# Patient Record
Sex: Female | Born: 1950 | Race: White | Hispanic: No | Smoking: Never smoker
Health system: Southern US, Community
[De-identification: ages and names within clinical notes are randomized; demographics above are authoritative.]

## PROBLEM LIST (undated history)

## (undated) DIAGNOSIS — Z9889 Other specified postprocedural states: Secondary | ICD-10-CM

## (undated) DIAGNOSIS — F0390 Unspecified dementia without behavioral disturbance: Secondary | ICD-10-CM

## (undated) DIAGNOSIS — R112 Nausea with vomiting, unspecified: Secondary | ICD-10-CM

## (undated) DIAGNOSIS — I10 Essential (primary) hypertension: Secondary | ICD-10-CM

## (undated) DIAGNOSIS — E785 Hyperlipidemia, unspecified: Secondary | ICD-10-CM

---

## 2020-03-23 ENCOUNTER — Other Ambulatory Visit: Payer: Self-pay

## 2020-03-23 ENCOUNTER — Encounter (HOSPITAL_COMMUNITY): Payer: Self-pay | Admitting: Emergency Medicine

## 2020-03-23 ENCOUNTER — Emergency Department (HOSPITAL_COMMUNITY)
Admission: EM | Admit: 2020-03-23 | Discharge: 2020-03-29 | Disposition: A | Discharge status: Other Acute Inpatient Hospital | Payer: Medicare HMO | Attending: Emergency Medicine | Admitting: Emergency Medicine

## 2020-03-23 DIAGNOSIS — F69 Unspecified disorder of adult personality and behavior: Secondary | ICD-10-CM | POA: Diagnosis not present

## 2020-03-23 DIAGNOSIS — F329 Major depressive disorder, single episode, unspecified: Secondary | ICD-10-CM | POA: Insufficient documentation

## 2020-03-23 DIAGNOSIS — Z20822 Contact with and (suspected) exposure to covid-19: Secondary | ICD-10-CM | POA: Insufficient documentation

## 2020-03-23 DIAGNOSIS — R110 Nausea: Secondary | ICD-10-CM | POA: Insufficient documentation

## 2020-03-23 DIAGNOSIS — R451 Restlessness and agitation: Secondary | ICD-10-CM | POA: Insufficient documentation

## 2020-03-23 DIAGNOSIS — R413 Other amnesia: Secondary | ICD-10-CM | POA: Diagnosis not present

## 2020-03-23 LAB — COMPREHENSIVE METABOLIC PANEL
ALT: 18 U/L (ref 0–44)
AST: 25 U/L (ref 15–41)
Albumin: 4.2 g/dL (ref 3.5–5.0)
Alkaline Phosphatase: 91 U/L (ref 38–126)
Anion gap: 7 (ref 5–15)
BUN: 14 mg/dL (ref 8–23)
CO2: 24 mmol/L (ref 22–32)
Calcium: 9 mg/dL (ref 8.9–10.3)
Chloride: 108 mmol/L (ref 98–111)
Creatinine, Ser: 0.88 mg/dL (ref 0.44–1.00)
GFR calc Af Amer: >60 mL/min (ref >60)
GFR calc non Af Amer: >60 mL/min (ref >60)
Glucose, Bld: 173 mg/dL — ABNORMAL HIGH (ref 70–99)
Potassium: 3.5 mmol/L (ref 3.5–5.1)
Sodium: 139 mmol/L (ref 135–145)
Total Bilirubin: 0.9 mg/dL (ref 0.3–1.2)
Total Protein: 7.6 g/dL (ref 6.5–8.1)

## 2020-03-23 LAB — CBC WITH DIFFERENTIAL/PLATELET
Abs Immature Granulocytes: 0.02 10*3/uL (ref 0.00–0.07)
Basophils Absolute: 0.1 10*3/uL (ref 0.0–0.1)
Basophils Relative: 1 %
Eosinophils Absolute: 0.1 10*3/uL (ref 0.0–0.5)
Eosinophils Relative: 2 %
HCT: 40.8 % (ref 36.0–46.0)
Hemoglobin: 12.7 g/dL (ref 12.0–15.0)
Immature Granulocytes: 0 %
Lymphocytes Relative: 14 %
Lymphs Abs: 1.2 10*3/uL (ref 0.7–4.0)
MCH: 28.4 pg (ref 26.0–34.0)
MCHC: 31.1 g/dL (ref 30.0–36.0)
MCV: 91.3 fL (ref 80.0–100.0)
Monocytes Absolute: 0.3 10*3/uL (ref 0.1–1.0)
Monocytes Relative: 4 %
Neutro Abs: 6.7 10*3/uL (ref 1.7–7.7)
Neutrophils Relative %: 79 %
Platelets: 323 10*3/uL (ref 150–400)
RBC: 4.47 MIL/uL (ref 3.87–5.11)
RDW: 14 % (ref 11.5–15.5)
WBC: 8.5 10*3/uL (ref 4.0–10.5)
nRBC: 0 % (ref 0.0–0.2)

## 2020-03-23 LAB — URINALYSIS, ROUTINE W REFLEX MICROSCOPIC
Bacteria, UA: NONE SEEN
Bilirubin Urine: NEGATIVE
Glucose, UA: NEGATIVE mg/dL
Ketones, ur: NEGATIVE mg/dL
Nitrite: NEGATIVE
Protein, ur: NEGATIVE mg/dL
Specific Gravity, Urine: 1.008 (ref 1.005–1.030)
pH: 6 (ref 5.0–8.0)

## 2020-03-23 LAB — RAPID URINE DRUG SCREEN, HOSP PERFORMED
Amphetamines: NOT DETECTED
Barbiturates: NOT DETECTED
Benzodiazepines: NOT DETECTED
Cocaine: NOT DETECTED
Opiates: NOT DETECTED
Tetrahydrocannabinol: NOT DETECTED

## 2020-03-23 LAB — ETHANOL: Alcohol, Ethyl (B): <10 mg/dL (ref <10)

## 2020-03-23 MED ORDER — LORAZEPAM 1 MG PO TABS
1.0000 mg | ORAL_TABLET | Freq: Once | ORAL | Status: AC
  Administered 2020-03-23: 1 mg via ORAL
  Filled 2020-03-23: qty 1

## 2020-03-23 MED ORDER — BACITRACIN ZINC 500 UNIT/GM EX OINT
TOPICAL_OINTMENT | Freq: Two times a day (BID) | CUTANEOUS | Status: DC
  Administered 2020-03-25 – 2020-03-29 (×6): 1 via TOPICAL
  Filled 2020-03-23 (×7): qty 0.9

## 2020-03-23 NOTE — ED Notes (Signed)
Pt ambulatory from triage 

## 2020-03-23 NOTE — ED Provider Notes (Signed)
[facility] DEPT Provider Note   CSN: [id number] Arrival date & time: 03/23/20  1122     History Chief Complaint  Patient presents with  . Memory Loss    Nancy [name] is a 69 y.o. female.  HPI       69 year old female, with PMH of depression, presents due to behavioral problems.  Per son at bedside patient has had behavioral problems for the last 9 months.  She has been seen and evaluated for this multiple times.  There is concern for dementia.  Patient was seen by neurology and told that her symptoms were secondary to depression.  Per son patient was on multiple medications including citalopram, trazodone, Zyprexa.  All of these medications have been stopped due to patient "throwing them out."  Son notes that they were having difficulty eating care to the patient so she was placed in assisted living.  However she has left the facility twice in the last 24 hours stating that she wants to see her son.  Patient denies any SI, HI.  Patient denies any medical complaints.  Concern for patient safety by son secondary to patient continuing to leave facility.   History reviewed. No pertinent past medical history.  There are no problems to display for this patient.   History reviewed. No pertinent surgical history.   OB History   No obstetric history on file.     No family history on file.  Social History   Tobacco Use  . Smoking status: Not on file  Substance Use Topics  . Alcohol use: Never  . Drug use: Never    Home Medications Prior to Admission medications   Not on File    Allergies    Patient has no allergy information on record.  Review of Systems   Review of Systems  Constitutional: Negative for chills and fever.  Respiratory: Negative for shortness of breath.   Cardiovascular: Negative for chest pain.  Gastrointestinal: Negative for abdominal pain, nausea and vomiting.  All other systems reviewed and are  negative.   Physical Exam Updated Vital Signs BP (!) 141/73   Pulse 91   Temp 97.9 F (36.6 C) (Oral)   Resp 16   Ht 5' 7.5" (1.715 m)   Wt 54.4 kg   SpO2 99%   BMI 18.52 kg/m   Physical Exam Vitals and nursing note reviewed.  Constitutional:      Appearance: She is well-developed.  HENT:     Head: Normocephalic and atraumatic.  Eyes:     Conjunctiva/sclera: Conjunctivae normal.  Cardiovascular:     Rate and Rhythm: Normal rate and regular rhythm.     Heart sounds: Normal heart sounds. No murmur.  Pulmonary:     Effort: Pulmonary effort is normal. No respiratory distress.     Breath sounds: Normal breath sounds. No wheezing or rales.  Abdominal:     General: Bowel sounds are normal. There is no distension.     Palpations: Abdomen is soft.     Tenderness: There is no abdominal tenderness.  Musculoskeletal:        General: No tenderness or deformity. Normal range of motion.     Cervical back: Neck supple.  Skin:    General: Skin is warm and dry.     Findings: No erythema or rash.       Neurological:     Mental Status: She is alert.     Comments: Oriented to person and place  Psychiatric:  Attention and Perception: Attention normal.        Mood and Affect: Mood normal.        Speech: Speech normal.        Behavior: Behavior is agitated. Behavior is cooperative.     ED Results / Procedures / Treatments   Labs (all labs ordered are listed, but only abnormal results are displayed) Labs Reviewed  RESPIRATORY PANEL BY RT PCR (FLU A&B, COVID)  RAPID URINE DRUG SCREEN, HOSP PERFORMED  COMPREHENSIVE METABOLIC PANEL  ETHANOL  CBC WITH DIFFERENTIAL/PLATELET    EKG None  Radiology No results found.  Procedures Procedures (including critical care time)  Medications Ordered in ED Medications  bacitracin ointment (has no administration in time range)    ED Course  I have reviewed the triage vital signs and the nursing notes.  Pertinent labs &  imaging results that were available during my care of the patient were reviewed by me and considered in my medical decision making (see chart for details).    MDM Rules/Calculators/A&P                       Presents with son for behavior problems.  Son states patient has been walking out into the street leaving nursing facility.  Patient denies any complaints.  Blood work unremarkable.  UDS negative.  UA with leukocyte esterase and hemoglobin present.  Patient denies any urinary symptoms however will send for culture.  TTS consult placed after blood work is resulted.  Multiple attempts made to get a hold of TTS.  After 6 hours they are evaluating patient.  She is medically stable.  Patient placed in psych hold.   The patient has been placed in psychiatric observation due to the need to provide a safe environment for the patient while obtaining psychiatric consultation and evaluation, as well as ongoing medical and medication management to treat the patient's condition.  The patient has not been placed under full IVC at this time.    Final Clinical Impression(s) / ED Diagnoses Final diagnoses:  None    Rx / DC Orders ED Discharge Orders    None       Nancy [name] 03/23/20 2353    Nancy Munch, MD 03/27/20 0040

## 2020-03-23 NOTE — ED Notes (Signed)
Family requesting to speak to PA. PA made aware

## 2020-03-23 NOTE — ED Triage Notes (Signed)
Per pt, states she is "out of sorts"-forgetful and walking out of house with no reason-states her son told her to come to ED and be "checked out"

## 2020-03-23 NOTE — ED Notes (Signed)
Attempted to call TTS for update- no answer.

## 2020-03-23 NOTE — ED Notes (Signed)
I have called 4 different numbers for TTS without answer. Pt is constantly getting up and wandering. She is directable.

## 2020-03-23 NOTE — ED Notes (Signed)
Family at bedside. 

## 2020-03-24 LAB — RESPIRATORY PANEL BY RT PCR (FLU A&B, COVID)
Influenza A by PCR: NEGATIVE
Influenza B by PCR: NEGATIVE
SARS Coronavirus 2 by RT PCR: NEGATIVE

## 2020-03-24 MED ORDER — ZIPRASIDONE MESYLATE 20 MG IM SOLR
20.0000 mg | INTRAMUSCULAR | Status: AC | PRN
  Administered 2020-03-25: 20 mg via INTRAMUSCULAR
  Filled 2020-03-24: qty 20

## 2020-03-24 MED ORDER — TRAZODONE HCL 50 MG PO TABS
50.0000 mg | ORAL_TABLET | Freq: Every day | ORAL | Status: DC
  Administered 2020-03-24 – 2020-03-28 (×5): 50 mg via ORAL
  Filled 2020-03-24 (×5): qty 1

## 2020-03-24 MED ORDER — ACETAMINOPHEN 325 MG PO TABS: 650.0000 mg | ORAL_TABLET | ORAL | Status: DC | PRN

## 2020-03-24 MED ORDER — IBUPROFEN 200 MG PO TABS: 400.0000 mg | ORAL_TABLET | Freq: Four times a day (QID) | ORAL | Status: DC | PRN

## 2020-03-24 MED ORDER — ZOLPIDEM TARTRATE 5 MG PO TABS: 2.5000 mg | ORAL_TABLET | Freq: Every evening | ORAL | Status: DC | PRN

## 2020-03-24 MED ORDER — ONDANSETRON HCL 4 MG PO TABS
4.0000 mg | ORAL_TABLET | Freq: Three times a day (TID) | ORAL | Status: DC | PRN
  Administered 2020-03-24 – 2020-03-26 (×2): 4 mg via ORAL
  Filled 2020-03-24 (×2): qty 1

## 2020-03-24 MED ORDER — RISPERIDONE 1 MG PO TBDP
2.0000 mg | ORAL_TABLET | Freq: Three times a day (TID) | ORAL | Status: DC | PRN
  Administered 2020-03-24 – 2020-03-29 (×4): 2 mg via ORAL
  Filled 2020-03-24 (×4): qty 2

## 2020-03-24 MED ORDER — DONEPEZIL HCL 5 MG PO TABS
10.0000 mg | ORAL_TABLET | Freq: Every day | ORAL | Status: DC
  Administered 2020-03-24 – 2020-03-28 (×5): 10 mg via ORAL
  Filled 2020-03-24 (×6): qty 2

## 2020-03-24 MED ORDER — LORAZEPAM 1 MG PO TABS
1.0000 mg | ORAL_TABLET | ORAL | Status: AC | PRN
  Administered 2020-03-26: 1 mg via ORAL
  Filled 2020-03-24: qty 1

## 2020-03-24 NOTE — ED Notes (Signed)
Pt is very confused and restless.  She keeps trying to walk in hallway and also tries to leave.

## 2020-03-24 NOTE — ED Notes (Signed)
Patient wanded and security placed belongings in patient locker.

## 2020-03-24 NOTE — BH Assessment (Signed)
[facility] Assessment Progress Note  Per Berneice Heinrich, FNP, this pt does not require psychiatric hospitalization at this time.  Pt is psychiatrically cleared.  Behavioral health referrals are not indicated for this pt at this time.  A social work consult has been ordered to address pt's psychosocial needs.  Pt's nurse, Addison Naegeli, has been notified.  Doylene Canning, MA Triage Specialist [phone]

## 2020-03-24 NOTE — NC FL2 (Signed)
  [place] MEDICAID FL2 LEVEL OF CARE SCREENING TOOL     IDENTIFICATION  Patient Name: Nancy [name] Birthdate: February 28, 1951 Sex: female Admission Date (Current Location): 03/23/2020  [facility] and [place] Number:  Producer, television/film/video and Address:  [facility],  [address] [place] [address]      Provider Number: [id number]  Attending Physician Name and Address:  Default, Provider, MD  Relative Name and Phone Number:  NEKESHIA LENHARDT [facility]) [phone]    Current Level of Care: Hospital Recommended Level of Care: Assisted Living Facility, Memory Care Prior Approval Number:    Date Approved/Denied:   PASRR Number:    Discharge Plan: Other (Comment)(ALF/memory care)    Current Diagnoses: There are no problems to display for this patient.   Orientation RESPIRATION BLADDER Height & Weight     Self(flucuates due to dementia)  Normal Continent Weight: 120 lb (54.4 kg) Height:  5' 7.5" (171.5 cm)  BEHAVIORAL SYMPTOMS/MOOD NEUROLOGICAL BOWEL NUTRITION STATUS      Continent Diet(regular)  AMBULATORY STATUS COMMUNICATION OF NEEDS Skin   Supervision Verbally Normal                       Personal Care Assistance Level of Assistance  Bathing, Feeding, Dressing Bathing Assistance: Independent Feeding assistance: Independent Dressing Assistance: Independent     Functional Limitations Info  Sight, Hearing, Speech Sight Info: Adequate Hearing Info: Adequate Speech Info: Adequate    SPECIAL CARE FACTORS FREQUENCY                       Contractures Contractures Info: Not present    Additional Factors Info  Code Status, Allergies Code Status Info: Full Allergies Info: Cefdinir           Current Medications (03/24/2020):  This is the current hospital active medication list Current Facility-Administered Medications  Medication Dose Route Frequency Provider Last Rate Last Admin  . bacitracin ointment   Topical BID Clayborne Artist, [place]    Given at 03/23/20 2231   Current Outpatient Medications  Medication Sig Dispense Refill  . donepezil (ARICEPT) 10 MG tablet Take 10 mg by mouth at bedtime.    Marland Kitchen ibuprofen (ADVIL) 400 MG tablet Take 400 mg by mouth every 6 (six) hours as needed for fever, headache, mild pain, moderate pain or cramping.     . traZODone (DESYREL) 50 MG tablet Take 50 mg by mouth at bedtime.       Discharge Medications: Please see discharge summary for a list of discharge medications.  Relevant Imaging Results:  Relevant Lab Results:   Additional Information SS# [id number]  Montine Circle, LCSW

## 2020-03-24 NOTE — Consult Note (Signed)
[facility] Psych ED Progress Note  03/24/2020 2:32 PM Nancy [name]  MRN:  [id number] Subjective:   Patient seen by nurse practitioner along with Dr Dwyane Dee. Patient alert, oriented to self only at this time. Patient pleasant and cooperative, attempts to participate in evaluation as current level of cognition allows.  Patient states "I would like to go wherever my son Catalina Antigua wants me to be." Patient suggest this writer contact her son to discuss disposition.  Social work spoke with patient's son who endorses difficulty caring for patient as she wanders from his home.  Patient recently placed in independent living exhibiting wandering behaviors from this facility as well.  Principal Problem: <principal problem not specified> Diagnosis:  Active Problems:   * No active hospital problems. *  Total Time spent with patient: 30 minutes  Past Psychiatric History: Depression  Past Medical History: History reviewed. No pertinent past medical history. History reviewed. No pertinent surgical history. Family History: No family history on file. Family Psychiatric  History: Unknown Social History:  Social History   Substance and Sexual Activity  Alcohol Use Never     Social History   Substance and Sexual Activity  Drug Use Never    Social History   Socioeconomic History  . Marital status: Single    Spouse name: Not on file  . Number of children: Not on file  . Years of education: Not on file  . Highest education level: Not on file  Occupational History  . Not on file  Tobacco Use  . Smoking status: Not on file  Substance and Sexual Activity  . Alcohol use: Never  . Drug use: Never  . Sexual activity: Not on file  Other Topics Concern  . Not on file  Social History Narrative  . Not on file   Social Determinants of Health   Financial Resource Strain:   . Difficulty of Paying Living Expenses:   Food Insecurity:   . Worried About Charity fundraiser in the Last Year:   . Arboriculturist  in the Last Year:   Transportation Needs:   . Film/video editor (Medical):   Marland Kitchen Lack of Transportation (Non-Medical):   Physical Activity:   . Days of Exercise per Week:   . Minutes of Exercise per Session:   Stress:   . Feeling of Stress :   Social Connections:   . Frequency of Communication with Friends and Family:   . Frequency of Social Gatherings with Friends and Family:   . Attends Religious Services:   . Active Member of Clubs or Organizations:   . Attends Archivist Meetings:   Marland Kitchen Marital Status:     Sleep: Fair  Appetite:  Fair  Current Medications: Current Facility-Administered Medications  Medication Dose Route Frequency Provider Last Rate Last Admin  . bacitracin ointment   Topical BID Etter Sjogren, [place]   Given at 03/24/20 1100   Current Outpatient Medications  Medication Sig Dispense Refill  . donepezil (ARICEPT) 10 MG tablet Take 10 mg by mouth at bedtime.    Marland Kitchen ibuprofen (ADVIL) 400 MG tablet Take 400 mg by mouth every 6 (six) hours as needed for fever, headache, mild pain, moderate pain or cramping.     . traZODone (DESYREL) 50 MG tablet Take 50 mg by mouth at bedtime.      Lab Results:  Results for orders placed or performed during the hospital encounter of 03/23/20 (from the past 48 hour(s))  Comprehensive metabolic panel  Status: Abnormal   Collection Time: 03/23/20  4:30 PM  Result Value Ref Range   Sodium 139 135 - 145 mmol/L   Potassium 3.5 3.5 - 5.1 mmol/L   Chloride 108 98 - 111 mmol/L   CO2 24 22 - 32 mmol/L   Glucose, Bld 173 (H) 70 - 99 mg/dL    Comment: Glucose reference range applies only to samples taken after fasting for at least 8 hours.   BUN 14 8 - 23 mg/dL   Creatinine, Ser 7.56 0.44 - 1.00 mg/dL   Calcium 9.0 8.9 - 43.3 mg/dL   Total Protein 7.6 6.5 - 8.1 g/dL   Albumin 4.2 3.5 - 5.0 g/dL   AST 25 15 - 41 U/L   ALT 18 0 - 44 U/L   Alkaline Phosphatase 91 38 - 126 U/L   Total Bilirubin 0.9 0.3 - 1.2 mg/dL    GFR calc non Af Amer >60 >60 mL/min   GFR calc Af Amer >60 >60 mL/min   Anion gap 7 5 - 15    Comment: Performed at [facility], [address] [place], [place] [address]  Ethanol     Status: None   Collection Time: 03/23/20  4:30 PM  Result Value Ref Range   Alcohol, Ethyl (B) <10 <10 mg/dL    Comment: (NOTE) Lowest detectable limit for serum alcohol is 10 mg/dL. For medical purposes only. Performed at [facility], [address], [place], [place] [address]   CBC with Diff     Status: None   Collection Time: 03/23/20  4:30 PM  Result Value Ref Range   WBC 8.5 4.0 - 10.5 K/uL   RBC 4.47 3.87 - 5.11 MIL/uL   Hemoglobin 12.7 12.0 - 15.0 g/dL   HCT 06.3 01.6 - 01.0 %   MCV 91.3 80.0 - 100.0 fL   MCH 28.4 26.0 - 34.0 pg   MCHC 31.1 30.0 - 36.0 g/dL   RDW 93.2 35.5 - 73.2 %   Platelets 323 150 - 400 K/uL   nRBC 0.0 0.0 - 0.2 %   Neutrophils Relative % 79 %   Neutro Abs 6.7 1.7 - 7.7 K/uL   Lymphocytes Relative 14 %   Lymphs Abs 1.2 0.7 - 4.0 K/uL   Monocytes Relative 4 %   Monocytes Absolute 0.3 0.1 - 1.0 K/uL   Eosinophils Relative 2 %   Eosinophils Absolute 0.1 0.0 - 0.5 K/uL   Basophils Relative 1 %   Basophils Absolute 0.1 0.0 - 0.1 K/uL   Immature Granulocytes 0 %   Abs Immature Granulocytes 0.02 0.00 - 0.07 K/uL    Comment: Performed at [facility], [address] [place], [place] [address]  Urinalysis, Routine w reflex microscopic     Status: Abnormal   Collection Time: 03/23/20  4:48 PM  Result Value Ref Range   Color, Urine YELLOW YELLOW   APPearance CLEAR CLEAR   Specific Gravity, Urine 1.008 1.005 - 1.030   pH 6.0 5.0 - 8.0   Glucose, UA NEGATIVE NEGATIVE mg/dL   Hgb urine dipstick MODERATE (A) NEGATIVE   Bilirubin Urine NEGATIVE NEGATIVE   Ketones, ur NEGATIVE NEGATIVE mg/dL   Protein, ur NEGATIVE NEGATIVE mg/dL   Nitrite NEGATIVE NEGATIVE   Leukocytes,Ua SMALL (A) NEGATIVE   RBC / HPF 0-5 0  - 5 RBC/hpf   WBC, UA 11-20 0 - 5 WBC/hpf   Bacteria, UA NONE SEEN NONE SEEN   Squamous Epithelial /  LPF 0-5 0 - 5    Comment: Performed at [facility], [address], [place], [place] [address]  Urine rapid drug screen (hosp performed)     Status: None   Collection Time: 03/23/20  4:56 PM  Result Value Ref Range   Opiates NONE DETECTED NONE DETECTED   Cocaine NONE DETECTED NONE DETECTED   Benzodiazepines NONE DETECTED NONE DETECTED   Amphetamines NONE DETECTED NONE DETECTED   Tetrahydrocannabinol NONE DETECTED NONE DETECTED   Barbiturates NONE DETECTED NONE DETECTED    Comment: (NOTE) DRUG SCREEN FOR MEDICAL PURPOSES ONLY.  IF CONFIRMATION IS NEEDED FOR ANY PURPOSE, NOTIFY LAB WITHIN 5 DAYS. LOWEST DETECTABLE LIMITS FOR URINE DRUG SCREEN Drug Class                     Cutoff (ng/mL) Amphetamine and metabolites    1000 Barbiturate and metabolites    200 Benzodiazepine                 200 Tricyclics and metabolites     300 Opiates and metabolites        300 Cocaine and metabolites        300 THC                            50 Performed at [facility], [address] [place], [place] [address]   Respiratory Panel by RT PCR (Flu A&B, Covid) - Nasopharyngeal Swab     Status: None   Collection Time: 03/24/20  5:07 AM   Specimen: Nasopharyngeal Swab  Result Value Ref Range   SARS Coronavirus 2 by RT PCR NEGATIVE NEGATIVE    Comment: (NOTE) SARS-CoV-2 target nucleic acids are NOT DETECTED. The SARS-CoV-2 RNA is generally detectable in upper respiratoy specimens during the acute phase of infection. The lowest concentration of SARS-CoV-2 viral copies this assay can detect is 131 copies/mL. A negative result does not preclude SARS-Cov-2 infection and should not be used as the sole basis for treatment or other patient management decisions. A negative result may occur with  improper specimen collection/handling, submission of specimen  other than nasopharyngeal swab, presence of viral mutation(s) within the areas targeted by this assay, and inadequate number of viral copies (<131 copies/mL). A negative result must be combined with clinical observations, patient history, and epidemiological information. The expected result is Negative. Fact Sheet for Patients:  https://www.moore.com/ Fact Sheet for Healthcare Providers:  https://www.young.biz/ This test is not yet ap proved or cleared by the [place] FDA and  has been authorized for detection and/or diagnosis of SARS-CoV-2 by FDA under an Emergency Use Authorization (EUA). This EUA will remain  in effect (meaning this test can be used) for the duration of the COVID-19 declaration under Section 564(b)(1) of the Act, 21 U.S.C. section 360bbb-3(b)(1), unless the authorization is terminated or revoked sooner.    Influenza A by PCR NEGATIVE NEGATIVE   Influenza B by PCR NEGATIVE NEGATIVE    Comment: (NOTE) The Xpert Xpress SARS-CoV-2/FLU/RSV assay is intended as an aid in  the diagnosis of influenza from Nasopharyngeal swab specimens and  should not be used as a sole basis for treatment. Nasal washings and  aspirates are unacceptable for Xpert Xpress SARS-CoV-2/FLU/RSV  testing. Fact Sheet for Patients: https://www.moore.com/ Fact Sheet for Healthcare Providers: https://www.young.biz/ This test is not yet approved or cleared by the [place] FDA and  has been authorized for detection  and/or diagnosis of SARS-CoV-2 by  FDA under an Emergency Use Authorization (EUA). This EUA will remain  in effect (meaning this test can be used) for the duration of the  Covid-19 declaration under Section 564(b)(1) of the Act, 21  U.S.C. section 360bbb-3(b)(1), unless the authorization is  terminated or revoked. Performed at [facility], [address] [place], [place] [address]      Blood Alcohol level:  Lab Results  Component Value Date   ETH <10 03/23/2020    Physical Findings: AIMS:  , ,  ,  ,    CIWA:    COWS:     Musculoskeletal: Strength & Muscle Tone: within normal limits Gait & Station: unble to assess Patient leans: N/A  Psychiatric Specialty Exam: Physical Exam Vitals and nursing note reviewed.  Constitutional:      Appearance: She is well-developed.  HENT:     Head: Normocephalic.  Cardiovascular:     Rate and Rhythm: Normal rate.  Pulmonary:     Effort: Pulmonary effort is normal.  Neurological:     Mental Status: She is alert. She is disoriented.  Psychiatric:        Mood and Affect: Mood normal.        Behavior: Behavior normal.        Thought Content: Thought content normal.     Review of Systems  Constitutional: Negative.   HENT: Negative.   Eyes: Negative.   Respiratory: Negative.   Cardiovascular: Negative.   Gastrointestinal: Negative.   Genitourinary: Negative.   Musculoskeletal: Negative.   Skin: Negative.   Neurological: Negative.     Blood pressure 119/63, pulse 81, temperature 97.9 F (36.6 C), temperature source Oral, resp. rate 16, height 5' 7.5" (1.715 m), weight 54.4 kg, SpO2 98 %.Body mass index is 18.52 kg/m.  General Appearance: Casual and Fairly Groomed  Eye Contact:  Fair  Speech:  Clear and Coherent and Normal Rate  Volume:  Normal  Mood:  Euthymic  Affect:  Congruent  Thought Process:  Goal Directed and Descriptions of Associations: Intact  Orientation:  Other:  self only  Thought Content:  Logical  Suicidal Thoughts:  No  Homicidal Thoughts:  No  Memory:  Immediate;   Poor Recent;   Poor Remote;   Poor  Judgement:  Impaired  Insight:  Lacking  Psychomotor Activity:  Normal  Concentration:  Concentration: Fair and Attention Span: Fair  Recall:  Poor  Fund of Knowledge:  Fair  Language:  Good  Akathisia:  No  Handed:  Right  AIMS (if indicated):     Assets:  Communication  Skills Desire for Improvement Financial Resources/Insurance Housing Intimacy Leisure Time Physical Health Resilience Social Support  ADL's:  Intact  Cognition:  Impaired,  Moderate  Sleep:         Treatment Plan Summary: Plan to clear by psychiatry. Social work to attempt to secure appropriate placement.   Patrcia Dolly, FNP 03/24/2020, 2:32 PM

## 2020-03-24 NOTE — TOC Initial Note (Signed)
Transition of Care [facility]) - Initial/Assessment Note    Patient Details  Name: Nancy [name] MRN: [id number] Date of Birth: [birth year]  Transition of Care [facility]) CM/SW Contact:    Montine Circle, LCSW Phone Number: 03/24/2020, 1:20 PM  Clinical Narrative:                 [facility] consult received. Per TTS patient has been psych cleared. CSW spoke with patient's son Susy Frizzle, who reports he is hoping to get his mother in to an ALF. He reports he has already been in contact with [facility] and reports they need an FL2 and other clinical information. Susy Frizzle also reports he would like to get his mother in a safe place as she has had several episodes of wandering within the past week. Matt reports he was living at an independent living apartment, but wandered off and she was [place] police [place] the [place] on two [place]. Due to safety concerns she is not able to return there. He also reports since living with him patient has also tried to wander as well.   CSW made contact with [facility] and will send over referral information.    Expected Discharge Plan: Assisted Living Barriers to Discharge: No Barriers Identified   Patient Goals and CMS Choice Patient states their goals for this hospitalization and ongoing recovery are:: Per son "to have my mom somewhere safe."      Expected Discharge Plan and Services Expected Discharge Plan: Assisted Living In-house Referral: Clinical Social Work     Living arrangements for the past 2 months: Independent Living Facility                                      Prior Living Arrangements/Services Living arrangements for the past 2 months: Independent Living Facility Lives with:: Self Patient language and need for interpreter reviewed:: Yes Do you feel safe going back to the place where you live?: Yes      Need for Family Participation in Patient Care: Yes (Comment) Care giver support system in place?: Yes (comment)   Criminal  Activity/Legal Involvement Pertinent to Current Situation/Hospitalization: No - Comment as needed  Activities of Daily Living   ADL Screening (condition at time of admission) Patient's cognitive ability adequate to safely complete daily activities?: No Patient able to express need for assistance with ADLs?: No Independently performs ADLs?: Yes (appropriate for developmental age)  Permission Sought/Granted Permission sought to share information with : Facility Medical sales representative    Share Information with NAME: Ayde Record  Permission granted to share info w AGENCY: [employer]  Permission granted to share info w Relationship: son  Permission granted to share info w Contact Information: [phone]  Emotional Assessment Appearance:: Appears stated age Attitude/Demeanor/Rapport: Unable to Assess Affect (typically observed): Unable to Assess Orientation: : Fluctuating Orientation (Suspected and/or reported Sundowners) Alcohol / Substance Use: Not Applicable Psych Involvement: No (comment)  Admission diagnosis:  AMS There are no problems to display for this patient.  PCP:  System, Pcp Not In Pharmacy:   [id number] - [address] [place] [address] Phone: [phone] Fax: [phone]     Social Determinants of Health (SDOH) Interventions    Readmission Risk Interventions No flowsheet data found.

## 2020-03-24 NOTE — ED Notes (Signed)
Pt began throwing up, Olivia NT Sitter, was in the room and helping with pt. RN notified.

## 2020-03-24 NOTE — ED Notes (Signed)
Patient items:  One watch, silver in color. One stackable ring, silver-colored and yellow.  One silver-colored ring.

## 2020-03-24 NOTE — ED Notes (Signed)
Pt clothes placed in patient belongings bag behind nurses station. Bag contains pt jacket, shirt, pants, shoes, and specimen bag with patients jewelry in it. Pt belonging bag placed behind nurses station.

## 2020-03-24 NOTE — BH Assessment (Signed)
Tele Assessment Note   Patient Name: Nancy [name] MRN: [id number] Referring Physician: Nonda Lou, PA Location of Patient: [facility] Location of Provider: [place] Department  Nancy [name] is an 69 y.o. female.  -Clinician reviewed note from [place], [place].  Pt is a 69 year old female, with PMH of depression, presents due to behavioral problems.  Per son at bedside patient has had behavioral problems for the last 9 months.  She has been seen and evaluated for this multiple times.  There is concern for dementia.  Patient was seen by neurology and told that her symptoms were secondary to depression.  Per son patient was on multiple medications including citalopram, trazodone, Zyprexa.  All of these medications have been stopped due to patient "throwing them out."  Son notes that they were having difficulty taking care of the patient so she was placed in assisted living.  However she has left the facility twice in the last 24 hours stating that she wants to see her son.  Patient denies any SI, HI.  Patient denies any medical complaints.  Concern for patient safety by son secondary to patient continuing to leave facility.    Clinician talked briefly with patient.  She was asked if she knew where she was, she didn't.  She thought that she was at a home in [place].  Patient was asked how she got to [facility] and she responded that "I walked here."  Pt is not oriented to time, place or situation.  Patient denies SI, HI or A/V hallucinations.  She has fair eye contact.  She does not appear to be responding to internal stimuli.  Patient thought process is not coherent.  Patient does is not able to make rational choices regarding her safety and well-being at this time.  Pt reportedly has been eloping from the facility she was placed in.  Clinician did get verbal permission from mother to contact her son Rodman Key.  Clinician did attempt to contact him but his voicemail was full.    -Based  on patient safety, Talbot Grumbling, NP recommended geropsych placement.  TTS to seek placement.    Diagnosis: Dementia  Past Medical History: History reviewed. No pertinent past medical history.  History reviewed. No pertinent surgical history.  Family History: No family history on file.  Social History:  reports that she does not drink alcohol or use drugs. No history on file for tobacco.  Additional Social History:  Alcohol / Drug Use Pain Medications: See PTA medication list Prescriptions: See PTA medication list Over the Counter: See PTA medication list History of alcohol / drug use?: No history of alcohol / drug abuse  CIWA: CIWA-Ar BP: (!) 141/73 Pulse Rate: 91 COWS:    Allergies:  Allergies  Allergen Reactions  . Cefdinir Diarrhea    Patient states this medication makes her feel weird as well as having diarrhea    Home Medications: (Not in a hospital admission)   OB/GYN Status:  No LMP recorded.  General Assessment Data Location of Assessment: [facility] TTS Assessment: In system Is this a Tele or Face-to-Face Assessment?: Tele Assessment Is this an Initial Assessment or a Re-assessment for this encounter?: Initial Assessment Patient Accompanied by:: N/A Language Other than English: No Living Arrangements: In Assisted Living/Nursing Home (Comment: Name of Nursing Home What gender do you identify as?: Female Marital status: Other (comment)(Unknown.) Pregnancy Status: No Living Arrangements: Other (Comment)(Pt is in an assisted living facility.) Can pt return to current living arrangement?: Yes(Unknown) Admission Status: Voluntary  Is patient capable of signing voluntary admission?: No Referral Source: Self/Family/Friend Insurance type: [employer]     Crisis Care Plan Living Arrangements: Other (Comment)(Pt is in an assisted living facility.) Name of Psychiatrist: No Name of Therapist: None  Education Status Is patient currently in school?: No Is the patient  employed, unemployed or receiving disability?: Unemployed(Retired)  Risk to self with the past 6 months Suicidal Ideation: No Has patient been a risk to self within the past 6 months prior to admission? : No Suicidal Intent: No Has patient had any suicidal intent within the past 6 months prior to admission? : No Is patient at risk for suicide?: No Suicidal Plan?: No Has patient had any suicidal plan within the past 6 months prior to admission? : No Access to Means: No What has been your use of drugs/alcohol within the last 12 months?: None Previous Attempts/Gestures: (Unknown) How many times?: (Unknown) Other Self Harm Risks: Dementia Triggers for Past Attempts: None known Intentional Self Injurious Behavior: None Family Suicide History: Unknown Recent stressful life event(s): Turmoil (Comment)(Pt moved into a facility recently.) Persecutory voices/beliefs?: No Depression: Yes Depression Symptoms: Despondent Substance abuse history and/or treatment for substance abuse?: No Suicide prevention information given to non-admitted patients: Not applicable  Risk to Others within the past 6 months Homicidal Ideation: No Does patient have any lifetime risk of violence toward others beyond the six months prior to admission? : No Thoughts of Harm to Others: No Current Homicidal Intent: No Current Homicidal Plan: No Access to Homicidal Means: No Identified Victim: No one History of harm to others?: No Assessment of Violence: None Noted Violent Behavior Description: Unknown Does patient have access to weapons?: No Criminal Charges Pending?: No Does patient have a court date: No Is patient on probation?: No  Psychosis Hallucinations: None noted Delusions: None noted  Mental Status Report Appearance/Hygiene: Unremarkable Eye Contact: Fair Motor Activity: Freedom of movement Speech: Incoherent Level of Consciousness: Alert Mood: Anxious Affect: Anxious Anxiety Level:  Minimal Thought Processes: Irrelevant, Flight of Ideas Judgement: Impaired Orientation: Not oriented Obsessive Compulsive Thoughts/Behaviors: None  Cognitive Functioning Concentration: Poor Memory: Recent Impaired, Remote Impaired Is patient IDD: No Insight: Poor Impulse Control: Poor Appetite: (UTA) Have you had any weight changes? : (UTA) Sleep: Unable to Assess Total Hours of Sleep: (Unknown) Vegetative Symptoms: None  ADLScreening [facility] Assessment Services) Patient's cognitive ability adequate to safely complete daily activities?: No Patient able to express need for assistance with ADLs?: No Independently performs ADLs?: Yes (appropriate for developmental age)  Prior Inpatient Therapy Prior Inpatient Therapy: (Unknown)  Prior Outpatient Therapy Prior Outpatient Therapy: (Unknown)  ADL Screening (condition at time of admission) Patient's cognitive ability adequate to safely complete daily activities?: No Patient able to express need for assistance with ADLs?: No Independently performs ADLs?: Yes (appropriate for developmental age)             Merchant navy officer (For Healthcare) Does Patient Have a Medical Advance Directive?: (Unknown.)          Disposition:  Disposition Initial Assessment Completed for this Encounter: Yes Patient referred to: Other (Comment)(TTS to seek placement.)  This service was provided via telemedicine using a 2-way, interactive audio and video technology.  Names of all persons participating in this telemedicine service and their role in this encounter. Name: Aiana Nordquist Role: patient  Name: Beatriz Stallion, M.S. LCAS QP Role: clinician  Name:  Role:   Name:  Role:     Alexandria Lodge 03/24/2020 12:43 AM

## 2020-03-24 NOTE — BH Assessment (Signed)
[facility] Assessment Progress Note   Clinician called pt's son, Molli Hazard.  Unable to leave a message due to voicemail being full.  Informed nurse [place] that pt had been seen and attempt made to contact family.

## 2020-03-25 LAB — URINE CULTURE: Culture: NO GROWTH

## 2020-03-25 MED ORDER — FAMOTIDINE 20 MG PO TABS
20.0000 mg | ORAL_TABLET | Freq: Two times a day (BID) | ORAL | Status: DC
  Administered 2020-03-25 – 2020-03-29 (×6): 20 mg via ORAL
  Filled 2020-03-25 (×6): qty 1

## 2020-03-25 NOTE — Progress Notes (Signed)
03/25/2020  1736  Per patient's son please give Trazodone with food. Pt has been know to vomit if Trazodone is given on a empty stomach.

## 2020-03-25 NOTE — ED Notes (Signed)
Patient constantly walking out of her room demending to leave and unable to direct.  Refused to take PO med.  Geodon IM given. Patient resting comfortably, sitter in sight.

## 2020-03-25 NOTE — ED Notes (Signed)
Patient is up and asking acid reflux med.

## 2020-03-26 MED ORDER — STERILE WATER FOR INJECTION IJ SOLN
INTRAMUSCULAR | Status: AC
  Administered 2020-03-26: 1.2 mL
  Filled 2020-03-26: qty 10

## 2020-03-26 MED ORDER — ZIPRASIDONE MESYLATE 20 MG IM SOLR
10.0000 mg | Freq: Once | INTRAMUSCULAR | Status: AC
  Administered 2020-03-26: 10 mg via INTRAMUSCULAR
  Filled 2020-03-26: qty 20

## 2020-03-26 NOTE — Progress Notes (Signed)
03/26/2020  1500  Patient is agitated. Throwing lotion bottles at staff, cursing at staff, trying to leave room.

## 2020-03-26 NOTE — Progress Notes (Signed)
CSW continues to seeking ALF placement for patient. This CSW sent patient's clinical information to [facility] on 03/24/2020. CSW called to follow up on receipt of information. Information was received, but the Administrator does not work on the weekend. CSW will follow up on Monday 4/26.  Geralyn Corwin, LCSW Transitions of Care Department [facility] ED [phone]

## 2020-03-26 NOTE — Progress Notes (Signed)
03/26/2020  1804  Notified MD that patient has had three episodes of vomiting. 1st episode after eating breakfast, 2nd after seeing her lunch, and 3rd after eating a few bites of food. Waiting for response/order.

## 2020-03-26 NOTE — ED Provider Notes (Signed)
  Physical Exam  BP (!) 125/54 (BP Location: Left Arm)   Pulse 80   Temp 98 F (36.7 C) (Oral)   Resp 20   Ht 5' 7.5" (1.715 m)   Wt 54.4 kg   SpO2 98%   BMI 18.52 kg/m   Physical Exam  ED Course/Procedures     Procedures  MDM  Patient is become more agitated.  Will not take orals.  Will give Geodon.       Benjiman Core, MD 03/26/20 1440

## 2020-03-27 LAB — SARS CORONAVIRUS 2 (TAT 6-24 HRS): SARS Coronavirus 2: NEGATIVE

## 2020-03-27 MED ORDER — TUBERCULIN PPD 5 UNIT/0.1ML ID SOLN
5.0000 [IU] | INTRADERMAL | Status: AC
  Administered 2020-03-27: 5 [IU] via INTRADERMAL
  Filled 2020-03-27: qty 0.1

## 2020-03-27 MED ORDER — ONDANSETRON 4 MG PO TBDP
4.0000 mg | ORAL_TABLET | Freq: Three times a day (TID) | ORAL | Status: DC | PRN
  Administered 2020-03-27 – 2020-03-29 (×4): 4 mg via ORAL
  Filled 2020-03-27 (×4): qty 1

## 2020-03-27 NOTE — ED Provider Notes (Signed)
Patient has been psychiatrically cleared and still awaiting placement.  When I assessed the patient, nursing reports that she has been having some dry heaving with nausea for the last few days.  When I assessed the patient, I offered labs to look for dehydration or electrolyte imbalance, nausea medicine, and EKG.  Patient refused the labs and nausea medicine but did want EKG.  EKG did not show prolonged QTC.  Patient did not let me listen to her heart or lungs or assess her physically.  Externally, she was resting with her vomit bag in her hands.  She did not want me to assess her.  Otherwise patient is still awaiting placement.     Aravind Chrismer, Canary Brim, MD 03/27/20 [phone]

## 2020-03-27 NOTE — ED Notes (Signed)
Patient given Zofran, currently resting in bed quietly.

## 2020-03-27 NOTE — Progress Notes (Addendum)
1:20p  CSW spoke with Britta Mccreedy, Production designer, theatre/television/film at [facility] about patient. Britta Mccreedy reports they will complete an assessment with patient via Facetime today at 3p. CSW to facilitate this.   9:16a CSW contacted [facility] for an update on patient's referral. CSW left a message with the administrator and is currently waiting on a call back.   Geralyn Corwin, LCSW Transitions of Care Department [facility] ED [phone]

## 2020-03-27 NOTE — ED Notes (Signed)
Patient witnessed dry heaving this morning. Reports that she has been nauseous for 3 days. MD notified. Patient refused labs and nausea medication but did let writer get an EKG. [place] notified.

## 2020-03-27 NOTE — Progress Notes (Signed)
Assessment with [facility] completed. Patient can be accepted there Wednesday morning 03/29/2020. Patient will need a TB test and COVID test prior to discharge. CSW to make EDP and RN aware of needed orders.   CSW contacted patient's son and let him know that patient has been accepted to [facility]. Patient's son reports he plans on coming up here to visit patient today to talk with her about the transition. Patient's son to follow up with [facility] to sign needed paperwork.   Geralyn Corwin, LCSW Transitions of Care Department [facility] ED [phone]

## 2020-03-27 NOTE — Progress Notes (Signed)
Received Nancy [name] asleep in her bed at the change of shift, she continued to sleep throughout the evening. She was compliant with her VS check and medications. The sitter remains at the bedside throughout the night.

## 2020-03-28 NOTE — Progress Notes (Signed)
CSW confirmed with EDP a diagnosis of dementia could not be done from the ED as this is an outpatient process, usually from a neurologist, and that the FL-2 auto-populates from the chart that [facility] has access too.  CSW will continue to follow for D/C needs.  Dorothe Pea. Quanita Barona  MSW, LCSW, LCAS, CSI Transitions of Care Clinical Social Worker Care Coordination Department Ph: [phone]

## 2020-03-28 NOTE — ED Notes (Signed)
Patient reports being nauseous.

## 2020-03-28 NOTE — Progress Notes (Addendum)
CSW called pt's son who was the [facility] at the time of the CSW's call.  CSW spoke to pt's son who then put Nancy [name] the [facility] on the phone who states that the 1st shift CSW was told by admissions that in order for the pt to be accepted to [facility] ALF the pt would have to have a diagnosis of Dementia on the FL-2 and that this would the primary diagnosis.  The notes do not reflect that the 1st shift ED CSW was told this and the 2nd shift ED CSW told Nancy [name] the admin at the [facility] that.  Further, 2nd shift ED CSW stated that the FL-2 auto-populates from the chart and is merely a reflection of the chart and as such a diagnosis is not something that the CSW (1st or 2nd shift) could "add on to" the pt's chart.  CSW further stated that the pt's chart shows no current diagnosis of dementia and the notes from the chart only indicate a "concern for dementia" from the pt's son, per the note from the ED PA.  CSW stated to pt's son/[facility]F Admin he would confirm that with the pt's EDP.  CSW will continue to follow for D/C needs.  Nancy [name]  MSW, LCSW, LCAS, CSI Transitions of Care Clinical Social Worker Care Coordination Department Ph: [phone]

## 2020-03-28 NOTE — Progress Notes (Addendum)
2nd shift ED CSW received a handoff from the 1st shift [facility] ED CSW.   Pt's son is to have completed the paperwork at the [facility] ALF (see CSW note from 4/26) in order for the pt to be accepted at the [facility] ALF on the morning of Wed 03/29/20.  CSW at the request of the pt's son asked the EDP to review notes from [place] to look for a diagnosis there in their records.  EDP looked and saw a mention of dementia as a listed, "problem:, but not a diagnosis.  Further, there was no mention of a diagnosis of dementia in an office visit to a Neurologist but only a sentence reading that, "dementia cannot be ruled out".  CSW will continue to follow for D/C needs.  Dorothe Pea. Graylon Amory  MSW, LCSW, LCAS, CSI Transitions of Care Clinical Social Worker Care Coordination Department Ph: [phone]

## 2020-03-28 NOTE — ED Provider Notes (Signed)
Emergency Medicine Observation Re-evaluation Note  Nancy [name] is a 69 y.o. female, seen on rounds today.  Pt initially presented to the ED for complaints of Memory Loss Currently, the patient is to be placed today.  Physical Exam  BP (!) 145/66 (BP Location: Left Arm)   Pulse 91   Temp 97.9 F (36.6 C) (Oral)   Resp 17   Ht 1.715 m (5' 7.5")   Wt 54.4 kg   SpO2 96%   BMI 18.52 kg/m  Physical Exam  ED Course / MDM  EKG:EKG Interpretation  Date/Time:  Monday March 27 2020 09:50:29 EDT Ventricular Rate:  83 PR Interval:  156 QRS Duration: 84 QT Interval:  374 QTC Calculation: 439 R Axis:   75 Text Interpretation: Normal sinus rhythm Normal ECG No prior ECG for comparison. NO STEMI Confirmed by Theda Belfast ([id number]) on 03/27/2020 11:04:16 AM    I have reviewed the labs performed to date as well as medications administered while in observation.  Recent changes in the last 24 hours include none. Plan  Current plan is for placement. Patient is not under full IVC at this time.   Lorre Nick, MD 03/28/20 1023

## 2020-03-28 NOTE — ED Notes (Signed)
Spoke with Christiane Ha CSW regarding plan for pt. He has been made aware no update has been added to chart since yesterday. Christiane Ha will follow up and let CN know POC.

## 2020-03-28 NOTE — ED Notes (Signed)
Patient pleasant at this time, sitting in chair by bed

## 2020-03-28 NOTE — Progress Notes (Signed)
CSW updated pt's son who stated he can get the pt's needed FL-2 (with dementia diagnosis) from pt's PCP.  CSW will continue to follow for D/C needs.  Dorothe Pea. Ivianna Notch  MSW, LCSW, LCAS, CSI Transitions of Care Clinical Social Worker Care Coordination Department Ph: [phone]

## 2020-03-29 NOTE — Progress Notes (Signed)
03/29/2020  1448  Read TB skin test: Neg result 71mm

## 2020-03-29 NOTE — ED Provider Notes (Addendum)
Emergency Medicine Observation Re-evaluation Note  Nancy [name] is a 69 y.o. female, seen on rounds today.  Pt initially presented to the ED for complaints of Memory Loss Currently, the patient is awaiting placement. No complaints from the nursing staff.  Physical Exam  BP 140/66 (BP Location: Right Arm)   Pulse 75   Temp 98 F (36.7 C) (Oral)   Resp 17   Ht 5' 7.5" (1.715 m)   Wt 54.4 kg   SpO2 95%   BMI 18.52 kg/m  Physical Exam Vitals and nursing note reviewed.  HENT:     Head: Atraumatic.  Cardiovascular:     Rate and Rhythm: Normal rate.  Pulmonary:     Effort: Pulmonary effort is normal.  Skin:    Capillary Refill: Capillary refill takes less than 2 seconds.  Neurological:     Mental Status: Mental status is at baseline.     ED Course / MDM  EKG:EKG Interpretation  Date/Time:  Monday March 27 2020 09:50:29 EDT Ventricular Rate:  83 PR Interval:  156 QRS Duration: 84 QT Interval:  374 QTC Calculation: 439 R Axis:   75 Text Interpretation: Normal sinus rhythm Normal ECG No prior ECG for comparison. NO STEMI Confirmed by Theda Belfast ([id number]) on 03/27/2020 11:04:16 AM    I have reviewed the labs performed to date as well as medications administered while in observation.  Recent changes in the last 24 hours include : None. Plan  Current plan is for continue with placement. Patient is under full IVC at this time.   Derwood Kaplan, MD 03/29/20 0736   3:17 PM Patient's evaluation has been completed.  TTS recommends discharge to facility.   Derwood Kaplan, MD 03/29/20 1517

## 2020-03-29 NOTE — Progress Notes (Addendum)
2:19p CSW spoke with patient's son who reports the documentation for patient has been completed and faxed to [facility]. Patient's son on his way to pick up patient.   CSW spoke with Britta Mccreedy at [facility] who confirmed that she did receive the FL2 from the PCP and patient can be admitted. CSW faxed over COVID results and TB reading to [facility]. EDP and RN aware of patient's discharge to [facility].  10:30a CSW spoke with patient's son who reports patient's PCP will complete the documentation for the dementia dx. He reports this should be done by noon. Matt reports he will come pick patient up.   9:06a CSW spoke with patient's son, Susy Frizzle, regarding getting documentation of patient's dementia dx from her PCP office. Matt reports he contacted the PCP this morning and is awaiting a response from them. CSW will continue to follow up.  Geralyn Corwin, LCSW Transitions of Care Department [facility] ED [phone]

## 2020-08-30 ENCOUNTER — Emergency Department (HOSPITAL_COMMUNITY): Payer: Medicare HMO

## 2020-08-30 ENCOUNTER — Other Ambulatory Visit: Payer: Self-pay

## 2020-08-30 ENCOUNTER — Emergency Department (HOSPITAL_COMMUNITY)
Admission: EM | Admit: 2020-08-30 | Discharge: 2020-08-30 | Disposition: A | Discharge status: Skilled Nursing Facility | Payer: Medicare HMO | Attending: Emergency Medicine | Admitting: Emergency Medicine

## 2020-08-30 DIAGNOSIS — S42291A Other displaced fracture of upper end of right humerus, initial encounter for closed fracture: Secondary | ICD-10-CM | POA: Diagnosis not present

## 2020-08-30 DIAGNOSIS — S199XXA Unspecified injury of neck, initial encounter: Secondary | ICD-10-CM | POA: Diagnosis not present

## 2020-08-30 DIAGNOSIS — R05 Cough: Secondary | ICD-10-CM | POA: Diagnosis not present

## 2020-08-30 DIAGNOSIS — W1839XA Other fall on same level, initial encounter: Secondary | ICD-10-CM | POA: Diagnosis not present

## 2020-08-30 DIAGNOSIS — S62101A Fracture of unspecified carpal bone, right wrist, initial encounter for closed fracture: Secondary | ICD-10-CM

## 2020-08-30 DIAGNOSIS — S4991XA Unspecified injury of right shoulder and upper arm, initial encounter: Secondary | ICD-10-CM | POA: Diagnosis present

## 2020-08-30 DIAGNOSIS — S42294A Other nondisplaced fracture of upper end of right humerus, initial encounter for closed fracture: Secondary | ICD-10-CM

## 2020-08-30 DIAGNOSIS — S0990XA Unspecified injury of head, initial encounter: Secondary | ICD-10-CM | POA: Diagnosis not present

## 2020-08-30 LAB — CBC WITH DIFFERENTIAL/PLATELET
Abs Immature Granulocytes: 0.06 10*3/uL (ref 0.00–0.07)
Basophils Absolute: 0 10*3/uL (ref 0.0–0.1)
Basophils Relative: 0 %
Eosinophils Absolute: 0.1 10*3/uL (ref 0.0–0.5)
Eosinophils Relative: 0 %
HCT: 34.3 % — ABNORMAL LOW (ref 36.0–46.0)
Hemoglobin: 11.1 g/dL — ABNORMAL LOW (ref 12.0–15.0)
Immature Granulocytes: 0 %
Lymphocytes Relative: 8 %
Lymphs Abs: 1.1 10*3/uL (ref 0.7–4.0)
MCH: 28.8 pg (ref 26.0–34.0)
MCHC: 32.4 g/dL (ref 30.0–36.0)
MCV: 88.9 fL (ref 80.0–100.0)
Monocytes Absolute: 0.6 10*3/uL (ref 0.1–1.0)
Monocytes Relative: 4 %
Neutro Abs: 12.4 10*3/uL — ABNORMAL HIGH (ref 1.7–7.7)
Neutrophils Relative %: 88 %
Platelets: 254 10*3/uL (ref 150–400)
RBC: 3.86 MIL/uL — ABNORMAL LOW (ref 3.87–5.11)
RDW: 13.6 % (ref 11.5–15.5)
WBC: 14.3 10*3/uL — ABNORMAL HIGH (ref 4.0–10.5)
nRBC: 0 % (ref 0.0–0.2)

## 2020-08-30 LAB — BASIC METABOLIC PANEL
Anion gap: 7 (ref 5–15)
BUN: 14 mg/dL (ref 8–23)
CO2: 21 mmol/L — ABNORMAL LOW (ref 22–32)
Calcium: 7.9 mg/dL — ABNORMAL LOW (ref 8.9–10.3)
Chloride: 111 mmol/L (ref 98–111)
Creatinine, Ser: 0.78 mg/dL (ref 0.44–1.00)
GFR calc Af Amer: >60 mL/min (ref >60)
GFR calc non Af Amer: >60 mL/min (ref >60)
Glucose, Bld: 116 mg/dL — ABNORMAL HIGH (ref 70–99)
Potassium: 3.4 mmol/L — ABNORMAL LOW (ref 3.5–5.1)
Sodium: 139 mmol/L (ref 135–145)

## 2020-08-30 MED ORDER — FENTANYL CITRATE (PF) 100 MCG/2ML IJ SOLN
50.0000 ug | Freq: Once | INTRAMUSCULAR | Status: AC
  Administered 2020-08-30: 50 ug via INTRAVENOUS
  Filled 2020-08-30: qty 2

## 2020-08-30 NOTE — Discharge Instructions (Addendum)
follow up with orthopaedics about humerus fracture/wrist fracture. Please keep in sling, and do not get the splint wet.  Dont bear any weight with right arm.  Recommend Tylenol Motrin for pain.  Suspect nonoperative management.  Follow-up with orthopedics in a week and call them for appointment.

## 2020-08-30 NOTE — ED Triage Notes (Signed)
Pt. Arrived by Ems A&Ox1 (baseline) Mechanical fall  No LOC/ No blood thinners. Pt. was hypotensive. Ems put in a 20G Lhand, 1 liter of fluid. Complaining of right shoulder pain and diarrhea .

## 2020-08-30 NOTE — ED Notes (Signed)
[facility] called for transport.  

## 2020-08-30 NOTE — ED Provider Notes (Signed)
[facility]-EMERGENCY DEPT Provider Note   CSN: [id number] Arrival date & time: 08/30/20  0175     History Chief Complaint  Patient presents with  . Fall    Nancy [name] is a 69 y.o. female.  The history is provided by the patient.  Fall This is a new problem. The problem occurs rarely. The problem has been resolved. Pertinent negatives include no chest pain, no abdominal pain, no headaches and no shortness of breath. Associated symptoms comments: Right shoulder, wrist. Nothing aggravates the symptoms. Nothing relieves the symptoms. She has tried nothing for the symptoms.       No past medical history on file.  There are no problems to display for this patient.   No past surgical history on file.   OB History   No obstetric history on file.     No family history on file.  Social History   Tobacco Use  . Smoking status: Not on file  Substance Use Topics  . Alcohol use: Never  . Drug use: Never    Home Medications Prior to Admission medications   Medication Sig Start Date End Date Taking? Authorizing Provider  citalopram (CELEXA) 20 MG tablet Take 20 mg by mouth daily. 07/20/20  Yes [provider]  ibuprofen (ADVIL) 400 MG tablet Take 400 mg by mouth every 6 (six) hours as needed for fever, headache, mild pain, moderate pain or cramping.  02/03/20  Yes [provider]  OLANZapine (ZYPREXA) 10 MG tablet Take 10 mg by mouth at bedtime. 08/17/20  Yes [provider]  PRESCRIPTION MEDICATION Take 1 each by mouth in the morning, at noon, and at bedtime. Mighty Shakes   Yes [provider]    Allergies    Cefdinir  Review of Systems   Review of Systems  Constitutional: Negative for chills and fever.  HENT: Negative for ear pain and sore throat.   Eyes: Negative for pain and visual disturbance.  Respiratory: Negative for cough and shortness of breath.   Cardiovascular: Negative for chest pain and  palpitations.  Gastrointestinal: Negative for abdominal pain and vomiting.  Genitourinary: Negative for dysuria and hematuria.  Musculoskeletal: Positive for arthralgias. Negative for back pain.  Skin: Negative for color change and rash.  Neurological: Negative for seizures, syncope and headaches.  All other systems reviewed and are negative.   Physical Exam Updated Vital Signs  ED Triage Vitals [08/30/20 0700]  Enc Vitals Group     BP 116/68     Pulse Rate 93     Resp 18     Temp 97.9 F (36.6 C)     Temp Source Oral     SpO2 98 %     Weight      Height      Head Circumference      Peak Flow      Pain Score      Pain Loc      Pain Edu?      Excl. in GC?     Physical Exam Vitals and nursing note reviewed.  Constitutional:      General: She is not in acute distress.    Appearance: She is well-developed. She is not ill-appearing.  HENT:     Head: Normocephalic and atraumatic.     Nose: Nose normal.     Mouth/Throat:     Mouth: Mucous membranes are moist.  Eyes:     Extraocular Movements: Extraocular movements intact.     Conjunctiva/sclera:  Conjunctivae normal.     Pupils: Pupils are equal, round, and reactive to light.  Cardiovascular:     Rate and Rhythm: Normal rate and regular rhythm.     Pulses: Normal pulses.     Heart sounds: Normal heart sounds. No murmur heard.   Pulmonary:     Effort: Pulmonary effort is normal. No respiratory distress.     Breath sounds: Normal breath sounds.  Abdominal:     Palpations: Abdomen is soft.     Tenderness: There is no abdominal tenderness.  Musculoskeletal:        General: Tenderness (TTP right shoulder, right wrist) present.     Cervical back: Normal range of motion and neck supple. No tenderness.     Comments: Pain with ROM of right shoulder   Skin:    General: Skin is warm and dry.     Capillary Refill: Capillary refill takes less than 2 seconds.  Neurological:     General: No focal deficit present.     Mental  Status: She is alert.     Cranial Nerves: No cranial nerve deficit.     Sensory: No sensory deficit.     Motor: No weakness.  Psychiatric:        Mood and Affect: Mood normal.     ED Results / Procedures / Treatments   Labs (all labs ordered are listed, but only abnormal results are displayed) Labs Reviewed  CBC WITH DIFFERENTIAL/PLATELET - Abnormal; Notable for the following components:      Result Value   WBC 14.3 (*)    RBC 3.86 (*)    Hemoglobin 11.1 (*)    HCT 34.3 (*)    Neutro Abs 12.4 (*)    All other components within normal limits  BASIC METABOLIC PANEL - Abnormal; Notable for the following components:   Potassium 3.4 (*)    CO2 21 (*)    Glucose, Bld 116 (*)    Calcium 7.9 (*)    All other components within normal limits    EKG None  Radiology DG Chest 2 View  Result Date: 08/30/2020 CLINICAL DATA:  Cough RIGHT shoulder and wrist pain post fall EXAM: CHEST - 2 VIEW COMPARISON:  None FINDINGS: Image rotated to the LEFT. Accounting for this cardiomediastinal contours and hilar structures are normal. No consolidation or sign of effusion. Osteopenia. Impacted fracture of the RIGHT humeral neck better seen on dedicated humeral and shoulder radiographs. IMPRESSION: Impacted fracture of the RIGHT humeral neck with comminution better seen on dedicated radiographs. No acute cardiopulmonary disease. Electronically Signed   By: Donzetta KohutGeoffrey  Wile M.D.   On: 08/30/2020 08:28   DG Pelvis 1-2 Views  Result Date: 08/30/2020 CLINICAL DATA:  RIGHT shoulder and RIGHT wrist pain post fall EXAM: PELVIS - 1-2 VIEW COMPARISON:  None FINDINGS: There is no evidence of pelvic fracture or diastasis. No pelvic bone lesions are seen. IMPRESSION: Negative. Electronically Signed   By: Donzetta KohutGeoffrey  Wile M.D.   On: 08/30/2020 08:33   DG Shoulder Right  Result Date: 08/30/2020 CLINICAL DATA:  Right shoulder pain after fall EXAM: RIGHT SHOULDER - 2+ VIEW; RIGHT HUMERUS - 2+ VIEW COMPARISON:  None.  FINDINGS: Comminuted fracture of the proximal right humerus with mildly impacted and anteriorly displaced component through the surgical neck. Nondisplaced component involves the greater tuberosity. Alignment of the glenohumeral joint appears maintained without dislocation. AC joint intact. Limited view of the elbow joint reveals no gross deformity. There is soft tissue swelling at the fracture  site. IMPRESSION: Comminuted mildly displaced fracture of the proximal right humerus involving the surgical neck and greater tuberosity. Electronically Signed   By: Duanne Guess D.O.   On: 08/30/2020 08:25   DG Wrist Complete Right  Result Date: 08/30/2020 CLINICAL DATA:  RIGHT shoulder and RIGHT wrist pain post fall EXAM: RIGHT WRIST - COMPLETE 3+ VIEW COMPARISON:  None. FINDINGS: Osteopenia. Impacted dorsally angulated and comminuted fracture of the distal radius extending into the radioulnar joint and radiocarpal articulation. Mild dorsal tilt of the radial articular surface. No additional fracture. IMPRESSION: 1. Impacted, comminuted and dorsally angulated fracture of the distal radius extending into the radioulnar joint and radiocarpal articulation. 2. Osteopenia. Electronically Signed   By: Donzetta Kohut M.D.   On: 08/30/2020 08:26   CT Head Wo Contrast  Result Date: 08/30/2020 CLINICAL DATA:  Head trauma, minor. Neck trauma. Additional history provided: Fall, pain to head and neck. EXAM: CT HEAD WITHOUT CONTRAST CT CERVICAL SPINE WITHOUT CONTRAST TECHNIQUE: Multidetector CT imaging of the head and cervical spine was performed following the standard protocol without intravenous contrast. Multiplanar CT image reconstructions of the cervical spine were also generated. COMPARISON:  No pertinent prior exams are available for comparison. FINDINGS: CT HEAD FINDINGS Brain: Mild generalized cerebral atrophy. Mild ill-defined hypoattenuation within the cerebral white matter is nonspecific, but consistent with  chronic small vessel ischemic disease. There is no acute intracranial hemorrhage. No demarcated cortical infarct. No extra-axial fluid collection. No evidence of intracranial mass. No midline shift. Vascular: No hyperdense vessel. Skull: Normal. Negative for fracture or focal lesion. Sinuses/Orbits: Visualized orbits show no acute finding. Mild ethmoid and maxillary sinus mucosal thickening. No significant mastoid effusion. CT CERVICAL SPINE FINDINGS Alignment: Straightening of the expected cervical lordosis. Trace C2-C3 grade 1 retrolisthesis. Trace C4-C5 grade 1 anterolisthesis. Trace C5-C6 grade 1 retrolisthesis. Skull base and vertebrae: The basion-dental and atlanto-dental intervals are maintained.No evidence of acute fracture to the cervical spine. Soft tissues and spinal canal: No prevertebral fluid or swelling. No visible canal hematoma. Disc levels: Cervical spondylosis with multilevel disc space narrowing, posterior disc osteophytes, uncovertebral and facet hypertrophy. Disc space narrowing is moderate/advanced at C5-C6 and C6-C7. Posterior disc osteophytes at C5-C6 and C6-C7 contribute to at least moderate bony spinal canal stenosis. Multilevel bony neural foraminal narrowing. Upper chest: No consolidation within the imaged lung apices. No visible pneumothorax. IMPRESSION: CT head: 1. No evidence of acute intracranial abnormality. 2. Mild generalized cerebral atrophy and chronic small vessel ischemic disease. 3. Mild ethmoid and maxillary sinus mucosal thickening. CT cervical spine: 1. No evidence of acute fracture to the cervical spine. 2. Trace multilevel spondylolisthesis as detailed. 3. Cervical spondylosis as described and greatest at C5-C6 and C6-C7. Electronically Signed   By: Jackey Loge DO   On: 08/30/2020 08:04   CT Cervical Spine Wo Contrast  Result Date: 08/30/2020 CLINICAL DATA:  Head trauma, minor. Neck trauma. Additional history provided: Fall, pain to head and neck. EXAM: CT HEAD  WITHOUT CONTRAST CT CERVICAL SPINE WITHOUT CONTRAST TECHNIQUE: Multidetector CT imaging of the head and cervical spine was performed following the standard protocol without intravenous contrast. Multiplanar CT image reconstructions of the cervical spine were also generated. COMPARISON:  No pertinent prior exams are available for comparison. FINDINGS: CT HEAD FINDINGS Brain: Mild generalized cerebral atrophy. Mild ill-defined hypoattenuation within the cerebral white matter is nonspecific, but consistent with chronic small vessel ischemic disease. There is no acute intracranial hemorrhage. No demarcated cortical infarct. No extra-axial fluid collection. No evidence of  intracranial mass. No midline shift. Vascular: No hyperdense vessel. Skull: Normal. Negative for fracture or focal lesion. Sinuses/Orbits: Visualized orbits show no acute finding. Mild ethmoid and maxillary sinus mucosal thickening. No significant mastoid effusion. CT CERVICAL SPINE FINDINGS Alignment: Straightening of the expected cervical lordosis. Trace C2-C3 grade 1 retrolisthesis. Trace C4-C5 grade 1 anterolisthesis. Trace C5-C6 grade 1 retrolisthesis. Skull base and vertebrae: The basion-dental and atlanto-dental intervals are maintained.No evidence of acute fracture to the cervical spine. Soft tissues and spinal canal: No prevertebral fluid or swelling. No visible canal hematoma. Disc levels: Cervical spondylosis with multilevel disc space narrowing, posterior disc osteophytes, uncovertebral and facet hypertrophy. Disc space narrowing is moderate/advanced at C5-C6 and C6-C7. Posterior disc osteophytes at C5-C6 and C6-C7 contribute to at least moderate bony spinal canal stenosis. Multilevel bony neural foraminal narrowing. Upper chest: No consolidation within the imaged lung apices. No visible pneumothorax. IMPRESSION: CT head: 1. No evidence of acute intracranial abnormality. 2. Mild generalized cerebral atrophy and chronic small vessel ischemic  disease. 3. Mild ethmoid and maxillary sinus mucosal thickening. CT cervical spine: 1. No evidence of acute fracture to the cervical spine. 2. Trace multilevel spondylolisthesis as detailed. 3. Cervical spondylosis as described and greatest at C5-C6 and C6-C7. Electronically Signed   By: Jackey Loge DO   On: 08/30/2020 08:04   DG Humerus Right  Result Date: 08/30/2020 CLINICAL DATA:  Right shoulder pain after fall EXAM: RIGHT SHOULDER - 2+ VIEW; RIGHT HUMERUS - 2+ VIEW COMPARISON:  None. FINDINGS: Comminuted fracture of the proximal right humerus with mildly impacted and anteriorly displaced component through the surgical neck. Nondisplaced component involves the greater tuberosity. Alignment of the glenohumeral joint appears maintained without dislocation. AC joint intact. Limited view of the elbow joint reveals no gross deformity. There is soft tissue swelling at the fracture site. IMPRESSION: Comminuted mildly displaced fracture of the proximal right humerus involving the surgical neck and greater tuberosity. Electronically Signed   By: Duanne Guess D.O.   On: 08/30/2020 08:25    Procedures Procedures (including critical care time)  Medications Ordered in ED Medications  fentaNYL (SUBLIMAZE) injection 50 mcg (has no administration in time range)    ED Course  I have reviewed the triage vital signs and the nursing notes.  Pertinent labs & imaging results that were available during my care of the patient were reviewed by me and considered in my medical decision making (see chart for details).    MDM Rules/Calculators/A&P                          Nancy [name] is a 69 year old female with history of dementia who presents to the ED after mechanical fall.  Patient with unremarkable vitals.  No fever.  Blood pressure was in the 90s with EMS so they gave her a liter of fluid.  She has had some diarrhea.  Complaining mostly of right shoulder and right wrist pain.  Does not know if she lost  consciousness.  She is not on blood thinners.  She appears neurologically intact.  No obvious deformity to the right shoulder but she is tender at the proximal humerus and right wrist.  We will get images of the shoulder, wrist, head, neck, chest, pelvis.  Will check basic labs given recent diarrhea to make sure no electrolyte abnormalities or AKI.  Patient already got fluid bolus.  Patient with comminuted right wrist fracture, fracture of proximal humerus.  Talked with Dr. Ave Filter with orthopedics  will place patient in a sling with a sugar tong splint for the rest.  Will follow up in 1 week.  Suspect nonoperative treatment.  Lab work overall unremarkable.  Discharge back to facility.  This chart was dictated using voice recognition software.  Despite best efforts to proofread,  errors can occur which can change the documentation meaning.    Final Clinical Impression(s) / ED Diagnoses Final diagnoses:  Closed fracture of right wrist, initial encounter  Other closed nondisplaced fracture of proximal end of right humerus, initial encounter    Rx / DC Orders ED Discharge Orders    None       Virgina Norfolk, DO 08/30/20 4098

## 2021-05-26 ENCOUNTER — Emergency Department (HOSPITAL_BASED_OUTPATIENT_CLINIC_OR_DEPARTMENT_OTHER): Payer: Medicare HMO

## 2021-05-26 ENCOUNTER — Emergency Department (HOSPITAL_BASED_OUTPATIENT_CLINIC_OR_DEPARTMENT_OTHER)
Admission: EM | Admit: 2021-05-26 | Discharge: 2021-05-26 | Disposition: A | Discharge status: Home / Self Care | Payer: Medicare HMO | Attending: Emergency Medicine | Admitting: Emergency Medicine

## 2021-05-26 ENCOUNTER — Encounter (HOSPITAL_BASED_OUTPATIENT_CLINIC_OR_DEPARTMENT_OTHER): Payer: Self-pay

## 2021-05-26 DIAGNOSIS — Z23 Encounter for immunization: Secondary | ICD-10-CM | POA: Insufficient documentation

## 2021-05-26 DIAGNOSIS — S01111A Laceration without foreign body of right eyelid and periocular area, initial encounter: Secondary | ICD-10-CM | POA: Insufficient documentation

## 2021-05-26 DIAGNOSIS — F0390 Unspecified dementia without behavioral disturbance: Secondary | ICD-10-CM | POA: Diagnosis not present

## 2021-05-26 DIAGNOSIS — S0990XA Unspecified injury of head, initial encounter: Secondary | ICD-10-CM

## 2021-05-26 DIAGNOSIS — W19XXXA Unspecified fall, initial encounter: Secondary | ICD-10-CM | POA: Diagnosis not present

## 2021-05-26 DIAGNOSIS — S0181XA Laceration without foreign body of other part of head, initial encounter: Secondary | ICD-10-CM

## 2021-05-26 MED ORDER — LIDOCAINE HCL (PF) 1 % IJ SOLN
5.0000 mL | Freq: Once | INTRAMUSCULAR | Status: AC
  Administered 2021-05-26: 5 mL
  Filled 2021-05-26: qty 5

## 2021-05-26 MED ORDER — TETANUS-DIPHTH-ACELL PERTUSSIS 5-2.5-18.5 LF-MCG/0.5 IM SUSY
0.5000 mL | PREFILLED_SYRINGE | Freq: Once | INTRAMUSCULAR | Status: AC
  Administered 2021-05-26: 0.5 mL via INTRAMUSCULAR
  Filled 2021-05-26: qty 0.5

## 2021-05-26 NOTE — Discharge Instructions (Addendum)
The sutures are absorbable but can be removed in 3 to 5 days.

## 2021-05-26 NOTE — ED Notes (Signed)
Pt assisted up to bathroom. Incont of urine in her brief. incont care provided and brief changed. Assisted back to bed. Tolerated well.

## 2021-05-26 NOTE — ED Notes (Addendum)
Patient from skilled Nursing facility.  Report unwitnessed fall substaning laceration above right eye brow.  About 1 inch in length.

## 2021-05-26 NOTE — ED Notes (Signed)
Handoff report given to [place] at [facility].  Patient to transport back to facility via POV with son.

## 2021-05-26 NOTE — ED Provider Notes (Signed)
[facility] EMERGENCY DEPT Provider Note   CSN: [id number] Arrival date & time: 05/26/21  1646     History Chief Complaint  Patient presents with   Fall    Lac. At right eyebrow    Nancy [name] is a 70 y.o. female.   Fall Level 5 caveat due to dementia.  From skilled nursing.  Reportedly fell.  Unsure what happened.  Approximately 2 cm laceration above right eye.  Patient only complaining of that laceration.  Denies other pain but does not know what happened.  Not on anticoagulation.  Unknown last tetanus.     No past medical history on file.  There are no problems to display for this patient.   No past surgical history on file.   OB History   No obstetric history on file.     No family history on file.  Social History   Substance Use Topics   Alcohol use: Never   Drug use: Never    Home Medications Prior to Admission medications   Medication Sig Start Date End Date Taking? Authorizing Provider  citalopram (CELEXA) 20 MG tablet Take 20 mg by mouth daily. 07/20/20   [provider]  ibuprofen (ADVIL) 400 MG tablet Take 400 mg by mouth every 6 (six) hours as needed for fever, headache, mild pain, moderate pain or cramping.  02/03/20   [provider]  OLANZapine (ZYPREXA) 10 MG tablet Take 10 mg by mouth at bedtime. 08/17/20   [provider]  PRESCRIPTION MEDICATION Take 1 each by mouth in the morning, at noon, and at bedtime. Mighty Shakes    [provider]    Allergies    Cefdinir  Review of Systems   Review of Systems  Unable to perform ROS: Dementia   Physical Exam Updated Vital Signs BP 136/68 (BP Location: Right Arm)   Pulse 85   Temp 98.6 F (37 C) (Oral)   Resp 16   SpO2 99%   Physical Exam Vitals and nursing note reviewed.  HENT:     Head:     Comments: Approximately 2 cm laceration above right eye horizontally.  Eye movements intact. Eyes:     Extraocular Movements: Extraocular  movements intact.  Cardiovascular:     Rate and Rhythm: Regular rhythm.  Pulmonary:     Breath sounds: No wheezing or rhonchi.  Chest:     Chest wall: No tenderness.  Abdominal:     Tenderness: There is no abdominal tenderness.  Musculoskeletal:        General: No tenderness.     Cervical back: Neck supple.  Skin:    General: Skin is warm.     Capillary Refill: Capillary refill takes less than 2 seconds.  Neurological:     Mental Status: She is alert. Mental status is at baseline.    ED Results / Procedures / Treatments   Labs (all labs ordered are listed, but only abnormal results are displayed) Labs Reviewed - No data to display  EKG None  Radiology No results found.  Procedures .Marland KitchenLaceration Repair  Date/Time: 05/26/2021 7:50 PM Performed by: Benjiman Core, MD Authorized by: Benjiman Core, MD   Consent:    Consent obtained:  Verbal   Consent given by:  Patient   Risks, benefits, and alternatives were discussed: yes     Risks discussed:  Infection, need for additional repair, nerve damage and retained foreign body   Alternatives discussed:  No treatment Anesthesia:    Anesthesia method:  Local infiltration   Local anesthetic:  Lidocaine 1% w/o epi Laceration details:    Location:  Face   Face location:  R eyebrow   Length (cm):  2 Pre-procedure details:    Preparation:  Patient was prepped and draped in usual sterile fashion and imaging obtained to evaluate for foreign bodies Exploration:    Limited defect created (wound extended): no     Hemostasis achieved with:  Direct pressure   Wound exploration: wound explored through full range of motion     Contaminated: no   Treatment:    Area cleansed with:  Shur-Clens   Amount of cleaning:  Standard   Visualized foreign bodies/material removed: no     Scar revision: no   Skin repair:    Repair method:  Sutures   Suture size:  5-0   Wound skin closure material used: vicryl rapide.   Suture technique:   Simple interrupted   Number of sutures:  6 Approximation:    Approximation:  Close Repair type:    Repair type:  Simple Post-procedure details:    Dressing:  Sterile dressing   Procedure completion:  Tolerated well, no immediate complications   Medications Ordered in ED Medications  lidocaine (PF) (XYLOCAINE) 1 % injection 5 mL (has no administration in time range)  Tdap (BOOSTRIX) injection 0.5 mL (has no administration in time range)    ED Course  I have reviewed the triage vital signs and the nursing notes.  Pertinent labs & imaging results that were available during my care of the patient were reviewed by me and considered in my medical decision making (see chart for details).    MDM Rules/Calculators/A&P                          Patient presented after a fall at the memory care unit.  Unsure exactly what happened but only complaint is laceration on her head.  Motor cheek with no underlying bony tenderness.  Wound closed.  Head CT reassuring.  Discussed with patient and her son.  Appears at baseline.  Discharge home with son back to [place] [facility]. Final Clinical Impression(s) / ED Diagnoses Final diagnoses:  None    Rx / DC Orders ED Discharge Orders     None        Benjiman Core, MD 05/26/21 2308

## 2021-05-26 NOTE — ED Triage Notes (Signed)
She presented to the nurse's station at [facility], telling the staff that "I fell". They noted her to have a right eyebrow area lac. And called EMS. She is awake, alert and confused at her baseline.

## 2021-06-19 ENCOUNTER — Encounter (HOSPITAL_BASED_OUTPATIENT_CLINIC_OR_DEPARTMENT_OTHER): Payer: Self-pay | Admitting: *Deleted

## 2021-06-19 ENCOUNTER — Other Ambulatory Visit: Payer: Self-pay

## 2021-06-19 ENCOUNTER — Emergency Department (HOSPITAL_BASED_OUTPATIENT_CLINIC_OR_DEPARTMENT_OTHER)
Admission: EM | Admit: 2021-06-19 | Discharge: 2021-06-19 | Disposition: A | Discharge status: Home / Self Care | Payer: Medicare HMO | Attending: Emergency Medicine | Admitting: Emergency Medicine

## 2021-06-19 ENCOUNTER — Emergency Department (HOSPITAL_BASED_OUTPATIENT_CLINIC_OR_DEPARTMENT_OTHER): Payer: Medicare HMO

## 2021-06-19 DIAGNOSIS — F0390 Unspecified dementia without behavioral disturbance: Secondary | ICD-10-CM | POA: Insufficient documentation

## 2021-06-19 DIAGNOSIS — S00212A Abrasion of left eyelid and periocular area, initial encounter: Secondary | ICD-10-CM | POA: Diagnosis not present

## 2021-06-19 DIAGNOSIS — I10 Essential (primary) hypertension: Secondary | ICD-10-CM | POA: Insufficient documentation

## 2021-06-19 DIAGNOSIS — S0990XA Unspecified injury of head, initial encounter: Secondary | ICD-10-CM

## 2021-06-19 DIAGNOSIS — W19XXXA Unspecified fall, initial encounter: Secondary | ICD-10-CM | POA: Diagnosis not present

## 2021-06-19 HISTORY — DX: Essential (primary) hypertension: I10

## 2021-06-19 HISTORY — DX: Unspecified dementia, unspecified severity, without behavioral disturbance, psychotic disturbance, mood disturbance, and anxiety: F03.90

## 2021-06-19 HISTORY — DX: Hyperlipidemia, unspecified: E78.5

## 2021-06-19 NOTE — ED Notes (Signed)
Pt given mac and cheese.  Left message on son's voice mail for permission to transport pt back to facility with safe transport.

## 2021-06-19 NOTE — ED Provider Notes (Signed)
[facility] EMERGENCY DEPT Provider Note   CSN: [id number] Arrival date & time: 06/19/21  1425     History Chief Complaint  Patient presents with   Fall   Dementia    Nancy [name] is a 70 y.o. female.  HPI   70 year old female presents to the ER from facility for unwitnessed fall and head injury. Patient has h/o dementia, amnesia, HTN, HLD. Report from facility is that patient was unattended briefly and a fall was heard. Patient found on the floor but awake, no s/s syncope or seizure. Patient sustained a head injury, small left eyebrow abrasion. History limited 2/2 dementia, she denies any complaints. Reported to be baseline per facility.   Past Medical History:  Diagnosis Date   Dementia (HCC)    Hyperlipidemia    Hypertension     There are no problems to display for this patient.   History reviewed. No pertinent surgical history.   OB History   No obstetric history on file.     No family history on file.  Social History   Substance Use Topics   Alcohol use: Never   Drug use: Never    Home Medications Prior to Admission medications   Medication Sig Start Date End Date Taking? Authorizing Provider  alum & mag hydroxide-simeth (MAALOX/MYLANTA) 200-200-20 MG/5ML suspension Take 30 mLs by mouth every 6 (six) hours as needed for indigestion or heartburn.   Yes [provider]  citalopram (CELEXA) 10 MG tablet Take 10 mg by mouth daily. 07/20/20  Yes [provider]  divalproex (DEPAKOTE) 125 MG DR tablet Take 125 mg by mouth daily.   Yes [provider]  guaifenesin (ROBITUSSIN) 100 MG/5ML syrup Take 200 mg by mouth 4 (four) times daily as needed for cough.   Yes [provider]  loperamide (IMODIUM) 2 MG capsule Take 2 mg by mouth as needed for diarrhea or loose stools. 1 cap as needed with loose stool/diarrhea,max 8 doses in 24 hrs   Yes [provider]  OLANZapine (ZYPREXA) 10 MG tablet Take 10 mg by  mouth daily. 08/17/20  Yes [provider]  PRESCRIPTION MEDICATION Take 1 each by mouth in the morning, at noon, and at bedtime. Mighty Shakes   Yes [provider]  ibuprofen (ADVIL) 400 MG tablet Take 400 mg by mouth every 6 (six) hours as needed for fever, headache, mild pain, moderate pain or cramping.  02/03/20   [provider]    Allergies    Cefdinir  Review of Systems   Review of Systems  Unable to perform ROS: Dementia   Physical Exam Updated Vital Signs BP 127/61   Pulse 82   Temp 97.9 F (36.6 C) (Oral)   Resp 15   Ht 5' 7.5" (1.715 m)   Wt 54.4 kg   LMP  (LMP Unknown)   SpO2 100%   BMI 18.52 kg/m   Physical Exam Vitals and nursing note reviewed.  Constitutional:      Appearance: Normal appearance.  HENT:     Head: Normocephalic.     Comments: Small superficial left eye brow abrasion    Mouth/Throat:     Mouth: Mucous membranes are moist.  Eyes:     Pupils: Pupils are equal, round, and reactive to light.  Neck:     Comments: Collar in place Cardiovascular:     Rate and Rhythm: Normal rate.  Pulmonary:     Effort: Pulmonary effort is normal. No respiratory distress.  Abdominal:  Palpations: Abdomen is soft.     Tenderness: There is no abdominal tenderness.  Musculoskeletal:        General: No swelling or deformity.     Cervical back: No rigidity or tenderness.  Skin:    General: Skin is warm.  Neurological:     Mental Status: She is alert. Mental status is at baseline.    ED Results / Procedures / Treatments   Labs (all labs ordered are listed, but only abnormal results are displayed) Labs Reviewed - No data to display  EKG EKG Interpretation  Date/Time:  Tuesday June 19 2021 14:58:25 EDT Ventricular Rate:  79 PR Interval:  180 QRS Duration: 82 QT Interval:  382 QTC Calculation: 438 R Axis:   13 Text Interpretation: Sinus rhythm NSR, similar to previous Confirmed by Coralee Pesa [phone]) on 06/19/2021 3:37:53  PM  Radiology CT Head Wo Contrast  Result Date: 06/19/2021 CLINICAL DATA:  Unwitnessed fall, facial laceration. EXAM: CT HEAD WITHOUT CONTRAST CT CERVICAL SPINE WITHOUT CONTRAST TECHNIQUE: Multidetector CT imaging of the head and cervical spine was performed following the standard protocol without intravenous contrast. Multiplanar CT image reconstructions of the cervical spine were also generated. COMPARISON:  May 26, 2021. FINDINGS: CT HEAD FINDINGS Brain: No evidence of acute infarction, hemorrhage, hydrocephalus, extra-axial collection or mass lesion/mass effect. Vascular: No hyperdense vessel or unexpected calcification. Skull: Normal. Negative for fracture or focal lesion. Sinuses/Orbits: No acute finding. Other: None. CT CERVICAL SPINE FINDINGS Alignment: Normal. Skull base and vertebrae: No acute fracture. No primary bone lesion or focal pathologic process. Soft tissues and spinal canal: No prevertebral fluid or swelling. No visible canal hematoma. Disc levels: Moderate degenerative disc disease is noted at C5-6 and C6-7. Upper chest: Negative. Other: Degenerative changes are seen involving the left-sided posterior facet joints. IMPRESSION: No acute intracranial abnormality seen. Moderate multilevel degenerative disc disease. No acute abnormality is noted. Electronically Signed   By: Lupita Raider M.D.   On: 06/19/2021 15:25   CT Cervical Spine Wo Contrast  Result Date: 06/19/2021 CLINICAL DATA:  Unwitnessed fall, facial laceration. EXAM: CT HEAD WITHOUT CONTRAST CT CERVICAL SPINE WITHOUT CONTRAST TECHNIQUE: Multidetector CT imaging of the head and cervical spine was performed following the standard protocol without intravenous contrast. Multiplanar CT image reconstructions of the cervical spine were also generated. COMPARISON:  May 26, 2021. FINDINGS: CT HEAD FINDINGS Brain: No evidence of acute infarction, hemorrhage, hydrocephalus, extra-axial collection or mass lesion/mass effect. Vascular:  No hyperdense vessel or unexpected calcification. Skull: Normal. Negative for fracture or focal lesion. Sinuses/Orbits: No acute finding. Other: None. CT CERVICAL SPINE FINDINGS Alignment: Normal. Skull base and vertebrae: No acute fracture. No primary bone lesion or focal pathologic process. Soft tissues and spinal canal: No prevertebral fluid or swelling. No visible canal hematoma. Disc levels: Moderate degenerative disc disease is noted at C5-6 and C6-7. Upper chest: Negative. Other: Degenerative changes are seen involving the left-sided posterior facet joints. IMPRESSION: No acute intracranial abnormality seen. Moderate multilevel degenerative disc disease. No acute abnormality is noted. Electronically Signed   By: Lupita Raider M.D.   On: 06/19/2021 15:25    Procedures Procedures   Medications Ordered in ED Medications - No data to display  ED Course  I have reviewed the triage vital signs and the nursing notes.  Pertinent labs & imaging results that were available during my care of the patient were reviewed by me and considered in my medical decision making (see chart for details).  MDM Rules/Calculators/A&P                           70 year old female with dementia presents form facility with fall and head injury. No reported LOC, syncope or seizure. EKG baseline. Reported to baseline. CT scan of head and neck is negative. She has a small eyebrow abrasion that requires no treatment. Plan for DC back to facility.   Final Clinical Impression(s) / ED Diagnoses Final diagnoses:  Fall, initial encounter  Injury of head, initial encounter    Rx / DC Orders ED Discharge Orders     None        Rozelle Logan, DO 06/19/21 1603

## 2021-06-19 NOTE — ED Notes (Signed)
ED Provider at bedside. 

## 2021-06-19 NOTE — ED Notes (Signed)
Pt continues to get out of bed. Pt redirected given teddy bear. Door is left open in front of Nurses stattion.

## 2021-06-19 NOTE — ED Triage Notes (Signed)
Patient reports to the ER from [facility]. Patient had an unwitnessed fall. Patient has a laceration to the left eyebrow. Patient denies pain and bloodthinners.

## 2021-06-19 NOTE — ED Notes (Signed)
Attempted to call [facility] to give report.  Will give report when son arrives to take pt home.

## 2021-06-19 NOTE — Discharge Instructions (Addendum)
Patient was seen in the ER. CT scan of the head and neck were negative. Small abrasion to left eyebrow can be treated with Band-Aid. If patient has any worsening symptoms, please return to nearby ER for re evaluation.

## 2021-11-29 ENCOUNTER — Emergency Department (HOSPITAL_COMMUNITY): Payer: Medicare HMO

## 2021-11-29 ENCOUNTER — Other Ambulatory Visit: Payer: Self-pay

## 2021-11-29 ENCOUNTER — Emergency Department (HOSPITAL_COMMUNITY)
Admission: EM | Admit: 2021-11-29 | Discharge: 2021-11-29 | Disposition: A | Discharge status: Home / Self Care | Payer: Medicare HMO | Attending: Emergency Medicine | Admitting: Emergency Medicine

## 2021-11-29 DIAGNOSIS — R059 Cough, unspecified: Secondary | ICD-10-CM | POA: Diagnosis present

## 2021-11-29 DIAGNOSIS — R4182 Altered mental status, unspecified: Secondary | ICD-10-CM | POA: Diagnosis not present

## 2021-11-29 DIAGNOSIS — I10 Essential (primary) hypertension: Secondary | ICD-10-CM | POA: Diagnosis not present

## 2021-11-29 DIAGNOSIS — F0390 Unspecified dementia without behavioral disturbance: Secondary | ICD-10-CM | POA: Insufficient documentation

## 2021-11-29 DIAGNOSIS — J181 Lobar pneumonia, unspecified organism: Secondary | ICD-10-CM | POA: Insufficient documentation

## 2021-11-29 DIAGNOSIS — J189 Pneumonia, unspecified organism: Secondary | ICD-10-CM

## 2021-11-29 LAB — COMPREHENSIVE METABOLIC PANEL
ALT: 68 U/L — ABNORMAL HIGH (ref 0–44)
AST: 71 U/L — ABNORMAL HIGH (ref 15–41)
Albumin: 2.8 g/dL — ABNORMAL LOW (ref 3.5–5.0)
Alkaline Phosphatase: 125 U/L (ref 38–126)
Anion gap: 7 (ref 5–15)
BUN: 11 mg/dL (ref 8–23)
CO2: 24 mmol/L (ref 22–32)
Calcium: 7.9 mg/dL — ABNORMAL LOW (ref 8.9–10.3)
Chloride: 107 mmol/L (ref 98–111)
Creatinine, Ser: 0.82 mg/dL (ref 0.44–1.00)
GFR, Estimated: >60 mL/min (ref >60)
Glucose, Bld: 110 mg/dL — ABNORMAL HIGH (ref 70–99)
Potassium: 3.6 mmol/L (ref 3.5–5.1)
Sodium: 138 mmol/L (ref 135–145)
Total Bilirubin: 0.7 mg/dL (ref 0.3–1.2)
Total Protein: 7.4 g/dL (ref 6.5–8.1)

## 2021-11-29 LAB — CBC WITH DIFFERENTIAL/PLATELET
Abs Immature Granulocytes: 0.05 10*3/uL (ref 0.00–0.07)
Basophils Absolute: 0 10*3/uL (ref 0.0–0.1)
Basophils Relative: 0 %
Eosinophils Absolute: 0.1 10*3/uL (ref 0.0–0.5)
Eosinophils Relative: 1 %
HCT: 31.6 % — ABNORMAL LOW (ref 36.0–46.0)
Hemoglobin: 10.1 g/dL — ABNORMAL LOW (ref 12.0–15.0)
Immature Granulocytes: 1 %
Lymphocytes Relative: 25 %
Lymphs Abs: 2.4 10*3/uL (ref 0.7–4.0)
MCH: 28.2 pg (ref 26.0–34.0)
MCHC: 32 g/dL (ref 30.0–36.0)
MCV: 88.3 fL (ref 80.0–100.0)
Monocytes Absolute: 0.7 10*3/uL (ref 0.1–1.0)
Monocytes Relative: 7 %
Neutro Abs: 6.7 10*3/uL (ref 1.7–7.7)
Neutrophils Relative %: 66 %
Platelets: 602 10*3/uL — ABNORMAL HIGH (ref 150–400)
RBC: 3.58 MIL/uL — ABNORMAL LOW (ref 3.87–5.11)
RDW: 13.8 % (ref 11.5–15.5)
WBC: 9.9 10*3/uL (ref 4.0–10.5)
nRBC: 0 % (ref 0.0–0.2)

## 2021-11-29 LAB — URINALYSIS, ROUTINE W REFLEX MICROSCOPIC
Bacteria, UA: NONE SEEN
Bilirubin Urine: NEGATIVE
Glucose, UA: NEGATIVE mg/dL
Ketones, ur: 5 mg/dL — AB
Leukocytes,Ua: NEGATIVE
Nitrite: NEGATIVE
Protein, ur: NEGATIVE mg/dL
Specific Gravity, Urine: 1.02 (ref 1.005–1.030)
pH: 6 (ref 5.0–8.0)

## 2021-11-29 MED ORDER — LEVOFLOXACIN 750 MG PO TABS: 750.0000 mg | ORAL_TABLET | Freq: Every day | ORAL | 0 refills | Status: DC

## 2021-11-29 MED ORDER — LORAZEPAM 2 MG/ML IJ SOLN
0.5000 mg | Freq: Once | INTRAMUSCULAR | Status: AC
  Administered 2021-11-29: 17:00:00 0.5 mg via INTRAVENOUS
  Filled 2021-11-29: qty 1

## 2021-11-29 NOTE — Discharge Instructions (Signed)
Nancy [name] was diagnosed with pneumonia in the ED this evening. I have prescribed her an antibiotic for this which should cover her pneumonia better than azithromycin. Please stop azithro and replace with Levaquin that I have prescribed.  Otherwise Nancy [name]'s work-up today was unremarkable for acute findings. He CT head was normal, her urine was not infected, and her other labs were also reassuring. Her vitals and presentation were also reassuring that her pneumonia can be managed at her facility.   Also, it sounds like there is some concern about her changes in status could be related to her recent addition of trazadone to her medication regimen. I would recommend discussing this with her primary doctor to try and get a better regimen for her.   Return if development of any new or worsening symptoms

## 2021-11-29 NOTE — ED Triage Notes (Incomplete)
Bib EMS from [facility]. Has been on zpack for cough 3 weeks. No improvement cough is getting worse. Facility doctor requested she come to ED for chest xray to rule out pneumonia.   IV left forearm 20g ns

## 2021-11-29 NOTE — ED Triage Notes (Addendum)
Pt BIB EMS from [facility] c/o weakness, cough for 3 weeks. Per report pt was prescribe antibiotic P.O. for possible pneumonia. Per report Facility is worried pt is not getting better. Pt a/ox1. Pt hx of Dementia.

## 2021-11-29 NOTE — ED Notes (Signed)
Dc instructions and scripts reviewed with facility. No questions or concerns at this time. Pt transported by [facility] back to [facility]

## 2021-11-29 NOTE — ED Notes (Signed)
Report called to [facility] at this time. They do not arrange transportation. [facility] contacted and will be transporting patient back to facility.

## 2021-11-29 NOTE — ED Provider Notes (Signed)
[facility]-EMERGENCY DEPT Provider Note   CSN: [id number] Arrival date & time: 11/29/21  1448     History No chief complaint on file.   Nancy [name] is a 70 y.o. female.  Patient with history of dementia, hyperlipidemia, and hypertension presents today from memory care at [facility] for pneumonia. According to EMS, they were told that patient needed transport to the hospital for unresolved pneumonia following treatment with oral azithromycin. EMS states that facility felt as though her cough was getting worse and they requested that she have repeat x-ray with concern for worsening pneumonia.  I personally called [facility] and discussed with staff who is familiar with Johann, and they state that patient is here for worsening mental status. They state that the patient is now taking her clothes off and not following commands, however they do confirm patient is alert and oriented x 1 at baseline which she is here today. They state that she is normally able to talk in complete sentences and hasnt been recently. They do state that she was recently placed on trazodone for help with sleep and that was about the time that these changes began to occur.Upon my evaluation, patient is speaking in complete sentences although she is notably unaware of time place or event, per her baseline. Facility denies that she has been coughing more lately or any shortness of breath.  Upon discussion with patient, she denies any complaints including pain or shortness of breath.  Also discussed patient with son Molli Hazard that corroborates story that patient has been experiencing generalized deconditioning over the past few weeks mostly related to decreased oral intake. He was also suspicious that it could be due to the addition of trazodone vs generalized deconditioning due to advanced dementia. He has noted that her cough has been persistent without any signs of respiratory distress or  shortness of breath.  Level 5 caveat -- dementia  The history is provided by the patient, the EMS personnel and the nursing home. No language interpreter was used.      Past Medical History:  Diagnosis Date   Dementia (HCC)    Hyperlipidemia    Hypertension     There are no problems to display for this patient.   No past surgical history on file.   OB History   No obstetric history on file.     No family history on file.  Social History   Substance Use Topics   Alcohol use: Never   Drug use: Never    Home Medications Prior to Admission medications   Medication Sig Start Date End Date Taking? Authorizing Provider  alum & mag hydroxide-simeth (MAALOX/MYLANTA) 200-200-20 MG/5ML suspension Take 30 mLs by mouth every 6 (six) hours as needed for indigestion or heartburn.    [provider]  citalopram (CELEXA) 10 MG tablet Take 10 mg by mouth daily. 07/20/20   [provider]  divalproex (DEPAKOTE) 125 MG DR tablet Take 125 mg by mouth daily.    [provider]  guaifenesin (ROBITUSSIN) 100 MG/5ML syrup Take 200 mg by mouth 4 (four) times daily as needed for cough.    [provider]  ibuprofen (ADVIL) 400 MG tablet Take 400 mg by mouth every 6 (six) hours as needed for fever, headache, mild pain, moderate pain or cramping.  02/03/20   [provider]  loperamide (IMODIUM) 2 MG capsule Take 2 mg by mouth as needed for diarrhea or loose stools. 1 cap as needed with loose stool/diarrhea,max  8 doses in 24 hrs    [provider]  OLANZapine (ZYPREXA) 10 MG tablet Take 10 mg by mouth daily. 08/17/20   [provider]  PRESCRIPTION MEDICATION Take 1 each by mouth in the morning, at noon, and at bedtime. Mighty Shakes    [provider]    Allergies    Cefdinir  Review of Systems   Review of Systems  Unable to perform ROS: Dementia  Constitutional:  Negative for chills and fever.  Respiratory:  Negative for  shortness of breath.   Gastrointestinal:  Negative for abdominal pain.  All other systems reviewed and are negative.  Physical Exam Updated Vital Signs BP 120/71    Pulse 87    Resp 17    LMP  (LMP Unknown)    SpO2 97%   Physical Exam Vitals and nursing note reviewed.  Constitutional:      Comments: Patient chronically ill appearing sitting upright in bed in no acute distress  HENT:     Head: Normocephalic and atraumatic.     Nose: Nose normal.     Mouth/Throat:     Mouth: Mucous membranes are moist.  Eyes:     Extraocular Movements: Extraocular movements intact.     Pupils: Pupils are equal, round, and reactive to light.  Cardiovascular:     Rate and Rhythm: Normal rate and regular rhythm.     Heart sounds: Normal heart sounds.  Pulmonary:     Effort: Pulmonary effort is normal. No respiratory distress.     Breath sounds: Normal breath sounds. No stridor. No wheezing, rhonchi or rales.  Abdominal:     General: Abdomen is flat. Bowel sounds are normal. There is no distension.     Palpations: Abdomen is soft. There is no mass.     Tenderness: There is no abdominal tenderness. There is no right CVA tenderness, left CVA tenderness, guarding or rebound.     Hernia: No hernia is present.  Musculoskeletal:        General: Normal range of motion.     Cervical back: Normal range of motion.     Right lower leg: No edema.     Left lower leg: No edema.  Skin:    General: Skin is warm and dry.  Neurological:     Mental Status: Mental status is at baseline.     Comments: Alert and oriented x 1 per baseline moving all extremities without difficulty  Psychiatric:        Mood and Affect: Mood normal.        Behavior: Behavior normal.    ED Results / Procedures / Treatments   Labs (all labs ordered are listed, but only abnormal results are displayed) Labs Reviewed  CBC WITH DIFFERENTIAL/PLATELET - Abnormal; Notable for the following components:      Result Value   RBC 3.58 (*)     Hemoglobin 10.1 (*)    HCT 31.6 (*)    Platelets 602 (*)    All other components within normal limits  COMPREHENSIVE METABOLIC PANEL - Abnormal; Notable for the following components:   Glucose, Bld 110 (*)    Calcium 7.9 (*)    Albumin 2.8 (*)    AST 71 (*)    ALT 68 (*)    All other components within normal limits  URINALYSIS, ROUTINE W REFLEX MICROSCOPIC    EKG None  Radiology DG Chest 2 View  Result Date: 11/29/2021 CLINICAL DATA:  Shortness of breath, weakness, and cough  for 3 weeks. EXAM: CHEST - 2 VIEW COMPARISON:  08/30/2020 FINDINGS: Shallow inspiration. Heart size and pulmonary vascularity are normal. New airspace infiltration in the left lung base with small left pleural effusion, likely pneumonia. Linear atelectasis in the right mid lung. No pneumothorax. Mediastinal contours appear intact. Calcification of the aorta. Fracture of the right humeral neck as seen on previous study. IMPRESSION: Developing airspace infiltration and small effusion in the left base consistent with pneumonia. Electronically Signed   By: Burman Nieves M.D.   On: 11/29/2021 16:20   CT Head Wo Contrast  Result Date: 11/29/2021 CLINICAL DATA:  Mental status change, unknown cause EXAM: CT HEAD WITHOUT CONTRAST TECHNIQUE: Contiguous axial images were obtained from the base of the skull through the vertex without intravenous contrast. COMPARISON:  06/19/2021 head CT. FINDINGS: Brain: No evidence of parenchymal hemorrhage or extra-axial fluid collection. No mass lesion, mass effect, or midline shift. No CT evidence of acute infarction. Generalized cerebral volume loss. Nonspecific mild subcortical and periventricular white matter hypodensity, most in keeping with chronic small vessel ischemic change. No ventriculomegaly. Vascular: No acute abnormality. Skull: No evidence of calvarial fracture. Sinuses/Orbits: The visualized paranasal sinuses are essentially clear. Other:  The mastoid air cells are  unopacified. IMPRESSION: 1. No evidence of acute intracranial abnormality. 2. Generalized cerebral volume loss and mild chronic small vessel ischemic changes in the cerebral white matter. Electronically Signed   By: Delbert Phenix M.D.   On: 11/29/2021 18:01    Procedures Procedures   Medications Ordered in ED Medications  LORazepam (ATIVAN) injection 0.5 mg (0.5 mg Intravenous Given 11/29/21 1646)    ED Course  I have reviewed the triage vital signs and the nursing notes.  Pertinent labs & imaging results that were available during my care of the patient were reviewed by me and considered in my medical decision making (see chart for details).    MDM Rules/Calculators/A&P                         Patient presents today with advanced dementia from memory care facility. Concerns for persistent cough and generalized deconditioning over the past few weeks. She has had some decreased oral intake recently and has been on oral azithromycin for several weeks.  Labs without leukocytosis, anemia consistent with baseline. CMP without electrolyte abnormalities, some bump in liver enzymes, however likely incidental finding as patient is nontender throughout her abdomen without nausea, vomiting, or diarrhea. UA without signs of infection.  CT head without acute abnormalities. CXR reveals left lower lobe pneumonia. She is afebrile, non-toxic appearing, and in no acute distress satting 96% on room air without tachycardia or tachypnea. She is at her baseline mental status corroborated by her son and memory care facility. Therefore, according to CURB-65 patient meets criteria for outpatient pneumonia treatment. Discussed this with son who is amenable with plan. Also discussed the need to talk to her primary doctor about trazodone use as this could be contributing to her generalized deconditioning vs progressive dementia. No other emergent concerns at this time. Will advise close return precautions. Patient  discharged in stable condition.   This is a shared visit with supervising physician Dr. Deretha Emory who has independently evaluated patient & provided guidance in evaluation/management/disposition, in agreement with care     Final Clinical Impression(s) / ED Diagnoses Final diagnoses:  Community acquired pneumonia of left lower lobe of lung    Rx / DC Orders ED Discharge Orders  Ordered    levofloxacin (LEVAQUIN) 750 MG tablet  Daily        11/29/21 2016          An After Visit Summary was printed and given to the patient.    Vear Clock 11/29/21 2035    Vanetta Mulders, MD 12/13/21 2330

## 2021-11-29 NOTE — ED Notes (Signed)
Patient transported to X-ray 

## 2021-11-29 NOTE — ED Notes (Signed)
Placed pt on bed alarm placed pt on purewick and changed pt into gown

## 2021-11-29 NOTE — ED Notes (Signed)
Patient transported to CT 

## 2021-12-04 ENCOUNTER — Emergency Department (HOSPITAL_COMMUNITY): Payer: Medicare HMO

## 2021-12-04 ENCOUNTER — Encounter (HOSPITAL_COMMUNITY): Payer: Self-pay | Admitting: Internal Medicine

## 2021-12-04 ENCOUNTER — Inpatient Hospital Stay (HOSPITAL_COMMUNITY)
Admission: EM | Admit: 2021-12-04 | Discharge: 2021-12-10 | DRG: 871 | Disposition: A | Discharge status: Skilled Nursing Facility | Payer: Medicare HMO | Source: Skilled Nursing Facility | Attending: Family Medicine | Admitting: Family Medicine

## 2021-12-04 ENCOUNTER — Other Ambulatory Visit: Payer: Self-pay

## 2021-12-04 DIAGNOSIS — E869 Volume depletion, unspecified: Secondary | ICD-10-CM | POA: Diagnosis present

## 2021-12-04 DIAGNOSIS — D75839 Thrombocytosis, unspecified: Secondary | ICD-10-CM | POA: Diagnosis present

## 2021-12-04 DIAGNOSIS — Z79899 Other long term (current) drug therapy: Secondary | ICD-10-CM | POA: Diagnosis not present

## 2021-12-04 DIAGNOSIS — W19XXXA Unspecified fall, initial encounter: Secondary | ICD-10-CM | POA: Diagnosis present

## 2021-12-04 DIAGNOSIS — I700 Atherosclerosis of aorta: Secondary | ICD-10-CM | POA: Diagnosis present

## 2021-12-04 DIAGNOSIS — S22000A Wedge compression fracture of unspecified thoracic vertebra, initial encounter for closed fracture: Secondary | ICD-10-CM | POA: Diagnosis present

## 2021-12-04 DIAGNOSIS — R32 Unspecified urinary incontinence: Secondary | ICD-10-CM | POA: Diagnosis not present

## 2021-12-04 DIAGNOSIS — Z20822 Contact with and (suspected) exposure to covid-19: Secondary | ICD-10-CM | POA: Diagnosis present

## 2021-12-04 DIAGNOSIS — R823 Hemoglobinuria: Secondary | ICD-10-CM | POA: Diagnosis present

## 2021-12-04 DIAGNOSIS — J181 Lobar pneumonia, unspecified organism: Secondary | ICD-10-CM | POA: Diagnosis present

## 2021-12-04 DIAGNOSIS — E441 Mild protein-calorie malnutrition: Secondary | ICD-10-CM | POA: Insufficient documentation

## 2021-12-04 DIAGNOSIS — I10 Essential (primary) hypertension: Secondary | ICD-10-CM | POA: Diagnosis present

## 2021-12-04 DIAGNOSIS — F03C4 Unspecified dementia, severe, with anxiety: Secondary | ICD-10-CM | POA: Diagnosis not present

## 2021-12-04 DIAGNOSIS — Z681 Body mass index (BMI) 19 or less, adult: Secondary | ICD-10-CM | POA: Diagnosis not present

## 2021-12-04 DIAGNOSIS — S22050A Wedge compression fracture of T5-T6 vertebra, initial encounter for closed fracture: Secondary | ICD-10-CM | POA: Diagnosis present

## 2021-12-04 DIAGNOSIS — I2489 Other forms of acute ischemic heart disease: Secondary | ICD-10-CM | POA: Insufficient documentation

## 2021-12-04 DIAGNOSIS — E785 Hyperlipidemia, unspecified: Secondary | ICD-10-CM | POA: Diagnosis present

## 2021-12-04 DIAGNOSIS — G9341 Metabolic encephalopathy: Secondary | ICD-10-CM | POA: Diagnosis present

## 2021-12-04 DIAGNOSIS — F03C0 Unspecified dementia, severe, without behavioral disturbance, psychotic disturbance, mood disturbance, and anxiety: Secondary | ICD-10-CM | POA: Diagnosis not present

## 2021-12-04 DIAGNOSIS — Z781 Physical restraint status: Secondary | ICD-10-CM | POA: Diagnosis not present

## 2021-12-04 DIAGNOSIS — E440 Moderate protein-calorie malnutrition: Secondary | ICD-10-CM | POA: Diagnosis present

## 2021-12-04 DIAGNOSIS — L89151 Pressure ulcer of sacral region, stage 1: Secondary | ICD-10-CM | POA: Diagnosis present

## 2021-12-04 DIAGNOSIS — R1310 Dysphagia, unspecified: Secondary | ICD-10-CM | POA: Diagnosis present

## 2021-12-04 DIAGNOSIS — J189 Pneumonia, unspecified organism: Secondary | ICD-10-CM

## 2021-12-04 DIAGNOSIS — A419 Sepsis, unspecified organism: Principal | ICD-10-CM | POA: Diagnosis present

## 2021-12-04 DIAGNOSIS — F03918 Unspecified dementia, unspecified severity, with other behavioral disturbance: Secondary | ICD-10-CM | POA: Diagnosis present

## 2021-12-04 DIAGNOSIS — F0390 Unspecified dementia without behavioral disturbance: Secondary | ICD-10-CM | POA: Diagnosis present

## 2021-12-04 DIAGNOSIS — R778 Other specified abnormalities of plasma proteins: Secondary | ICD-10-CM

## 2021-12-04 DIAGNOSIS — R079 Chest pain, unspecified: Secondary | ICD-10-CM | POA: Diagnosis not present

## 2021-12-04 DIAGNOSIS — R4182 Altered mental status, unspecified: Secondary | ICD-10-CM

## 2021-12-04 DIAGNOSIS — I248 Other forms of acute ischemic heart disease: Secondary | ICD-10-CM | POA: Diagnosis present

## 2021-12-04 DIAGNOSIS — L8990 Pressure ulcer of unspecified site, unspecified stage: Secondary | ICD-10-CM | POA: Insufficient documentation

## 2021-12-04 DIAGNOSIS — Z888 Allergy status to other drugs, medicaments and biological substances status: Secondary | ICD-10-CM | POA: Diagnosis not present

## 2021-12-04 DIAGNOSIS — D649 Anemia, unspecified: Secondary | ICD-10-CM | POA: Diagnosis present

## 2021-12-04 LAB — CBC WITH DIFFERENTIAL/PLATELET
Abs Immature Granulocytes: 0.33 10*3/uL — ABNORMAL HIGH (ref 0.00–0.07)
Basophils Absolute: 0.1 10*3/uL (ref 0.0–0.1)
Basophils Relative: 0 %
Eosinophils Absolute: 0 10*3/uL (ref 0.0–0.5)
Eosinophils Relative: 0 %
HCT: 36.1 % (ref 36.0–46.0)
Hemoglobin: 11.4 g/dL — ABNORMAL LOW (ref 12.0–15.0)
Immature Granulocytes: 2 %
Lymphocytes Relative: 6 %
Lymphs Abs: 1 10*3/uL (ref 0.7–4.0)
MCH: 28.4 pg (ref 26.0–34.0)
MCHC: 31.6 g/dL (ref 30.0–36.0)
MCV: 89.8 fL (ref 80.0–100.0)
Monocytes Absolute: 0.7 10*3/uL (ref 0.1–1.0)
Monocytes Relative: 4 %
Neutro Abs: 15.1 10*3/uL — ABNORMAL HIGH (ref 1.7–7.7)
Neutrophils Relative %: 88 %
Platelets: 542 10*3/uL — ABNORMAL HIGH (ref 150–400)
RBC: 4.02 MIL/uL (ref 3.87–5.11)
RDW: 14.2 % (ref 11.5–15.5)
WBC: 17.3 10*3/uL — ABNORMAL HIGH (ref 4.0–10.5)
nRBC: 0 % (ref 0.0–0.2)

## 2021-12-04 LAB — RESP PANEL BY RT-PCR (FLU A&B, COVID) ARPGX2
Influenza A by PCR: NEGATIVE
Influenza B by PCR: NEGATIVE
SARS Coronavirus 2 by RT PCR: NEGATIVE

## 2021-12-04 LAB — URINALYSIS, ROUTINE W REFLEX MICROSCOPIC
Bacteria, UA: NONE SEEN
Bilirubin Urine: NEGATIVE
Glucose, UA: NEGATIVE mg/dL
Ketones, ur: NEGATIVE mg/dL
Nitrite: NEGATIVE
Protein, ur: NEGATIVE mg/dL
Specific Gravity, Urine: 1.013 (ref 1.005–1.030)
pH: 8 (ref 5.0–8.0)

## 2021-12-04 LAB — BLOOD GAS, VENOUS
Acid-Base Excess: 1.4 mmol/L (ref 0.0–2.0)
Bicarbonate: 24.7 mmol/L (ref 20.0–28.0)
FIO2: 21
O2 Saturation: 86.7 %
Patient temperature: 98.6
pCO2, Ven: 36.3 mmHg — ABNORMAL LOW (ref 44.0–60.0)
pH, Ven: 7.448 — ABNORMAL HIGH (ref 7.250–7.430)
pO2, Ven: 51.8 mmHg — ABNORMAL HIGH (ref 32.0–45.0)

## 2021-12-04 LAB — PROTIME-INR
INR: 1.2 (ref 0.8–1.2)
Prothrombin Time: 15 seconds (ref 11.4–15.2)

## 2021-12-04 LAB — COMPREHENSIVE METABOLIC PANEL
ALT: 38 U/L (ref 0–44)
AST: 38 U/L (ref 15–41)
Albumin: 3.1 g/dL — ABNORMAL LOW (ref 3.5–5.0)
Alkaline Phosphatase: 106 U/L (ref 38–126)
Anion gap: 6 (ref 5–15)
BUN: 9 mg/dL (ref 8–23)
CO2: 24 mmol/L (ref 22–32)
Calcium: 8.2 mg/dL — ABNORMAL LOW (ref 8.9–10.3)
Chloride: 110 mmol/L (ref 98–111)
Creatinine, Ser: 0.96 mg/dL (ref 0.44–1.00)
GFR, Estimated: >60 mL/min (ref >60)
Glucose, Bld: 124 mg/dL — ABNORMAL HIGH (ref 70–99)
Potassium: 3.6 mmol/L (ref 3.5–5.1)
Sodium: 140 mmol/L (ref 135–145)
Total Bilirubin: 0.6 mg/dL (ref 0.3–1.2)
Total Protein: 7.2 g/dL (ref 6.5–8.1)

## 2021-12-04 LAB — MRSA NEXT GEN BY PCR, NASAL: MRSA by PCR Next Gen: NOT DETECTED

## 2021-12-04 LAB — LACTIC ACID, PLASMA
Lactic Acid, Venous: 1.7 mmol/L (ref 0.5–1.9)
Lactic Acid, Venous: 1.1 mmol/L (ref 0.5–1.9)

## 2021-12-04 LAB — TROPONIN I (HIGH SENSITIVITY)
Troponin I (High Sensitivity): 293 ng/L (ref <18)
Troponin I (High Sensitivity): 316 ng/L (ref <18)
Troponin I (High Sensitivity): 300 ng/L (ref <18)

## 2021-12-04 LAB — BRAIN NATRIURETIC PEPTIDE: B Natriuretic Peptide: 210.9 pg/mL — ABNORMAL HIGH (ref 0.0–100.0)

## 2021-12-04 LAB — CBG MONITORING, ED: Glucose-Capillary: 109 mg/dL — ABNORMAL HIGH (ref 70–99)

## 2021-12-04 LAB — APTT: aPTT: 23 seconds — ABNORMAL LOW (ref 24–36)

## 2021-12-04 LAB — TSH: TSH: 1.854 u[IU]/mL (ref 0.350–4.500)

## 2021-12-04 LAB — ETHANOL: Alcohol, Ethyl (B): <10 mg/dL (ref <10)

## 2021-12-04 LAB — AMMONIA: Ammonia: 22 umol/L (ref 9–35)

## 2021-12-04 MED ORDER — OLANZAPINE 5 MG PO TABS
10.0000 mg | ORAL_TABLET | Freq: Every day | ORAL | Status: DC
  Administered 2021-12-05 – 2021-12-10 (×6): 10 mg via ORAL
  Filled 2021-12-04 (×3): qty 2
  Filled 2021-12-04 (×2): qty 1
  Filled 2021-12-04: qty 2

## 2021-12-04 MED ORDER — SODIUM CHLORIDE (PF) 0.9 % IJ SOLN
INTRAMUSCULAR | Status: AC
  Filled 2021-12-04: qty 50

## 2021-12-04 MED ORDER — ONDANSETRON HCL 4 MG PO TABS: 4.0000 mg | ORAL_TABLET | Freq: Four times a day (QID) | ORAL | Status: DC | PRN

## 2021-12-04 MED ORDER — LACTATED RINGERS IV BOLUS (SEPSIS)
1000.0000 mL | Freq: Once | INTRAVENOUS | Status: AC
  Administered 2021-12-04: 1000 mL via INTRAVENOUS

## 2021-12-04 MED ORDER — LACTATED RINGERS IV BOLUS
1000.0000 mL | Freq: Once | INTRAVENOUS | Status: AC
  Administered 2021-12-04: 1000 mL via INTRAVENOUS

## 2021-12-04 MED ORDER — METOPROLOL TARTRATE 5 MG/5ML IV SOLN
5.0000 mg | Freq: Once | INTRAVENOUS | Status: AC
  Administered 2021-12-04: 5 mg via INTRAVENOUS
  Filled 2021-12-04 (×2): qty 5

## 2021-12-04 MED ORDER — VANCOMYCIN HCL IN DEXTROSE 1-5 GM/200ML-% IV SOLN
1000.0000 mg | Freq: Once | INTRAVENOUS | Status: AC
  Administered 2021-12-04: 1000 mg via INTRAVENOUS
  Filled 2021-12-04: qty 200

## 2021-12-04 MED ORDER — METOPROLOL TARTRATE 5 MG/5ML IV SOLN
5.0000 mg | Freq: Three times a day (TID) | INTRAVENOUS | Status: DC
  Administered 2021-12-04 – 2021-12-10 (×17): 5 mg via INTRAVENOUS
  Filled 2021-12-04 (×17): qty 5

## 2021-12-04 MED ORDER — ALUM & MAG HYDROXIDE-SIMETH 200-200-20 MG/5ML PO SUSP: 30.0000 mL | Freq: Four times a day (QID) | ORAL | Status: DC | PRN

## 2021-12-04 MED ORDER — VANCOMYCIN HCL 750 MG/150ML IV SOLN: 750.0000 mg | INTRAVENOUS | Status: DC

## 2021-12-04 MED ORDER — SODIUM CHLORIDE 0.9 % IV SOLN
2.0000 g | Freq: Once | INTRAVENOUS | Status: AC
  Administered 2021-12-04: 2 g via INTRAVENOUS
  Filled 2021-12-04: qty 2

## 2021-12-04 MED ORDER — BACITRACIN-NEOMYCIN-POLYMYXIN 400-5-5000 EX OINT: 1.0000 "application " | TOPICAL_OINTMENT | Freq: Every day | CUTANEOUS | Status: DC | PRN

## 2021-12-04 MED ORDER — CITALOPRAM HYDROBROMIDE 20 MG PO TABS
10.0000 mg | ORAL_TABLET | Freq: Every day | ORAL | Status: DC
  Administered 2021-12-05 – 2021-12-09 (×5): 10 mg via ORAL
  Filled 2021-12-04 (×5): qty 1

## 2021-12-04 MED ORDER — DIVALPROEX SODIUM 125 MG PO DR TAB
125.0000 mg | DELAYED_RELEASE_TABLET | Freq: Every day | ORAL | Status: DC
  Administered 2021-12-05 – 2021-12-09 (×5): 125 mg via ORAL
  Filled 2021-12-04 (×7): qty 1

## 2021-12-04 MED ORDER — GUAIFENESIN 100 MG/5ML PO LIQD: 200.0000 mg | Freq: Four times a day (QID) | ORAL | Status: DC | PRN

## 2021-12-04 MED ORDER — VANCOMYCIN HCL IN DEXTROSE 1-5 GM/200ML-% IV SOLN
1000.0000 mg | INTRAVENOUS | Status: DC
  Administered 2021-12-05 – 2021-12-06 (×2): 1000 mg via INTRAVENOUS
  Filled 2021-12-04 (×2): qty 200

## 2021-12-04 MED ORDER — TRAZODONE HCL 50 MG PO TABS
50.0000 mg | ORAL_TABLET | Freq: Every day | ORAL | Status: DC
  Administered 2021-12-07 – 2021-12-08 (×2): 50 mg via ORAL
  Filled 2021-12-04 (×5): qty 1

## 2021-12-04 MED ORDER — FENTANYL CITRATE PF 50 MCG/ML IJ SOSY
50.0000 ug | PREFILLED_SYRINGE | Freq: Once | INTRAMUSCULAR | Status: AC
  Administered 2021-12-04: 50 ug via INTRAVENOUS
  Filled 2021-12-04: qty 1

## 2021-12-04 MED ORDER — ACETAMINOPHEN 325 MG PO TABS: 650.0000 mg | ORAL_TABLET | Freq: Four times a day (QID) | ORAL | Status: DC | PRN

## 2021-12-04 MED ORDER — IOHEXOL 350 MG/ML SOLN
80.0000 mL | Freq: Once | INTRAVENOUS | Status: AC | PRN
  Administered 2021-12-04: 80 mL via INTRAVENOUS

## 2021-12-04 MED ORDER — LACTATED RINGERS IV SOLN: INTRAVENOUS | Status: AC

## 2021-12-04 MED ORDER — ENOXAPARIN SODIUM 40 MG/0.4ML IJ SOSY
40.0000 mg | PREFILLED_SYRINGE | INTRAMUSCULAR | Status: DC
  Administered 2021-12-04 – 2021-12-09 (×6): 40 mg via SUBCUTANEOUS
  Filled 2021-12-04 (×6): qty 0.4

## 2021-12-04 MED ORDER — SODIUM CHLORIDE 0.9 % IV SOLN
2.0000 g | Freq: Two times a day (BID) | INTRAVENOUS | Status: DC
  Administered 2021-12-04 – 2021-12-08 (×8): 2 g via INTRAVENOUS
  Filled 2021-12-04 (×9): qty 2

## 2021-12-04 MED ORDER — ACETAMINOPHEN 650 MG RE SUPP: 650.0000 mg | Freq: Four times a day (QID) | RECTAL | Status: DC | PRN

## 2021-12-04 MED ORDER — ONDANSETRON HCL 4 MG/2ML IJ SOLN: 4.0000 mg | Freq: Four times a day (QID) | INTRAMUSCULAR | Status: DC | PRN

## 2021-12-04 MED ORDER — MAGNESIUM HYDROXIDE 400 MG/5ML PO SUSP: 30.0000 mL | Freq: Every evening | ORAL | Status: DC | PRN

## 2021-12-04 NOTE — ED Notes (Signed)
Per Dr. Robb Matar, second set of blood cultures are not needed at this time. Pt has already received Vancomycin and Cefepime.

## 2021-12-04 NOTE — ED Notes (Signed)
HR 130s-140s sustained. Dr. Robb Matar informed via epic secure chat.

## 2021-12-04 NOTE — ED Provider Notes (Signed)
[facility]-EMERGENCY DEPT Provider Note   CSN: [id number] Arrival date & time: 12/04/21  0754     History  Chief Complaint  Patient presents with   Altered Mental Status   Fall    Nancy [name] is a 71 y.o. female.  HPI  71 year old female with history of dementia, HLD, HTN who has been treated outpatient with Levaquin for community-acquired pneumonia who presents to the emergency department with altered mental status and concern for sepsis.  The patient presents from [facility], brought in by EMS after an unwitnessed fall.  Staff had reportedly stated that the patient's mental status was deteriorating with increasing confusion.  She is only alert and oriented to self at baseline.  She complained of neck and back pain after fall but it is unclear if this was acute or more chronic pain.  She was seen in the emergency department on 12/29, diagnosed with pneumonia and started on Levaquin.  She presents to the emergency department afebrile, tachycardic on cardiac telemetry with sinus tachycardia to the 120s, tachypneic with a respiratory rate of 28.  Code sepsis called on my evaluation following patient arrival.  Home Medications Prior to Admission medications   Medication Sig Start Date End Date Taking? Authorizing Provider  alum & mag hydroxide-simeth (MAALOX/MYLANTA) 200-200-20 MG/5ML suspension Take 30 mLs by mouth every 6 (six) hours as needed for indigestion or heartburn.    [provider]  citalopram (CELEXA) 10 MG tablet Take 10 mg by mouth daily. 07/20/20   [provider]  divalproex (DEPAKOTE) 125 MG DR tablet Take 125 mg by mouth daily.    [provider]  guaifenesin (ROBITUSSIN) 100 MG/5ML syrup Take 200 mg by mouth 4 (four) times daily as needed for cough.    [provider]  ibuprofen (ADVIL) 400 MG tablet Take 400 mg by mouth every 6 (six) hours as needed for fever, headache, mild pain, moderate pain or cramping.   02/03/20   [provider]  levofloxacin (LEVAQUIN) 750 MG tablet Take 1 tablet (750 mg total) by mouth daily for 7 days. 11/29/21 12/06/21  Smoot, Shawn Route, PA-C  loperamide (IMODIUM) 2 MG capsule Take 2 mg by mouth as needed for diarrhea or loose stools. 1 cap as needed with loose stool/diarrhea,max 8 doses in 24 hrs    [provider]  OLANZapine (ZYPREXA) 10 MG tablet Take 10 mg by mouth daily. 08/17/20   [provider]  PRESCRIPTION MEDICATION Take 1 each by mouth in the morning, at noon, and at bedtime. Mighty Shakes    [provider]      Allergies    Cefdinir    Review of Systems   Review of Systems  Unable to perform ROS: Dementia   Physical Exam Updated Vital Signs BP (!) 146/84    Pulse (!) 141    Temp 98 F (36.7 C) (Oral)    Resp (!) 26    LMP  (LMP Unknown)    SpO2 94%  Physical Exam Vitals and nursing note reviewed.  Constitutional:      General: She is not in acute distress.    Appearance: She is well-developed.     Comments: GCS 15, ABC intact  HENT:     Head: Normocephalic and atraumatic.     Comments: Dried blood along the patient's nares    Mouth/Throat:     Mouth: Mucous membranes are dry.  Eyes:     Extraocular Movements: Extraocular movements intact.  Conjunctiva/sclera: Conjunctivae normal.     Pupils: Pupils are equal, round, and reactive to light.  Neck:     Comments: No midline tenderness to palpation of the cervical spine.  C-collar in place Cardiovascular:     Rate and Rhythm: Regular rhythm. Tachycardia present.     Heart sounds: No murmur heard. Pulmonary:     Effort: Pulmonary effort is normal. No respiratory distress.  Chest:     Chest wall: No tenderness.     Comments: Clavicles stable nontender to AP compression.  Chest wall stable and nontender to AP and lateral compression. Abdominal:     Palpations: Abdomen is soft.     Tenderness: There is no abdominal tenderness.  Musculoskeletal:        General:  No deformity or signs of injury.     Cervical back: Neck supple.     Right lower leg: No edema.     Left lower leg: No edema.     Comments: No midline tenderness to palpation of the thoracic or lumbar spine.  Extremities atraumatic with intact range of motion  Skin:    General: Skin is warm and dry.  Neurological:     Mental Status: She is alert.     Comments: Cranial nerves II through XII grossly intact.  Moving all 4 extremities spontaneously.  Sensation grossly intact all 4 extremities    ED Results / Procedures / Treatments   Labs (all labs ordered are listed, but only abnormal results are displayed) Labs Reviewed  COMPREHENSIVE METABOLIC PANEL - Abnormal; Notable for the following components:      Result Value   Glucose, Bld 124 (*)    Calcium 8.2 (*)    Albumin 3.1 (*)    All other components within normal limits  CBC WITH DIFFERENTIAL/PLATELET - Abnormal; Notable for the following components:   WBC 17.3 (*)    Hemoglobin 11.4 (*)    Platelets 542 (*)    Neutro Abs 15.1 (*)    Abs Immature Granulocytes 0.33 (*)    All other components within normal limits  APTT - Abnormal; Notable for the following components:   aPTT 23 (*)    All other components within normal limits  BLOOD GAS, VENOUS - Abnormal; Notable for the following components:   pH, Ven 7.448 (*)    pCO2, Ven 36.3 (*)    pO2, Ven 51.8 (*)    All other components within normal limits  CBG MONITORING, ED - Abnormal; Notable for the following components:   Glucose-Capillary 109 (*)    All other components within normal limits  RESP PANEL BY RT-PCR (FLU A&B, COVID) ARPGX2  CULTURE, BLOOD (ROUTINE X 2)  CULTURE, BLOOD (ROUTINE X 2)  URINE CULTURE  EXPECTORATED SPUTUM ASSESSMENT W GRAM STAIN, RFLX TO RESP C  LACTIC ACID, PLASMA  LACTIC ACID, PLASMA  PROTIME-INR  AMMONIA  ETHANOL  TSH  URINALYSIS, ROUTINE W REFLEX MICROSCOPIC  BRAIN NATRIURETIC PEPTIDE  STREP PNEUMONIAE URINARY ANTIGEN  TROPONIN I (HIGH  SENSITIVITY)    EKG EKG Interpretation  Date/Time:  Tuesday December 04 2021 08:10:01 EST Ventricular Rate:  119 PR Interval:  167 QRS Duration: 84 QT Interval:  321 QTC Calculation: 452 R Axis:   10 Text Interpretation: Sinus tachycardia Anteroseptal infarct, old Borderline repolarization abnormality Confirmed by Ernie Avena ([id number]) on 12/04/2021 8:30:35 AM  Radiology CT HEAD WO CONTRAST ( )  Result Date: 12/04/2021 CLINICAL DATA:  Head trauma, minor EXAM: CT HEAD WITHOUT CONTRAST TECHNIQUE: Contiguous axial images  were obtained from the base of the skull through the vertex without intravenous contrast. COMPARISON:  11/29/2021 FINDINGS: Brain: Today's study suffers from motion degradation. There is brain atrophy as seen previously. No sign of acute infarction, mass lesion, hemorrhage, hydrocephalus or extra-axial collection. Vascular: There is atherosclerotic calcification of the major vessels at the base of the brain. Skull: Negative, allowing for motion degradation. Sinuses/Orbits: Clear/negative Other: 9 IMPRESSION: Motion degraded study.  No acute or traumatic finding. Electronically Signed   By: Paulina Fusi M.D.   On: 12/04/2021 11:54   CT Angio Chest PE W and/or Wo Contrast  Result Date: 12/04/2021 CLINICAL DATA:  Pulmonary embolism suspected, high probability. Abdominal trauma, blunt. EXAM: CT ANGIOGRAPHY CHEST CT ABDOMEN AND PELVIS WITH CONTRAST TECHNIQUE: Multidetector CT imaging of the chest was performed using the standard protocol during bolus administration of intravenous contrast. Multiplanar CT image reconstructions and MIPs were obtained to evaluate the vascular anatomy. Multidetector CT imaging of the abdomen and pelvis was performed using the standard protocol during bolus administration of intravenous contrast. CONTRAST:  80mL OMNIPAQUE IOHEXOL 350 MG/ML SOLN COMPARISON:  Two-view chest x-ray 11/29/2021 and one-view chest x-ray 12/04/2021 FINDINGS: CTA CHEST FINDINGS  Cardiovascular: Heart size is normal. Atherosclerotic calcifications are present at the aortic arch great vessel origins. No stenosis or aneurysm is present. No acute vascular injury is present. Pulmonary arteries demonstrate excellent opacification. No focal filling defects are present to suggest pulmonary emboli. Pulmonary artery size is normal. Mediastinum/Nodes: No enlarged mediastinal, hilar, or axillary lymph nodes. Thyroid gland, trachea, and esophagus demonstrate no significant findings. Lungs/Pleura: Patchy airspace opacities are present in the inferior upper lobes bilaterally. Patchy airspace opacities are present inferiorly in the upper lobes bilaterally and in the right middle lobe. More extensive patchy airspace consolidation is present the right lung base. More dense consolidation is present in the medial and posterior left lower lobe. A small left pleural effusion is present. The airways are patent. Musculoskeletal: The T6 compression fracture is new since 11/29/2021. Approximately 50% loss of height is present without significant retropulsed bone. Remote superior endplate fractures are present at T9 and T11. No focal osseous lesions are present. No chest wall mass scratched at no chest wall lesions are present. Ribs are within normal limits. Review of the MIP images confirms the above findings. CT ABDOMEN and PELVIS FINDINGS Hepatobiliary: Simple cyst in the right lobe of the liver on image 10 of series 2 measures 11 mm. No acute trauma is present. The common bile duct gallbladder normal. Pancreas: Unremarkable. No pancreatic ductal dilatation or surrounding inflammatory changes. Spleen: Splenic cyst measures 15 mm. Spleen is otherwise unremarkable. Adrenals/Urinary Tract: Adrenal glands are unremarkable. Kidneys are normal, without renal calculi, focal lesion, or hydronephrosis. Bladder is unremarkable. Stomach/Bowel: The stomach and duodenum are within normal limits. Small bowel is unremarkable.  There is some stranding in mesentery. Terminal ileum is within normal limits. Appendix is visualized and within normal limits. The ascending and transverse colon are normal. Descending and sigmoid colon are within normal limits. Vascular/Lymphatic: Atherosclerotic calcifications are present in the aorta branch vessels without aneurysm. No significant adenopathy is present. Reproductive: Uterus and bilateral adnexa are unremarkable. Other: No abdominal wall hernia or abnormality. No abdominopelvic ascites. Musculoskeletal: Grade 1 degenerative anterolisthesis is present at L4-5. Chronic endplate changes are worse right than left associated with curvature of the spine at L5-S1. Bony pelvis is within normal limits. The hips are located and normal. Review of the MIP images confirms the above findings. IMPRESSION: 1.  No pulmonary embolus. 2. Extensive bilateral airspace disease compatible with multi lobar pneumonia. The most prominent area of consolidation is in the left lower lobe. Aspiration considered less likely. 3. Small left pleural effusion. 4. Acute/subacute T6 compression fracture with approximately 50% loss of height. This is new since 11/29/2021. 5. Remote superior endplate fractures at T9 and T11. 6. No acute trauma to the abdomen or pelvis. 7. Benign-appearing cysts of the liver and spleen. 8. Aortic Atherosclerosis (ICD10-I70.0). Electronically Signed   By: Marin Robertshristopher  Mattern M.D.   On: 12/04/2021 12:08   CT CERVICAL SPINE WO CONTRAST  Result Date: 12/04/2021 CLINICAL DATA:  Neck trauma EXAM: CT CERVICAL SPINE WITHOUT CONTRAST TECHNIQUE: Multidetector CT imaging of the cervical spine was performed without intravenous contrast. Multiplanar CT image reconstructions were also generated. COMPARISON:  06/19/2021 FINDINGS: Alignment: Minimal curvature convex to the right. Skull base and vertebrae: No evidence of regional fracture or focal bone lesion. Soft tissues and spinal canal: Negative Disc levels:  Ordinary spondylosis C4-5 through C6-7. Mild facet osteoarthritis. Bony foraminal encroachment by osteophytes on the right at C5-6 and C6-7 and on the left at C4-5, C5-6 and C6-7. Upper chest: Negative Other: None IMPRESSION: No acute or traumatic finding. Ordinary mid cervical spondylosis as described. Electronically Signed   By: Paulina FusiMark  Shogry M.D.   On: 12/04/2021 11:55   CT ABDOMEN PELVIS W CONTRAST  Result Date: 12/04/2021 CLINICAL DATA:  Pulmonary embolism suspected, high probability. Abdominal trauma, blunt. EXAM: CT ANGIOGRAPHY CHEST CT ABDOMEN AND PELVIS WITH CONTRAST TECHNIQUE: Multidetector CT imaging of the chest was performed using the standard protocol during bolus administration of intravenous contrast. Multiplanar CT image reconstructions and MIPs were obtained to evaluate the vascular anatomy. Multidetector CT imaging of the abdomen and pelvis was performed using the standard protocol during bolus administration of intravenous contrast. CONTRAST:  80mL OMNIPAQUE IOHEXOL 350 MG/ML SOLN COMPARISON:  Two-view chest x-ray 11/29/2021 and one-view chest x-ray 12/04/2021 FINDINGS: CTA CHEST FINDINGS Cardiovascular: Heart size is normal. Atherosclerotic calcifications are present at the aortic arch great vessel origins. No stenosis or aneurysm is present. No acute vascular injury is present. Pulmonary arteries demonstrate excellent opacification. No focal filling defects are present to suggest pulmonary emboli. Pulmonary artery size is normal. Mediastinum/Nodes: No enlarged mediastinal, hilar, or axillary lymph nodes. Thyroid gland, trachea, and esophagus demonstrate no significant findings. Lungs/Pleura: Patchy airspace opacities are present in the inferior upper lobes bilaterally. Patchy airspace opacities are present inferiorly in the upper lobes bilaterally and in the right middle lobe. More extensive patchy airspace consolidation is present the right lung base. More dense consolidation is present in  the medial and posterior left lower lobe. A small left pleural effusion is present. The airways are patent. Musculoskeletal: The T6 compression fracture is new since 11/29/2021. Approximately 50% loss of height is present without significant retropulsed bone. Remote superior endplate fractures are present at T9 and T11. No focal osseous lesions are present. No chest wall mass scratched at no chest wall lesions are present. Ribs are within normal limits. Review of the MIP images confirms the above findings. CT ABDOMEN and PELVIS FINDINGS Hepatobiliary: Simple cyst in the right lobe of the liver on image 10 of series 2 measures 11 mm. No acute trauma is present. The common bile duct gallbladder normal. Pancreas: Unremarkable. No pancreatic ductal dilatation or surrounding inflammatory changes. Spleen: Splenic cyst measures 15 mm. Spleen is otherwise unremarkable. Adrenals/Urinary Tract: Adrenal glands are unremarkable. Kidneys are normal, without renal calculi, focal lesion, or hydronephrosis.  Bladder is unremarkable. Stomach/Bowel: The stomach and duodenum are within normal limits. Small bowel is unremarkable. There is some stranding in mesentery. Terminal ileum is within normal limits. Appendix is visualized and within normal limits. The ascending and transverse colon are normal. Descending and sigmoid colon are within normal limits. Vascular/Lymphatic: Atherosclerotic calcifications are present in the aorta branch vessels without aneurysm. No significant adenopathy is present. Reproductive: Uterus and bilateral adnexa are unremarkable. Other: No abdominal wall hernia or abnormality. No abdominopelvic ascites. Musculoskeletal: Grade 1 degenerative anterolisthesis is present at L4-5. Chronic endplate changes are worse right than left associated with curvature of the spine at L5-S1. Bony pelvis is within normal limits. The hips are located and normal. Review of the MIP images confirms the above findings. IMPRESSION:  1. No pulmonary embolus. 2. Extensive bilateral airspace disease compatible with multi lobar pneumonia. The most prominent area of consolidation is in the left lower lobe. Aspiration considered less likely. 3. Small left pleural effusion. 4. Acute/subacute T6 compression fracture with approximately 50% loss of height. This is new since 11/29/2021. 5. Remote superior endplate fractures at T9 and T11. 6. No acute trauma to the abdomen or pelvis. 7. Benign-appearing cysts of the liver and spleen. 8. Aortic Atherosclerosis (ICD10-I70.0). Electronically Signed   By: Marin Robertshristopher  Mattern M.D.   On: 12/04/2021 12:08   CT T-SPINE NO CHARGE  Result Date: 12/04/2021 CLINICAL DATA:  Fall, altered mental status EXAM: CT LUMBAR SPINE WITHOUT CONTRAST TECHNIQUE: Multidetector CT imaging of the lumbar spine was performed without intravenous contrast administration. Multiplanar CT image reconstructions were also generated. COMPARISON:  None. FINDINGS: Thoracic spine. Alignment: Advanced kyphosis of the thoracic spine. Vertebrae: Wedge-shaped compression deformity of T6 vertebral body with approximately 50% anterior vertebral body height loss. There is no surrounding edema. No retropulsion into the spinal canal. Paraspinal and other soft tissues: No evidence of hematoma or significant soft tissue abnormality. Left lung opacity concerning for pneumonia. Disc levels: Advanced degenerate disc disease. No significant spinal canal narrowing. Lumbar spine: Segmentation: 5 lumbar type vertebrae. Alignment: Grade 1 anterolisthesis of L4. Vertebrae: No acute fracture or focal pathologic process. Paraspinal and other soft tissues: Negative. Disc levels: Disc osteophyte complex with prominent osteophytes at L5-S1. Moderate spinal canal and right neural foraminal narrowing. Multilevel facet joint arthropathy. IMPRESSION: 1. Wedge-shaped compression deformity of T6 vertebral body with approximately 50% anterior vertebral body height loss  without surrounding edema, likely a chronic process. 2. Advanced kyphosis of the thoracic spine. 3. 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 No acute lumbar fracture or significant soft tissue abnormality. Electronically Signed   By: Larose HiresImran  Ahmed D.O.   On: 12/04/2021 13:03   CT L-SPINE NO CHARGE  Result Date: 12/04/2021 CLINICAL DATA:  Fall, altered mental status EXAM: CT LUMBAR SPINE WITHOUT CONTRAST TECHNIQUE: Multidetector CT imaging of the lumbar spine was performed without intravenous contrast administration. Multiplanar CT image reconstructions were also generated. COMPARISON:  None. FINDINGS: Thoracic spine. Alignment: Advanced kyphosis of the thoracic spine. Vertebrae: Wedge-shaped compression deformity of T6 vertebral body with approximately 50% anterior vertebral body height loss. There is no surrounding edema. No retropulsion into the spinal canal. Paraspinal and other soft tissues: No evidence of hematoma or significant soft tissue abnormality. Left lung opacity concerning for pneumonia. Disc levels: Advanced degenerate disc disease. No significant spinal canal narrowing. Lumbar spine: Segmentation: 5 lumbar type vertebrae. Alignment: Grade 1 anterolisthesis of L4. Vertebrae: No acute fracture or focal pathologic process. Paraspinal and other soft tissues: Negative.  Disc levels: Disc osteophyte complex with prominent osteophytes at L5-S1. Moderate spinal canal and right neural foraminal narrowing. Multilevel facet joint arthropathy. IMPRESSION: 1. Wedge-shaped compression deformity of T6 vertebral body with approximately 50% anterior vertebral body height loss without surrounding edema, likely a chronic process. 2. Advanced kyphosis of the thoracic spine. 3. 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 No acute lumbar fracture or significant soft tissue abnormality. Electronically Signed   By: Larose Hires D.O.   On: 12/04/2021 13:03   DG  Chest Port 1 View  Result Date: 12/04/2021 CLINICAL DATA:  Altered mental status. EXAM: PORTABLE CHEST 1 VIEW COMPARISON:  08/30/2021 FINDINGS: 0912 hours. Lungs are hyperexpanded. Patchy airspace disease again noted left base compatible with pneumonia. No pulmonary edema or substantial pleural effusion. Bones are diffusely demineralized. Telemetry leads overlie the chest. IMPRESSION: Persistent patchy airspace disease left base compatible with pneumonia. Electronically Signed   By: Kennith Center M.D.   On: 12/04/2021 09:22    Procedures .Critical Care Performed by: Ernie Avena, MD Authorized by: Ernie Avena, MD   Critical care provider statement:    Critical care time (minutes):  124   Critical care was necessary to treat or prevent imminent or life-threatening deterioration of the following conditions:  Sepsis   Critical care was time spent personally by me on the following activities:  Development of treatment plan with patient or surrogate, evaluation of patient's response to treatment, examination of patient, obtaining history from patient or surrogate, ordering and performing treatments and interventions, ordering and review of laboratory studies, ordering and review of radiographic studies, re-evaluation of patient's condition and review of old charts   Care discussed with: admitting provider      Medications Ordered in ED Medications  lactated ringers infusion ( Intravenous New Bag/Given 12/04/21 1002)  sodium chloride (PF) 0.9 % injection (has no administration in time range)  lactated ringers bolus 1,000 mL (has no administration in time range)  enoxaparin (LOVENOX) injection 40 mg (has no administration in time range)  acetaminophen (TYLENOL) tablet 650 mg (has no administration in time range)    Or  acetaminophen (TYLENOL) suppository 650 mg (has no administration in time range)  ondansetron (ZOFRAN) tablet 4 mg (has no administration in time range)    Or  ondansetron  (ZOFRAN) injection 4 mg (has no administration in time range)  lactated ringers bolus 1,000 mL (1,000 mLs Intravenous New Bag/Given 12/04/21 0922)  vancomycin (VANCOCIN) IVPB 1000 mg/200 mL premix (0 mg Intravenous Stopped 12/04/21 1307)  ceFEPIme (MAXIPIME) 2 g in sodium chloride 0.9 % 100 mL IVPB (0 g Intravenous Stopped 12/04/21 1307)  iohexol (OMNIPAQUE) 350 MG/ML injection 80 mL (80 mLs Intravenous Contrast Given 12/04/21 1120)  fentaNYL (SUBLIMAZE) injection 50 mcg (50 mcg Intravenous Given 12/04/21 1305)    ED Course/ Medical Decision Making/ A&P                           Medical Decision Making  71 year old female with history of dementia, HLD, HTN who has been treated outpatient with Levaquin for community-acquired pneumonia who presents to the emergency department with altered mental status and concern for sepsis.  The patient presents from [facility], brought in by EMS after an unwitnessed fall.  Staff had reportedly stated that the patient's mental status was deteriorating with increasing confusion.  She is only alert and oriented to self at baseline.  She complained of neck and back pain  after fall but it is unclear if this was acute or more chronic pain.  She was seen in the emergency department on 12/29, diagnosed with pneumonia and started on Levaquin.  She presents to the emergency department afebrile, tachycardic on cardiac telemetry with sinus tachycardia to the 120s, tachypneic with a respiratory rate of 28.  Code sepsis called on my evaluation following patient arrival.  Vitals on arrival significant for afebrile, tachycardia P1 26, intermittent tachypnea, hemodynamically stable, BP 140/70, saturating her percent on room air.  No clear traumatic injury identified on initial physical exam.  Patient appears to be disoriented, alert and oriented x1.  Given her tachycardia and tachypnea in the setting of outpatient treatment for pneumonia with Levaquin, broad-spectrum antibiotic coverage  was started with vancomycin and cefepime.  Patient was status post 700 cc IV fluids with EMS, an additional 1 L was ordered for volume resuscitation in the setting of sinus tachycardia and dry mucous membranes on exam.  I discussed the care of the patient with the patient's son who is also her healthcare power of attorney.  He requested that the patient remain full code for now.  On reassessment, the patient remained tachycardic status post adequate volume resuscitation at 30 cc/kg for her body weight.   Review of the patient's laboratory work-up significant for a VBG without a significant acidosis, mild alkalosis to 7.45, leukocytosis to 17.3, CBG normal, ethanol, ammonia, TSH normal, CMP without significant electrolyte abnormality, blood cultures collected and pending, lactic acid normal, COVID-19 and influenza PCR negative.  Urinalysis and urine culture pending.  The patient was administered broad-spectrum antibiotics with IV cefepime and IV vancomycin.  In the setting of her pneumonia, concern for sepsis with a respiratory source from her pneumonia.  X-ray imaging and CT imaging was performed which revealed a multilobar pneumonia, focal consolidation worst in the left lower lobe.  Additional finding of a Acute/subacute T6 compression fracture with approximately 50% height loss which is new since 11/29/2021 with remote superior endplate fractures at T9 and T11.  No intra thoracic or intra-abdominal traumatic injuries noted on imaging.  Patient CT head and CT cervical spine were without acute intracranial abnormality or fracture or malalignment of the cervical spine.  Due to the patient's new fracture of T6, consult to neurosurgery was placed.  Hospitalist medicine was consulted for admission for further monitoring of the patient's acute illness/sepsis in the setting of her bacterial pneumonia.   Final Clinical Impression(s) / ED Diagnoses Final diagnoses:  Sepsis, due to unspecified organism,  unspecified whether acute organ dysfunction present (HCC)  Pneumonia of left lower lobe due to infectious organism  Altered mental status, unspecified altered mental status type  Closed wedge compression fracture of T6 vertebra, initial encounter [facility])    Rx / DC Orders ED Discharge Orders     None         Ernie Avena, MD 12/04/21 1322

## 2021-12-04 NOTE — Progress Notes (Signed)
[facility] is following Sepsis bundle   

## 2021-12-04 NOTE — H&P (Signed)
History and Physical    Nancy [name] [id number] DOB: 22-Jan-1951 DOA: 12/04/2021  PCP: Pcp, No   Patient coming from: Home.   I have personally briefly reviewed patient's old medical records in [place]  Chief Complaint: Cough and shortness of breath.  HPI: Nancy [name] is a 71 y.o. female with medical history significant of dementia, hyperlipidemia, hypertension who was sent from her facility due to cough for the past 3 weeks that seems to be getting a lot worse.  She was given azithromycin without significant results.  She is obtunded and unable to provide further history at this time.  ED Course: Initial vital signs were temperature 98 F, pulse 126, respiration 18, BP 140/70 mmHg O2 sat 100% on room air.  The patient received LR bolus, cefepime and vancomycin.  ED provider checked CODE STATUS with son and confirmed that she is a full code.  ED provider discussed T6 compression fracture finding with neurosurgery on-call who suggested conservative treatment.  Lab work: Urinalysis shows small hemoglobinuria and trace leukocyte esterase.  It was otherwise unremarkable.  CBC shows a white count of 17.3 with 88% neutrophils, hemoglobin 11.4 g/dL and platelets 542.  PT 15.0, INR 1.2 and PTT 23.  Lactic acid was normal twice.  Ammonia, alcohol and TSH level were normal.  Troponin was 293 and then 316 ng/L.  BNP was 210.9 pg/mL.  Imaging: Portable 1 view chest radiograph show persistent patchy airspace disease in left base compatible with pneumonia.  CT head no acute or traumatic finding.  CT cervical spine showed multilevel DDD.  CT thoracic spine show compression fracture at T6.  There was advanced kyphosis of the thoracic spine.  Advanced DDD of the lumbar spine.  CTA chest no PE, extensive bilateral airspace disease compatible with multilobar pneumonia.  Small left pleural effusion.  Please see images and full radiology report for further details.  Review of Systems: As per HPI  otherwise all other systems reviewed and are negative.  Past Medical History:  Diagnosis Date   Dementia ([place])    Hyperlipidemia    Hypertension     History reviewed. No pertinent surgical history.  Social History  reports that she does not drink alcohol and does not use drugs. No history on file for tobacco use.  Allergies  Allergen Reactions   Cefdinir Diarrhea    Patient states this medication makes her feel weird as well as having diarrhea   Unable to obtain family medical history at this time.   Prior to Admission medications   Medication Sig Start Date End Date Taking? Authorizing Provider  alum & mag hydroxide-simeth (MAALOX/MYLANTA) 200-200-20 MG/5ML suspension Take 30 mLs by mouth every 6 (six) hours as needed for indigestion or heartburn.    [provider]  citalopram (CELEXA) 10 MG tablet Take 10 mg by mouth daily. 07/20/20   [provider]  divalproex (DEPAKOTE) 125 MG DR tablet Take 125 mg by mouth daily.    [provider]  guaifenesin (ROBITUSSIN) 100 MG/5ML syrup Take 200 mg by mouth 4 (four) times daily as needed for cough.    [provider]  ibuprofen (ADVIL) 400 MG tablet Take 400 mg by mouth every 6 (six) hours as needed for fever, headache, mild pain, moderate pain or cramping.  02/03/20   [provider]  levofloxacin (LEVAQUIN) 750 MG tablet Take 1 tablet (750 mg total) by mouth daily for 7 days. 11/29/21 12/06/21  Smoot, Leary Roca, PA-C  loperamide (IMODIUM) 2 MG  capsule Take 2 mg by mouth as needed for diarrhea or loose stools. 1 cap as needed with loose stool/diarrhea,max 8 doses in 24 hrs    [provider]  OLANZapine (ZYPREXA) 10 MG tablet Take 10 mg by mouth daily. 08/17/20   [provider]  PRESCRIPTION MEDICATION Take 1 each by mouth in the morning, at noon, and at bedtime. Mighty Shakes    [provider]    Physical Exam: Vitals:   12/04/21 0830 12/04/21 1015 12/04/21 1215  12/04/21 1245  BP: (!) 158/99 (!) 142/86 (!) 156/87 (!) 146/84  Pulse: (!) 129 (!) 136 (!) 137 (!) 141  Resp:  (!) 24 (!) 25 (!) 26  Temp:      TempSrc:      SpO2: 94% 95% 93% 94%    Constitutional: Looks acutely ill. Eyes: PERRL, lids and conjunctivae normal.  Bilateral scleral injection. ENMT: Mucous membranes are dry.  Posterior pharynx clear of any exudate or lesions. Neck: normal, supple, no masses, no thyromegaly Respiratory: Mildly tachypneic.  Decreased breath sounds bilaterally, particularly at the bases with rhonchi and scattered crackles.  No accessory muscle use.  Cardiovascular: Sinus tachycardia in the 120s and 130s, no murmurs / rubs / gallops. No extremity edema. 2+ pedal pulses. No carotid bruits.  Abdomen: No distention.  Soft, no tenderness, no masses palpated. No hepatosplenomegaly. Bowel sounds positive.  Musculoskeletal: no clubbing / cyanosis.  Positive kyphosis.  Good ROM, no contractures. Normal muscle tone.  Skin: no acute rashes, lesions, ulcers on very limited dermatological semination. Neurologic: Grossly nonfocal on limited examination. Psychiatric: Obtunded.  Unable to answer any questions.  Labs on Admission: I have personally reviewed following labs and imaging studies  CBC: Recent Labs  Lab 11/29/21 1619 12/04/21 0901  WBC 9.9 17.3*  NEUTROABS 6.7 15.1*  HGB 10.1* 11.4*  HCT 31.6* 36.1  MCV 88.3 89.8  PLT 602* 542*    Basic Metabolic Panel: Recent Labs  Lab 11/29/21 1619 12/04/21 0901  NA 138 140  K 3.6 3.6  CL 107 110  CO2 24 24  GLUCOSE 110* 124*  BUN 11 9  CREATININE 0.82 0.96  CALCIUM 7.9* 8.2*    GFR: CrCl cannot be calculated (Unknown ideal weight.).  Liver Function Tests: Recent Labs  Lab 11/29/21 1619 12/04/21 0901  AST 71* 38  ALT 68* 38  ALKPHOS 125 106  BILITOT 0.7 0.6  PROT 7.4 7.2  ALBUMIN 2.8* 3.1*    Urine analysis:    Component Value Date/Time   COLORURINE YELLOW 11/29/2021 1838   APPEARANCEUR  CLEAR 11/29/2021 1838   LABSPEC 1.020 11/29/2021 1838   PHURINE 6.0 11/29/2021 1838   GLUCOSEU NEGATIVE 11/29/2021 1838   HGBUR MODERATE (A) 11/29/2021 1838   BILIRUBINUR NEGATIVE 11/29/2021 1838   KETONESUR 5 (A) 11/29/2021 1838   PROTEINUR NEGATIVE 11/29/2021 1838   NITRITE NEGATIVE 11/29/2021 1838   LEUKOCYTESUR NEGATIVE 11/29/2021 1838    Radiological Exams on Admission: CT HEAD WO CONTRAST (5MM)  Result Date: 12/04/2021 CLINICAL DATA:  Head trauma, minor EXAM: CT HEAD WITHOUT CONTRAST TECHNIQUE: Contiguous axial images were obtained from the base of the skull through the vertex without intravenous contrast. COMPARISON:  11/29/2021 FINDINGS: Brain: Today's study suffers from motion degradation. There is brain atrophy as seen previously. No sign of acute infarction, mass lesion, hemorrhage, hydrocephalus or extra-axial collection. Vascular: There is atherosclerotic calcification of the major vessels at the base of the brain. Skull: Negative, allowing for motion degradation. Sinuses/Orbits: Clear/negative Other: 9  IMPRESSION: Motion degraded study.  No acute or traumatic finding. Electronically Signed   By: Nelson Chimes M.D.   On: 12/04/2021 11:54   CT Angio Chest PE W and/or Wo Contrast  Result Date: 12/04/2021 CLINICAL DATA:  Pulmonary embolism suspected, high probability. Abdominal trauma, blunt. EXAM: CT ANGIOGRAPHY CHEST CT ABDOMEN AND PELVIS WITH CONTRAST TECHNIQUE: Multidetector CT imaging of the chest was performed using the standard protocol during bolus administration of intravenous contrast. Multiplanar CT image reconstructions and MIPs were obtained to evaluate the vascular anatomy. Multidetector CT imaging of the abdomen and pelvis was performed using the standard protocol during bolus administration of intravenous contrast. CONTRAST:  37mL OMNIPAQUE IOHEXOL 350 MG/ML SOLN COMPARISON:  Two-view chest x-ray 11/29/2021 and one-view chest x-ray 12/04/2021 FINDINGS: CTA CHEST FINDINGS  Cardiovascular: Heart size is normal. Atherosclerotic calcifications are present at the aortic arch great vessel origins. No stenosis or aneurysm is present. No acute vascular injury is present. Pulmonary arteries demonstrate excellent opacification. No focal filling defects are present to suggest pulmonary emboli. Pulmonary artery size is normal. Mediastinum/Nodes: No enlarged mediastinal, hilar, or axillary lymph nodes. Thyroid gland, trachea, and esophagus demonstrate no significant findings. Lungs/Pleura: Patchy airspace opacities are present in the inferior upper lobes bilaterally. Patchy airspace opacities are present inferiorly in the upper lobes bilaterally and in the right middle lobe. More extensive patchy airspace consolidation is present the right lung base. More dense consolidation is present in the medial and posterior left lower lobe. A small left pleural effusion is present. The airways are patent. Musculoskeletal: The T6 compression fracture is new since 11/29/2021. Approximately 50% loss of height is present without significant retropulsed bone. Remote superior endplate fractures are present at T9 and T11. No focal osseous lesions are present. No chest wall mass scratched at no chest wall lesions are present. Ribs are within normal limits. Review of the MIP images confirms the above findings. CT ABDOMEN and PELVIS FINDINGS Hepatobiliary: Simple cyst in the right lobe of the liver on image 10 of series 2 measures 11 mm. No acute trauma is present. The common bile duct gallbladder normal. Pancreas: Unremarkable. No pancreatic ductal dilatation or surrounding inflammatory changes. Spleen: Splenic cyst measures 15 mm. Spleen is otherwise unremarkable. Adrenals/Urinary Tract: Adrenal glands are unremarkable. Kidneys are normal, without renal calculi, focal lesion, or hydronephrosis. Bladder is unremarkable. Stomach/Bowel: The stomach and duodenum are within normal limits. Small bowel is unremarkable.  There is some stranding in mesentery. Terminal ileum is within normal limits. Appendix is visualized and within normal limits. The ascending and transverse colon are normal. Descending and sigmoid colon are within normal limits. Vascular/Lymphatic: Atherosclerotic calcifications are present in the aorta branch vessels without aneurysm. No significant adenopathy is present. Reproductive: Uterus and bilateral adnexa are unremarkable. Other: No abdominal wall hernia or abnormality. No abdominopelvic ascites. Musculoskeletal: Grade 1 degenerative anterolisthesis is present at L4-5. Chronic endplate changes are worse right than left associated with curvature of the spine at L5-S1. Bony pelvis is within normal limits. The hips are located and normal. Review of the MIP images confirms the above findings. IMPRESSION: 1. No pulmonary embolus. 2. Extensive bilateral airspace disease compatible with multi lobar pneumonia. The most prominent area of consolidation is in the left lower lobe. Aspiration considered less likely. 3. Small left pleural effusion. 4. Acute/subacute T6 compression fracture with approximately 50% loss of height. This is new since 11/29/2021. 5. Remote superior endplate fractures at T9 and T11. 6. No acute trauma to the abdomen or pelvis. 7.  Benign-appearing cysts of the liver and spleen. 8. Aortic Atherosclerosis (ICD10-I70.0). Electronically Signed   By: San Morelle M.D.   On: 12/04/2021 12:08   CT CERVICAL SPINE WO CONTRAST  Result Date: 12/04/2021 CLINICAL DATA:  Neck trauma EXAM: CT CERVICAL SPINE WITHOUT CONTRAST TECHNIQUE: Multidetector CT imaging of the cervical spine was performed without intravenous contrast. Multiplanar CT image reconstructions were also generated. COMPARISON:  06/19/2021 FINDINGS: Alignment: Minimal curvature convex to the right. Skull base and vertebrae: No evidence of regional fracture or focal bone lesion. Soft tissues and spinal canal: Negative Disc levels:  Ordinary spondylosis C4-5 through C6-7. Mild facet osteoarthritis. Bony foraminal encroachment by osteophytes on the right at C5-6 and C6-7 and on the left at C4-5, C5-6 and C6-7. Upper chest: Negative Other: None IMPRESSION: No acute or traumatic finding. Ordinary mid cervical spondylosis as described. Electronically Signed   By: Nelson Chimes M.D.   On: 12/04/2021 11:55   CT ABDOMEN PELVIS W CONTRAST  Result Date: 12/04/2021 CLINICAL DATA:  Pulmonary embolism suspected, high probability. Abdominal trauma, blunt. EXAM: CT ANGIOGRAPHY CHEST CT ABDOMEN AND PELVIS WITH CONTRAST TECHNIQUE: Multidetector CT imaging of the chest was performed using the standard protocol during bolus administration of intravenous contrast. Multiplanar CT image reconstructions and MIPs were obtained to evaluate the vascular anatomy. Multidetector CT imaging of the abdomen and pelvis was performed using the standard protocol during bolus administration of intravenous contrast. CONTRAST:  78mL OMNIPAQUE IOHEXOL 350 MG/ML SOLN COMPARISON:  Two-view chest x-ray 11/29/2021 and one-view chest x-ray 12/04/2021 FINDINGS: CTA CHEST FINDINGS Cardiovascular: Heart size is normal. Atherosclerotic calcifications are present at the aortic arch great vessel origins. No stenosis or aneurysm is present. No acute vascular injury is present. Pulmonary arteries demonstrate excellent opacification. No focal filling defects are present to suggest pulmonary emboli. Pulmonary artery size is normal. Mediastinum/Nodes: No enlarged mediastinal, hilar, or axillary lymph nodes. Thyroid gland, trachea, and esophagus demonstrate no significant findings. Lungs/Pleura: Patchy airspace opacities are present in the inferior upper lobes bilaterally. Patchy airspace opacities are present inferiorly in the upper lobes bilaterally and in the right middle lobe. More extensive patchy airspace consolidation is present the right lung base. More dense consolidation is present in  the medial and posterior left lower lobe. A small left pleural effusion is present. The airways are patent. Musculoskeletal: The T6 compression fracture is new since 11/29/2021. Approximately 50% loss of height is present without significant retropulsed bone. Remote superior endplate fractures are present at T9 and T11. No focal osseous lesions are present. No chest wall mass scratched at no chest wall lesions are present. Ribs are within normal limits. Review of the MIP images confirms the above findings. CT ABDOMEN and PELVIS FINDINGS Hepatobiliary: Simple cyst in the right lobe of the liver on image 10 of series 2 measures 11 mm. No acute trauma is present. The common bile duct gallbladder normal. Pancreas: Unremarkable. No pancreatic ductal dilatation or surrounding inflammatory changes. Spleen: Splenic cyst measures 15 mm. Spleen is otherwise unremarkable. Adrenals/Urinary Tract: Adrenal glands are unremarkable. Kidneys are normal, without renal calculi, focal lesion, or hydronephrosis. Bladder is unremarkable. Stomach/Bowel: The stomach and duodenum are within normal limits. Small bowel is unremarkable. There is some stranding in mesentery. Terminal ileum is within normal limits. Appendix is visualized and within normal limits. The ascending and transverse colon are normal. Descending and sigmoid colon are within normal limits. Vascular/Lymphatic: Atherosclerotic calcifications are present in the aorta branch vessels without aneurysm. No significant adenopathy is present. Reproductive: Uterus  and bilateral adnexa are unremarkable. Other: No abdominal wall hernia or abnormality. No abdominopelvic ascites. Musculoskeletal: Grade 1 degenerative anterolisthesis is present at L4-5. Chronic endplate changes are worse right than left associated with curvature of the spine at L5-S1. Bony pelvis is within normal limits. The hips are located and normal. Review of the MIP images confirms the above findings. IMPRESSION:  1. No pulmonary embolus. 2. Extensive bilateral airspace disease compatible with multi lobar pneumonia. The most prominent area of consolidation is in the left lower lobe. Aspiration considered less likely. 3. Small left pleural effusion. 4. Acute/subacute T6 compression fracture with approximately 50% loss of height. This is new since 11/29/2021. 5. Remote superior endplate fractures at T9 and T11. 6. No acute trauma to the abdomen or pelvis. 7. Benign-appearing cysts of the liver and spleen. 8. Aortic Atherosclerosis (ICD10-I70.0). Electronically Signed   By: San Morelle M.D.   On: 12/04/2021 12:08   CT T-SPINE NO CHARGE  Result Date: 12/04/2021 CLINICAL DATA:  Fall, altered mental status EXAM: CT LUMBAR SPINE WITHOUT CONTRAST TECHNIQUE: Multidetector CT imaging of the lumbar spine was performed without intravenous contrast administration. Multiplanar CT image reconstructions were also generated. COMPARISON:  None. FINDINGS: Thoracic spine. Alignment: Advanced kyphosis of the thoracic spine. Vertebrae: Wedge-shaped compression deformity of T6 vertebral body with approximately 50% anterior vertebral body height loss. There is no surrounding edema. No retropulsion into the spinal canal. Paraspinal and other soft tissues: No evidence of hematoma or significant soft tissue abnormality. Left lung opacity concerning for pneumonia. Disc levels: Advanced degenerate disc disease. No significant spinal canal narrowing. Lumbar spine: Segmentation: 5 lumbar type vertebrae. Alignment: Grade 1 anterolisthesis of L4. Vertebrae: No acute fracture or focal pathologic process. Paraspinal and other soft tissues: Negative. Disc levels: Disc osteophyte complex with prominent osteophytes at L5-S1. Moderate spinal canal and right neural foraminal narrowing. Multilevel facet joint arthropathy. IMPRESSION: 1. Wedge-shaped compression deformity of T6 vertebral body with approximately 50% anterior vertebral body height loss  without surrounding edema, likely a chronic process. 2. Advanced kyphosis of the thoracic spine. 3. 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 No acute lumbar fracture or significant soft tissue abnormality. Electronically Signed   By: Keane Police D.O.   On: 12/04/2021 13:03   DG Chest Port 1 View  Result Date: 12/04/2021 CLINICAL DATA:  Altered mental status. EXAM: PORTABLE CHEST 1 VIEW COMPARISON:  08/30/2021 FINDINGS: 0912 hours. Lungs are hyperexpanded. Patchy airspace disease again noted left base compatible with pneumonia. No pulmonary edema or substantial pleural effusion. Bones are diffusely demineralized. Telemetry leads overlie the chest. IMPRESSION: Persistent patchy airspace disease left base compatible with pneumonia. Electronically Signed   By: Misty Stanley M.D.   On: 12/04/2021 09:22    EKG: Independently reviewed.  Vent. rate 119 BPM PR interval 167 ms QRS duration 84 ms QT/QTcB 321/452 ms P-R-T axes 30 10 93 Sinus tachycardia Anteroseptal infarct, old Borderline repolarization abnormality  Assessment/Plan Principal Problem:   Sepsis due to pneumonia ([place]) Admit to PCU with/inpatient. Continue IV fluids. Continue cefepime per pharmacy. Continue vancomycin per pharmacy. Check sputum gram stain, culture and sensitivity.   Check strep pneumoniae urinary antigen. Follow-up blood culture and sensitivity. Follow-up chemistry and CBC in the morning.  Active Problems:   Thoracic compression fracture [facility]) Neurosurgery on-call told Dr. Armandina Gemma  No need for procedures  treat conservatively     Elevated troponin Secondary to demand ischemia. Repeat troponin level to see trend. If it rises over 500  ng/L will consult cardiology.    Hypertension Metoprolol 5 mg IVP every 8 hours while NPO. Monitor blood pressure and heart rate.    Dementia (Nancy [name]) Supportive care.    Dysphagia Unable to swallow pills. Consult SLP.     Thrombocytosis Monitor platelet count.    Normocytic anemia Monitor hematocrit and hemoglobin.    Mild protein malnutrition (HCC) Protein supplementation. Consider nutritional services evaluation.     Hyperlipidemia Not on medical therapy.   DVT prophylaxis: Lovenox SQ. Code Status:   Full code. Family Communication:   Disposition Plan:   Patient is from:  SNF.  Anticipated DC to:  SNF.  Anticipated DC date:  12/07/2021.  Anticipated DC barriers: Clinical status. Consults called:   Admission status:  Inpatient/PCU.  Severity of Illness: High severity in the setting of sepsis due to pneumonia.  Reubin Milan MD [facility]  How to contact the [facility] Attending or Consulting provider [place] or covering provider during after hours [place], for this patient?   Check the care team in [facility] and look for a) attending/consulting TR[facility] provider listed and b) the [facility] team listed Log into [email or web address] and use [place]'s universal password to access. If you do not have the password, please contact the hospital operator. Locate the [facility] provider you are looking for under [facility] and page to a number that you can be directly reached. If you still have difficulty reaching the provider, please page the [facility] (Director on Call) for the Hospitalists listed on amion for assistance.  12/04/2021, 1:08 PM   This document was prepared using Dragon voice recognition software and may contain some unintended transcription errors.

## 2021-12-04 NOTE — ED Triage Notes (Signed)
Patient BIBA from [facility]. Per EMS she had an unwitnessed fall. They also reported increasing AMS.  Patient being treated for pneumonia w/levofloxacin.  Hx of dementia, Alert to self. Complains of neck and back pain.  BP:138/64 RR: 28 HR: 172 EtCO: 26

## 2021-12-04 NOTE — Progress Notes (Addendum)
Pharmacy Antibiotic Note  Nancy [name] is a 70 y.o. female admitted on 12/04/2021 with sepsis due to pneumonia.  Pharmacy has been consulted for vancomycin and cefepime dosing with a duration of 7 days.  Antibiotics administered in ED: 1/3 0921 cefepime 2 g IV 1/3 1006 vancomycin 1 g IV  Plan: Continue cefepime 2 g IV every 12 hours Continue vancomycin 750 mg IV Q 24 hrs (Goal AUC 400-550. Expected AUC: 454.2. SCr used: 0.96) Monitor clinical progress, renal function, vancomycin levels if indicated F/U weight for confirmation of dose, C&S, abx deescalation / LOT       Temp (24hrs), Avg:98 F (36.7 C), Min:98 F (36.7 C), Max:98 F (36.7 C)  Recent Labs  Lab 11/29/21 1619 12/04/21 0901 12/04/21 1224  WBC 9.9 17.3*  --   CREATININE 0.82 0.96  --   LATICACIDVEN  --  1.7 1.1    CrCl cannot be calculated (Unknown ideal weight.).  Last recorded weight 54.4 kg (03/24/2020) 12/04/2021 est crcl using Scr 0.96 and wt 54.4 kg = 45 ml/min   Allergies  Allergen Reactions   Cefdinir Diarrhea    Patient states this medication makes her feel weird as well as having diarrhea    Antimicrobials this admission: 1/3 cefepime >>  1/3 vancomycin >>   Dose adjustments this admission:   Microbiology results: 1/3 BCx: sent 1/3 UCx: to be collected  1/3 MRSA PCR: ordered  Thank you for allowing pharmacy to be a part of this patients care.  Selinda Eon, PharmD, BCPS Clinical Pharmacist [place] Please utilize Amion for appropriate phone number to reach the unit pharmacist [facility]) 12/04/2021 1:28 PM  Addendum: Today's actual weight = 63.5kg.   Will increase vancomycin to 1000 mg IV every 24 hours for calculated AUC 456.1

## 2021-12-04 NOTE — ED Notes (Addendum)
The patient is not able to swallow any water via straw, trouble following commands. Unable to administer the ordered PO meds. This RN wanting to place order for SLP swallow study.  Dr. Robb Matar informed via epic secure chat.

## 2021-12-04 NOTE — Progress Notes (Signed)
A consult was received from an ED physician for Vancomycin & Cefepime per pharmacy dosing for sepsis.  The patient's profile has been reviewed for ht/wt/allergies/indication/available labs.    A one time order has been placed for Cefepime 2gm & Vancomycin 1g - TBW unknown.  Further antibiotics/pharmacy consults should be ordered by admitting physician if indicated.                       Thank you,  Otho Bellows PharmD 12/04/2021  8:47 AM

## 2021-12-04 NOTE — ED Notes (Signed)
Pt has on a purewick 

## 2021-12-05 ENCOUNTER — Inpatient Hospital Stay (HOSPITAL_COMMUNITY): Payer: Medicare HMO

## 2021-12-05 DIAGNOSIS — L89151 Pressure ulcer of sacral region, stage 1: Secondary | ICD-10-CM

## 2021-12-05 DIAGNOSIS — I10 Essential (primary) hypertension: Secondary | ICD-10-CM

## 2021-12-05 DIAGNOSIS — A419 Sepsis, unspecified organism: Secondary | ICD-10-CM | POA: Diagnosis not present

## 2021-12-05 DIAGNOSIS — J189 Pneumonia, unspecified organism: Secondary | ICD-10-CM

## 2021-12-05 DIAGNOSIS — E785 Hyperlipidemia, unspecified: Secondary | ICD-10-CM

## 2021-12-05 DIAGNOSIS — S22050A Wedge compression fracture of T5-T6 vertebra, initial encounter for closed fracture: Secondary | ICD-10-CM

## 2021-12-05 DIAGNOSIS — R778 Other specified abnormalities of plasma proteins: Secondary | ICD-10-CM

## 2021-12-05 DIAGNOSIS — E440 Moderate protein-calorie malnutrition: Secondary | ICD-10-CM

## 2021-12-05 DIAGNOSIS — L8990 Pressure ulcer of unspecified site, unspecified stage: Secondary | ICD-10-CM | POA: Insufficient documentation

## 2021-12-05 DIAGNOSIS — F03C0 Unspecified dementia, severe, without behavioral disturbance, psychotic disturbance, mood disturbance, and anxiety: Secondary | ICD-10-CM | POA: Diagnosis not present

## 2021-12-05 DIAGNOSIS — R079 Chest pain, unspecified: Secondary | ICD-10-CM

## 2021-12-05 DIAGNOSIS — D649 Anemia, unspecified: Secondary | ICD-10-CM

## 2021-12-05 DIAGNOSIS — D75839 Thrombocytosis, unspecified: Secondary | ICD-10-CM

## 2021-12-05 LAB — COMPREHENSIVE METABOLIC PANEL
ALT: 32 U/L (ref 0–44)
AST: 33 U/L (ref 15–41)
Albumin: 2.7 g/dL — ABNORMAL LOW (ref 3.5–5.0)
Alkaline Phosphatase: 91 U/L (ref 38–126)
Anion gap: 4 — ABNORMAL LOW (ref 5–15)
BUN: 5 mg/dL — ABNORMAL LOW (ref 8–23)
CO2: 27 mmol/L (ref 22–32)
Calcium: 7.9 mg/dL — ABNORMAL LOW (ref 8.9–10.3)
Chloride: 106 mmol/L (ref 98–111)
Creatinine, Ser: 0.82 mg/dL (ref 0.44–1.00)
GFR, Estimated: >60 mL/min (ref >60)
Glucose, Bld: 97 mg/dL (ref 70–99)
Potassium: 3.6 mmol/L (ref 3.5–5.1)
Sodium: 137 mmol/L (ref 135–145)
Total Bilirubin: 0.7 mg/dL (ref 0.3–1.2)
Total Protein: 6.5 g/dL (ref 6.5–8.1)

## 2021-12-05 LAB — CBC WITH DIFFERENTIAL/PLATELET
Abs Immature Granulocytes: 0.06 10*3/uL (ref 0.00–0.07)
Basophils Absolute: 0 10*3/uL (ref 0.0–0.1)
Basophils Relative: 0 %
Eosinophils Absolute: 0 10*3/uL (ref 0.0–0.5)
Eosinophils Relative: 0 %
HCT: 34.3 % — ABNORMAL LOW (ref 36.0–46.0)
Hemoglobin: 10.9 g/dL — ABNORMAL LOW (ref 12.0–15.0)
Immature Granulocytes: 1 %
Lymphocytes Relative: 17 %
Lymphs Abs: 1.7 10*3/uL (ref 0.7–4.0)
MCH: 28.3 pg (ref 26.0–34.0)
MCHC: 31.8 g/dL (ref 30.0–36.0)
MCV: 89.1 fL (ref 80.0–100.0)
Monocytes Absolute: 0.8 10*3/uL (ref 0.1–1.0)
Monocytes Relative: 7 %
Neutro Abs: 7.5 10*3/uL (ref 1.7–7.7)
Neutrophils Relative %: 75 %
Platelets: 462 10*3/uL — ABNORMAL HIGH (ref 150–400)
RBC: 3.85 MIL/uL — ABNORMAL LOW (ref 3.87–5.11)
RDW: 14.4 % (ref 11.5–15.5)
WBC: 10.1 10*3/uL (ref 4.0–10.5)
nRBC: 0 % (ref 0.0–0.2)

## 2021-12-05 LAB — ECHOCARDIOGRAM COMPLETE
Area-P 1/2: 6.93 cm2
Calc EF: 63.9 %
S' Lateral: 2.4 cm
Single Plane A2C EF: 62.8 %
Single Plane A4C EF: 65.6 %
Weight: 1936.52 oz

## 2021-12-05 MED ORDER — CHLORHEXIDINE GLUCONATE CLOTH 2 % EX PADS
6.0000 | MEDICATED_PAD | Freq: Every day | CUTANEOUS | Status: DC
  Administered 2021-12-06 (×2): 6 via TOPICAL

## 2021-12-05 MED ORDER — LORAZEPAM 2 MG/ML IJ SOLN
0.5000 mg | Freq: Once | INTRAMUSCULAR | Status: AC
  Administered 2021-12-05: 0.5 mg via INTRAVENOUS
  Filled 2021-12-05: qty 1

## 2021-12-05 MED ORDER — CHLORHEXIDINE GLUCONATE CLOTH 2 % EX PADS: 6.0000 | MEDICATED_PAD | Freq: Every day | CUTANEOUS | Status: DC

## 2021-12-05 MED ORDER — LACTATED RINGERS IV SOLN: INTRAVENOUS | Status: DC

## 2021-12-05 MED ORDER — LORAZEPAM 2 MG/ML IJ SOLN
0.5000 mg | Freq: Four times a day (QID) | INTRAMUSCULAR | Status: DC | PRN
  Administered 2021-12-05 – 2021-12-08 (×4): 0.5 mg via INTRAVENOUS
  Filled 2021-12-05 (×5): qty 1

## 2021-12-05 NOTE — Progress Notes (Signed)
Initial Nutrition Assessment  DOCUMENTATION CODES:   Non-severe (moderate) malnutrition in context of chronic illness  INTERVENTION:  - diet to be per SLP recommendations.  - will provide appropriate interventions once safe diet is known.    NUTRITION DIAGNOSIS:   Moderate Malnutrition related to chronic illness (dementia) as evidenced by mild fat depletion, mild muscle depletion, moderate muscle depletion.  GOAL:   Patient will meet greater than or equal to 90% of their needs  MONITOR:   PO intake, Supplement acceptance, Labs, Weight trends  REASON FOR ASSESSMENT:   Malnutrition Screening Tool  ASSESSMENT:   71 y.o. female with medical history of dementia, HLD, and HTN. She presented to the ED from facility due to cough x3 weeks that seemed to be worsening.  Patient noted to be a/o to self only. She was sleeping with no visitors present at the time of visit. She did not awake to name call x1 and shoulder rub x1 or during NFPE.   Able to talk with RN who shares patient has been sleeping throughout most of shift and is pending SLP evaluation.  She has not been seen by a [place] RD at any time in the past.  Note in [facility]ST report states that patient gets protein shakes at facility and that she likes ice cream and that she has lost approximately 20 lb in the past 2 years.  Weight today is 121 lb and weight on 03/23/20 was 120 lb.   Per notes: - sepsis d/t PNA - thoracic compression fracture - dysphagia with inability to swallow pills   Labs reviewed; BUN: 5 mg/dl, Ca: 7.9 mg/dl. Medications reviewed.      NUTRITION - FOCUSED PHYSICAL EXAM:  Flowsheet Row Most Recent Value  Orbital Region Mild depletion  Upper Arm Region Mild depletion  Thoracic and Lumbar Region Unable to assess  Buccal Region Mild depletion  Temple Region Moderate depletion  Clavicle Bone Region Mild depletion  Clavicle and Acromion Bone Region Moderate depletion  Scapular Bone Region Unable  to assess  Dorsal Hand Mild depletion  Patellar Region Mild depletion  Anterior Thigh Region Mild depletion  Posterior Calf Region Moderate depletion  Edema (RD Assessment) None  Hair Reviewed  Eyes Unable to assess  Mouth Unable to assess  Skin Reviewed  Nails Reviewed       Diet Order:   Diet Order             Diet Heart Room service appropriate? Yes; Fluid consistency: Thin  Diet effective now                   EDUCATION NEEDS:   Not appropriate for education at this time  Skin:  Skin Assessment: Skin Integrity Issues: Skin Integrity Issues:: Stage I Stage I: sacrum  Last BM:  PTA/unknown  Height:   Ht Readings from Last 1 Encounters:  06/19/21 5' 7.5" (1.715 m)    Weight:   Wt Readings from Last 1 Encounters:  12/05/21 54.9 kg    Estimated Nutritional Needs:  Kcal:  1650-1850 kcal Protein:  75-90 grams Fluid:  >/= 1.8 L/day     Trenton Gammon, MS, RD, LDN Inpatient Clinical Dietitian RD pager # available in AMION  After hours/weekend pager # available in [facility]

## 2021-12-05 NOTE — ED Notes (Signed)
Pt is more awake, attempting to get out of bed. Bed alarm placed and turned on.

## 2021-12-05 NOTE — Progress Notes (Signed)
°  Echocardiogram 2D Echocardiogram has been performed.  Nancy [name] 12/05/2021, 12:15 PM

## 2021-12-05 NOTE — Progress Notes (Signed)
2D echo attempted, patient sleeping. RN request to come back later for echo

## 2021-12-05 NOTE — Progress Notes (Signed)
SLP Cancellation Note  Patient Details Name: Milan Carpinelli MRN: [id number] DOB: 13-Aug-1951   Cancelled treatment:       Reason Eval/Treat Not Completed: Patient's level of consciousness. Attempted x2. RN aware.    Mirca Yale, Katherene Ponto 12/05/2021, 12:49 PM

## 2021-12-05 NOTE — Progress Notes (Signed)
PROGRESS NOTE  Nancy [name] [id number] DOB: March 14, 1951 DOA: 12/04/2021 PCP: Pcp, No   LOS: 1 day   Brief narrative:  Nancy [name] is a 71 years old female with past medical history of dementia, hyperlipidemia, hypertension who was sent from the skilled nursing facility for worsening cough for the last 3 weeks not improved with azithromycin at the facility.  In the ED, patient was noted to have stable vitals.  Urinalysis showed hemoglobinuria and trace leukocyte esterase.  CBC showed white count of 17.3 with 88% neutrophils.  Lactic acid was elevated.  Troponin was 293 followed by 316.  BNP was elevated at 210.  Patient was also noted to have a T6 compression fracture with advanced kyphosis and neurosurgery was consulted who recommended conservative treatment.  Portable chest x-ray showed patchy airspace opacity in the left lung base compatible with pneumonia.  CT of the chest showed extensive bilateral airspace disease compatible with multifocal pneumonia but no pulmonary embolism.  Patient was then admitted hospital for further evaluation and treatment.  Assessment/Plan:  Principal Problem:   Sepsis due to pneumonia [facility]) Active Problems:   Hyperlipidemia   Hypertension   Dementia (HCC)   Thrombocytosis   Normocytic anemia   Mild protein malnutrition (HCC)   Elevated troponin   Thoracic compression fracture (HCC)   Pressure injury of skin   Malnutrition of moderate degree   Sepsis due to pneumonia with metabolic encephalopathy on presentation. CT scan with multifocal pneumonia.  Follow streptococcal urinary antigen Follow sputum and blood culture and sensitivity.  Continue vancomycin and cefepime.  WBC today at 10.1.  MRSA PCR was negative.  COVID and influenza was negative.  Leukocytosis improved.  Temperature max of 98.5 F.  Patient was reportedly very agitated disoriented trying to get out of the bed and pulling lines so received 1 dose of 0.5 mg of Ativan.  At this time,  patient appears to be sedated.  We will renew IV fluids for 1 more day due to encephalopathy.  Mild volume depletion.  Continue with IV fluids.  Reassess fluid needs in AM.  T6 thoracic compression fracture/ history of kyphosis. Neurosurgery on-call recommended conservative treatment.    Elevated troponin Likely secondary to demand ischemia.  Patient with underlying sepsis and pneumonia.  Elevated BNP at 210.  No report of chest pain.  We will however get 2D echocardiogram to assess for regional wall motion abnormality.  History of hypertension On IV metoprolol while  due to encephalopathy.  Closely monitor blood pressure.   Baseline dementia with dysphagia. As per the son patient's dementia has been progressing over the last 2 years, was on medications like trazodone makes her really sedated. Continue supportive care.  Speech and swallow evaluation has been consulted consulted.  Keep n.p.o. if unsafe. 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      Thrombocytosis Continue to monitor.  Check CBC in AM.     Normocytic anemia Hemoglobin of 10.9.  Will monitor.   Moderate protein malnutrition  Present on admission.  Seen by dietitian continue protein supplementation when p.o. able..     Hyperlipidemia Not on medical therapy.  Pressure injury.  Sacral stage I ulceration present on admission.  Continue prevention protocol.  Pressure Injury 12/05/21 Sacrum Mid Stage 1 -  Intact skin with non-blanchable redness of a localized area usually over a bony prominence. red, blanchable (Active)  12/05/21 0640  Location: Sacrum  Location Orientation: Mid  Staging: Stage 1 -  Intact skin with non-blanchable  redness of a localized area usually over a bony prominence.  Wound Description (Comments): red, blanchable  Present on Admission: Yes   Disposition.  Patient is from memory care unit..  We will get PT evaluation prior to discharge.  Will likely need a skilled nursing facility/memory care  unit on discharge  DVT prophylaxis: enoxaparin (LOVENOX) injection 40 mg Start: 12/04/21 1800   Code Status: Full code  Family Communication: I spoke with the patient's son and updated him about the clinical condition..   Status is: Inpatient  Remains inpatient appropriate because: Multifocal pneumonia, thoracic fracture, skilled nursing facility placement, IV antibiotics, metabolic encephalopathy  Consultants: Neurosurgery- verbally consulted from the ED  Procedures: None  Anti-infectives:  Vancomycin and  cefepime 1/3>  Anti-infectives (From admission, onward)    Start     Dose/Rate Route Frequency Ordered Stop   12/05/21 1000  vancomycin (VANCOREADY) IVPB 750 mg/150 mL  Status:  Discontinued        750 mg 150 mL/hr over 60 Minutes Intravenous Every 24 hours 12/04/21 1332 12/04/21 1558   12/05/21 1000  vancomycin (VANCOCIN) IVPB 1000 mg/200 mL premix        1,000 mg 200 mL/hr over 60 Minutes Intravenous Every 24 hours 12/04/21 1558 12/11/21 0959   12/04/21 2200  ceFEPIme (MAXIPIME) 2 g in sodium chloride 0.9 % 100 mL IVPB        2 g 200 mL/hr over 30 Minutes Intravenous Every 12 hours 12/04/21 1332 12/11/21 0959   12/04/21 0845  vancomycin (VANCOCIN) IVPB 1000 mg/200 mL premix        1,000 mg 200 mL/hr over 60 Minutes Intravenous  Once 12/04/21 0833 12/04/21 1307   12/04/21 0845  ceFEPIme (MAXIPIME) 2 g in sodium chloride 0.9 % 100 mL IVPB        2 g 200 mL/hr over 30 Minutes Intravenous  Once 12/04/21 0833 12/04/21 1307      Subjective: Today, patient was seen and examined at bedside. Patient is somnolent at this time. Had received ativan for agitation and pulling lines.   Objective: Vitals:   12/05/21 0600 12/05/21 0640  BP: (!) 92/51 (!) 128/49  Pulse: 73 86  Resp:  18  Temp:  (!) 97.2 F (36.2 C)  SpO2: 93% 97%    Intake/Output Summary (Last 24 hours) at 12/05/2021 0711 Last data filed at 12/04/2021 2255 Gross per 24 hour  Intake 4150 ml  Output 2100  ml  Net 2050 ml   Filed Weights   12/04/21 1547 12/05/21 0640  Weight: 63.5 kg 54.9 kg   Body mass index is 18.68 kg/m.   Physical Exam:  GENERAL: Patient is somnolent mildly responsive on stimulus.  Had received Ativan.  Thinly built HENT: No scleral pallor or icterus. Pupils equally reactive to light. Oral mucosa is dry. NECK: is supple, no gross swelling noted. CHEST: Clear to auscultation.  Diminished breath sounds noted. CVS: S1 and S2 heard, no murmur. Regular rate and rhythm.  ABDOMEN: Soft, non-tender, bowel sounds are present. EXTREMITIES: No edema. CNS: Somnolent, minimally responsive on stimuli, moves extremities. SKIN: warm and dry without rashes.  Data Review: I have personally reviewed the following laboratory data and studies,  CBC: Recent Labs  Lab 11/29/21 1619 12/04/21 0901 12/05/21 0426  WBC 9.9 17.3* 10.1  NEUTROABS 6.7 15.1* 7.5  HGB 10.1* 11.4* 10.9*  HCT 31.6* 36.1 34.3*  MCV 88.3 89.8 89.1  PLT 602* 542* [id number]   Basic Metabolic Panel: Recent Labs  Lab 11/29/21  1619 12/04/21 0901 12/05/21 0426  NA 138 140 137  K 3.6 3.6 3.6  CL 107 110 106  CO2 24 24 27   GLUCOSE 110* 124* 97  BUN 11 9 5*  CREATININE 0.82 0.96 0.82  CALCIUM 7.9* 8.2* 7.9*   Liver Function Tests: Recent Labs  Lab 11/29/21 1619 12/04/21 0901 12/05/21 0426  AST 71* 38 33  ALT 68* 38 32  ALKPHOS 125 106 91  BILITOT 0.7 0.6 0.7  PROT 7.4 7.2 6.5  ALBUMIN 2.8* 3.1* 2.7*   No results for input(s): LIPASE, AMYLASE in the last 168 hours. Recent Labs  Lab 12/04/21 0900  AMMONIA 22   Cardiac Enzymes: No results for input(s): CKTOTAL, CKMB, CKMBINDEX, TROPONINI in the last 168 hours. BNP (last 3 results) Recent Labs    12/04/21 1325  BNP 210.9*    ProBNP (last 3 results) No results for input(s): PROBNP in the last 8760 hours.  CBG: Recent Labs  Lab 12/04/21 0843  GLUCAP 109*   Recent Results (from the past 240 hour(s))  Resp Panel by RT-PCR (Flu A&B,  Covid) Nasopharyngeal Swab     Status: None   Collection Time: 12/04/21  9:55 AM   Specimen: Nasopharyngeal Swab; Nasopharyngeal(NP) swabs in vial transport medium  Result Value Ref Range Status   SARS Coronavirus 2 by RT PCR NEGATIVE NEGATIVE Final    Comment: (NOTE) SARS-CoV-2 target nucleic acids are NOT DETECTED.  The SARS-CoV-2 RNA is generally detectable in upper respiratory specimens during the acute phase of infection. The lowest concentration of SARS-CoV-2 viral copies this assay can detect is 138 copies/mL. A negative result does not preclude SARS-Cov-2 infection and should not be used as the sole basis for treatment or other patient management decisions. A negative result may occur with  improper specimen collection/handling, submission of specimen other than nasopharyngeal swab, presence of viral mutation(s) within the areas targeted by this assay, and inadequate number of viral copies(<138 copies/mL). A negative result must be combined with clinical observations, patient history, and epidemiological information. The expected result is Negative.  Fact Sheet for Patients:  EntrepreneurPulse.com.au  Fact Sheet for Healthcare Providers:  IncredibleEmployment.be  This test is no t yet approved or cleared by the [place] FDA and  has been authorized for detection and/or diagnosis of SARS-CoV-2 by FDA under an Emergency Use Authorization (EUA). This EUA will remain  in effect (meaning this test can be used) for the duration of the COVID-19 declaration under Section 564(b)(1) of the Act, 21 U.S.C.section 360bbb-3(b)(1), unless the authorization is terminated  or revoked sooner.       Influenza A by PCR NEGATIVE NEGATIVE Final   Influenza B by PCR NEGATIVE NEGATIVE Final    Comment: (NOTE) The Xpert Xpress SARS-CoV-2/FLU/RSV plus assay is intended as an aid in the diagnosis of influenza from Nasopharyngeal swab specimens and should  not be used as a sole basis for treatment. Nasal washings and aspirates are unacceptable for Xpert Xpress SARS-CoV-2/FLU/RSV testing.  Fact Sheet for Patients: EntrepreneurPulse.com.au  Fact Sheet for Healthcare Providers: IncredibleEmployment.be  This test is not yet approved or cleared by the [place] FDA and has been authorized for detection and/or diagnosis of SARS-CoV-2 by FDA under an Emergency Use Authorization (EUA). This EUA will remain in effect (meaning this test can be used) for the duration of the COVID-19 declaration under Section 564(b)(1) of the Act, 21 U.S.C. section 360bbb-3(b)(1), unless the authorization is terminated or revoked.  Performed at [facility], [address] [place], [place] [address]   MRSA Next Gen by PCR, Nasal     Status: None   Collection Time: 12/04/21  2:00 PM   Specimen: Nasal Mucosa; Nasal Swab  Result Value Ref Range Status   MRSA by PCR Next Gen NOT DETECTED NOT DETECTED Final    Comment: (NOTE) The GeneXpert MRSA Assay (FDA approved for NASAL specimens only), is one component of a comprehensive MRSA colonization surveillance program. It is not intended to diagnose MRSA infection nor to guide or monitor treatment for MRSA infections. Test performance is not FDA approved in patients less than 64 years old. Performed at [facility], [place] [address] [place], [place] [address]      Studies: CT HEAD WO CONTRAST (5MM)  Result Date: 12/04/2021 CLINICAL DATA:  Head trauma, minor EXAM: CT HEAD WITHOUT CONTRAST TECHNIQUE: Contiguous axial images were obtained from the base of the skull through the vertex without intravenous contrast. COMPARISON:  11/29/2021 FINDINGS: Brain: Today's study suffers from motion degradation. There is brain atrophy as seen previously. No sign of acute infarction, mass lesion, hemorrhage, hydrocephalus or extra-axial collection. Vascular:  There is atherosclerotic calcification of the major vessels at the base of the brain. Skull: Negative, allowing for motion degradation. Sinuses/Orbits: Clear/negative Other: 9 IMPRESSION: Motion degraded study.  No acute or traumatic finding. Electronically Signed   By: Nelson Chimes M.D.   On: 12/04/2021 11:54   CT Angio Chest PE W and/or Wo Contrast  Result Date: 12/04/2021 CLINICAL DATA:  Pulmonary embolism suspected, high probability. Abdominal trauma, blunt. EXAM: CT ANGIOGRAPHY CHEST CT ABDOMEN AND PELVIS WITH CONTRAST TECHNIQUE: Multidetector CT imaging of the chest was performed using the standard protocol during bolus administration of intravenous contrast. Multiplanar CT image reconstructions and MIPs were obtained to evaluate the vascular anatomy. Multidetector CT imaging of the abdomen and pelvis was performed using the standard protocol during bolus administration of intravenous contrast. CONTRAST:  27mL OMNIPAQUE IOHEXOL 350 MG/ML SOLN COMPARISON:  Two-view chest x-ray 11/29/2021 and one-view chest x-ray 12/04/2021 FINDINGS: CTA CHEST FINDINGS Cardiovascular: Heart size is normal. Atherosclerotic calcifications are present at the aortic arch great vessel origins. No stenosis or aneurysm is present. No acute vascular injury is present. Pulmonary arteries demonstrate excellent opacification. No focal filling defects are present to suggest pulmonary emboli. Pulmonary artery size is normal. Mediastinum/Nodes: No enlarged mediastinal, hilar, or axillary lymph nodes. Thyroid gland, trachea, and esophagus demonstrate no significant findings. Lungs/Pleura: Patchy airspace opacities are present in the inferior upper lobes bilaterally. Patchy airspace opacities are present inferiorly in the upper lobes bilaterally and in the right middle lobe. More extensive patchy airspace consolidation is present the right lung base. More dense consolidation is present in the medial and posterior left lower lobe. A small  left pleural effusion is present. The airways are patent. Musculoskeletal: The T6 compression fracture is new since 11/29/2021. Approximately 50% loss of height is present without significant retropulsed bone. Remote superior endplate fractures are present at T9 and T11. No focal osseous lesions are present. No chest wall mass scratched at no chest wall lesions are present. Ribs are within normal limits. Review of the MIP images confirms the above findings. CT ABDOMEN and PELVIS FINDINGS Hepatobiliary: Simple cyst in the right lobe of the liver on image 10 of series 2 measures 11 mm. No acute trauma is present. The common bile duct gallbladder normal. Pancreas: Unremarkable. No pancreatic ductal dilatation or surrounding inflammatory changes. Spleen: Splenic cyst measures 15 mm. Spleen  is otherwise unremarkable. Adrenals/Urinary Tract: Adrenal glands are unremarkable. Kidneys are normal, without renal calculi, focal lesion, or hydronephrosis. Bladder is unremarkable. Stomach/Bowel: The stomach and duodenum are within normal limits. Small bowel is unremarkable. There is some stranding in mesentery. Terminal ileum is within normal limits. Appendix is visualized and within normal limits. The ascending and transverse colon are normal. Descending and sigmoid colon are within normal limits. Vascular/Lymphatic: Atherosclerotic calcifications are present in the aorta branch vessels without aneurysm. No significant adenopathy is present. Reproductive: Uterus and bilateral adnexa are unremarkable. Other: No abdominal wall hernia or abnormality. No abdominopelvic ascites. Musculoskeletal: Grade 1 degenerative anterolisthesis is present at L4-5. Chronic endplate changes are worse right than left associated with curvature of the spine at L5-S1. Bony pelvis is within normal limits. The hips are located and normal. Review of the MIP images confirms the above findings. IMPRESSION: 1. No pulmonary embolus. 2. Extensive bilateral  airspace disease compatible with multi lobar pneumonia. The most prominent area of consolidation is in the left lower lobe. Aspiration considered less likely. 3. Small left pleural effusion. 4. Acute/subacute T6 compression fracture with approximately 50% loss of height. This is new since 11/29/2021. 5. Remote superior endplate fractures at T9 and T11. 6. No acute trauma to the abdomen or pelvis. 7. Benign-appearing cysts of the liver and spleen. 8. Aortic Atherosclerosis (ICD10-I70.0). Electronically Signed   By: San Morelle M.D.   On: 12/04/2021 12:08   CT CERVICAL SPINE WO CONTRAST  Result Date: 12/04/2021 CLINICAL DATA:  Neck trauma EXAM: CT CERVICAL SPINE WITHOUT CONTRAST TECHNIQUE: Multidetector CT imaging of the cervical spine was performed without intravenous contrast. Multiplanar CT image reconstructions were also generated. COMPARISON:  06/19/2021 FINDINGS: Alignment: Minimal curvature convex to the right. Skull base and vertebrae: No evidence of regional fracture or focal bone lesion. Soft tissues and spinal canal: Negative Disc levels: Ordinary spondylosis C4-5 through C6-7. Mild facet osteoarthritis. Bony foraminal encroachment by osteophytes on the right at C5-6 and C6-7 and on the left at C4-5, C5-6 and C6-7. Upper chest: Negative Other: None IMPRESSION: No acute or traumatic finding. Ordinary mid cervical spondylosis as described. Electronically Signed   By: Nelson Chimes M.D.   On: 12/04/2021 11:55   CT ABDOMEN PELVIS W CONTRAST  Result Date: 12/04/2021 CLINICAL DATA:  Pulmonary embolism suspected, high probability. Abdominal trauma, blunt. EXAM: CT ANGIOGRAPHY CHEST CT ABDOMEN AND PELVIS WITH CONTRAST TECHNIQUE: Multidetector CT imaging of the chest was performed using the standard protocol during bolus administration of intravenous contrast. Multiplanar CT image reconstructions and MIPs were obtained to evaluate the vascular anatomy. Multidetector CT imaging of the abdomen and pelvis  was performed using the standard protocol during bolus administration of intravenous contrast. CONTRAST:  64mL OMNIPAQUE IOHEXOL 350 MG/ML SOLN COMPARISON:  Two-view chest x-ray 11/29/2021 and one-view chest x-ray 12/04/2021 FINDINGS: CTA CHEST FINDINGS Cardiovascular: Heart size is normal. Atherosclerotic calcifications are present at the aortic arch great vessel origins. No stenosis or aneurysm is present. No acute vascular injury is present. Pulmonary arteries demonstrate excellent opacification. No focal filling defects are present to suggest pulmonary emboli. Pulmonary artery size is normal. Mediastinum/Nodes: No enlarged mediastinal, hilar, or axillary lymph nodes. Thyroid gland, trachea, and esophagus demonstrate no significant findings. Lungs/Pleura: Patchy airspace opacities are present in the inferior upper lobes bilaterally. Patchy airspace opacities are present inferiorly in the upper lobes bilaterally and in the right middle lobe. More extensive patchy airspace consolidation is present the right lung base. More dense consolidation is present in  the medial and posterior left lower lobe. A small left pleural effusion is present. The airways are patent. Musculoskeletal: The T6 compression fracture is new since 11/29/2021. Approximately 50% loss of height is present without significant retropulsed bone. Remote superior endplate fractures are present at T9 and T11. No focal osseous lesions are present. No chest wall mass scratched at no chest wall lesions are present. Ribs are within normal limits. Review of the MIP images confirms the above findings. CT ABDOMEN and PELVIS FINDINGS Hepatobiliary: Simple cyst in the right lobe of the liver on image 10 of series 2 measures 11 mm. No acute trauma is present. The common bile duct gallbladder normal. Pancreas: Unremarkable. No pancreatic ductal dilatation or surrounding inflammatory changes. Spleen: Splenic cyst measures 15 mm. Spleen is otherwise unremarkable.  Adrenals/Urinary Tract: Adrenal glands are unremarkable. Kidneys are normal, without renal calculi, focal lesion, or hydronephrosis. Bladder is unremarkable. Stomach/Bowel: The stomach and duodenum are within normal limits. Small bowel is unremarkable. There is some stranding in mesentery. Terminal ileum is within normal limits. Appendix is visualized and within normal limits. The ascending and transverse colon are normal. Descending and sigmoid colon are within normal limits. Vascular/Lymphatic: Atherosclerotic calcifications are present in the aorta branch vessels without aneurysm. No significant adenopathy is present. Reproductive: Uterus and bilateral adnexa are unremarkable. Other: No abdominal wall hernia or abnormality. No abdominopelvic ascites. Musculoskeletal: Grade 1 degenerative anterolisthesis is present at L4-5. Chronic endplate changes are worse right than left associated with curvature of the spine at L5-S1. Bony pelvis is within normal limits. The hips are located and normal. Review of the MIP images confirms the above findings. IMPRESSION: 1. No pulmonary embolus. 2. Extensive bilateral airspace disease compatible with multi lobar pneumonia. The most prominent area of consolidation is in the left lower lobe. Aspiration considered less likely. 3. Small left pleural effusion. 4. Acute/subacute T6 compression fracture with approximately 50% loss of height. This is new since 11/29/2021. 5. Remote superior endplate fractures at T9 and T11. 6. No acute trauma to the abdomen or pelvis. 7. Benign-appearing cysts of the liver and spleen. 8. Aortic Atherosclerosis (ICD10-I70.0). Electronically Signed   By: San Morelle M.D.   On: 12/04/2021 12:08   CT T-SPINE NO CHARGE  Result Date: 12/04/2021 CLINICAL DATA:  Fall, altered mental status EXAM: CT LUMBAR SPINE WITHOUT CONTRAST TECHNIQUE: Multidetector CT imaging of the lumbar spine was performed without intravenous contrast administration.  Multiplanar CT image reconstructions were also generated. COMPARISON:  None. FINDINGS: Thoracic spine. Alignment: Advanced kyphosis of the thoracic spine. Vertebrae: Wedge-shaped compression deformity of T6 vertebral body with approximately 50% anterior vertebral body height loss. There is no surrounding edema. No retropulsion into the spinal canal. Paraspinal and other soft tissues: No evidence of hematoma or significant soft tissue abnormality. Left lung opacity concerning for pneumonia. Disc levels: Advanced degenerate disc disease. No significant spinal canal narrowing. Lumbar spine: Segmentation: 5 lumbar type vertebrae. Alignment: Grade 1 anterolisthesis of L4. Vertebrae: No acute fracture or focal pathologic process. Paraspinal and other soft tissues: Negative. Disc levels: Disc osteophyte complex with prominent osteophytes at L5-S1. Moderate spinal canal and right neural foraminal narrowing. Multilevel facet joint arthropathy. IMPRESSION: 1. Wedge-shaped compression deformity of T6 vertebral body with approximately 50% anterior vertebral body height loss without surrounding edema, likely a chronic process. 2. Advanced kyphosis of the thoracic spine. 3. 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 No acute lumbar fracture or significant soft tissue abnormality. Electronically Signed   By:  Imran  Ahmed D.O.   On: 12/04/2021 13:03   CT L-SPINE NO CHARGE  Result Date: 12/04/2021 CLINICAL DATA:  Fall, altered mental status EXAM: CT LUMBAR SPINE WITHOUT CONTRAST TECHNIQUE: Multidetector CT imaging of the lumbar spine was performed without intravenous contrast administration. Multiplanar CT image reconstructions were also generated. COMPARISON:  None. FINDINGS: Thoracic spine. Alignment: Advanced kyphosis of the thoracic spine. Vertebrae: Wedge-shaped compression deformity of T6 vertebral body with approximately 50% anterior vertebral body height loss. There is no surrounding edema.  No retropulsion into the spinal canal. Paraspinal and other soft tissues: No evidence of hematoma or significant soft tissue abnormality. Left lung opacity concerning for pneumonia. Disc levels: Advanced degenerate disc disease. No significant spinal canal narrowing. Lumbar spine: Segmentation: 5 lumbar type vertebrae. Alignment: Grade 1 anterolisthesis of L4. Vertebrae: No acute fracture or focal pathologic process. Paraspinal and other soft tissues: Negative. Disc levels: Disc osteophyte complex with prominent osteophytes at L5-S1. Moderate spinal canal and right neural foraminal narrowing. Multilevel facet joint arthropathy. IMPRESSION: 1. Wedge-shaped compression deformity of T6 vertebral body with approximately 50% anterior vertebral body height loss without surrounding edema, likely a chronic process. 2. Advanced kyphosis of the thoracic spine. 3. 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 No acute lumbar fracture or significant soft tissue abnormality. Electronically Signed   By: Keane Police D.O.   On: 12/04/2021 13:03   DG Chest Port 1 View  Result Date: 12/04/2021 CLINICAL DATA:  Altered mental status. EXAM: PORTABLE CHEST 1 VIEW COMPARISON:  08/30/2021 FINDINGS: 0912 hours. Lungs are hyperexpanded. Patchy airspace disease again noted left base compatible with pneumonia. No pulmonary edema or substantial pleural effusion. Bones are diffusely demineralized. Telemetry leads overlie the chest. IMPRESSION: Persistent patchy airspace disease left base compatible with pneumonia. Electronically Signed   By: Misty Stanley M.D.   On: 12/04/2021 09:22      Flora Lipps, MD  [facility] 12/05/2021  If 7PM-7AM, please contact night-coverage

## 2021-12-06 DIAGNOSIS — J189 Pneumonia, unspecified organism: Secondary | ICD-10-CM | POA: Diagnosis not present

## 2021-12-06 DIAGNOSIS — A419 Sepsis, unspecified organism: Secondary | ICD-10-CM | POA: Diagnosis not present

## 2021-12-06 DIAGNOSIS — I248 Other forms of acute ischemic heart disease: Secondary | ICD-10-CM | POA: Diagnosis not present

## 2021-12-06 DIAGNOSIS — J181 Lobar pneumonia, unspecified organism: Secondary | ICD-10-CM

## 2021-12-06 DIAGNOSIS — G9341 Metabolic encephalopathy: Secondary | ICD-10-CM

## 2021-12-06 DIAGNOSIS — F03C4 Unspecified dementia, severe, with anxiety: Secondary | ICD-10-CM

## 2021-12-06 DIAGNOSIS — R1310 Dysphagia, unspecified: Secondary | ICD-10-CM

## 2021-12-06 DIAGNOSIS — I700 Atherosclerosis of aorta: Secondary | ICD-10-CM

## 2021-12-06 DIAGNOSIS — L89151 Pressure ulcer of sacral region, stage 1: Secondary | ICD-10-CM | POA: Diagnosis present

## 2021-12-06 LAB — CBC
HCT: 36.2 % (ref 36.0–46.0)
Hemoglobin: 11.1 g/dL — ABNORMAL LOW (ref 12.0–15.0)
MCH: 27.6 pg (ref 26.0–34.0)
MCHC: 30.7 g/dL (ref 30.0–36.0)
MCV: 90 fL (ref 80.0–100.0)
Platelets: 436 10*3/uL — ABNORMAL HIGH (ref 150–400)
RBC: 4.02 MIL/uL (ref 3.87–5.11)
RDW: 14.3 % (ref 11.5–15.5)
WBC: 11.7 10*3/uL — ABNORMAL HIGH (ref 4.0–10.5)
nRBC: 0 % (ref 0.0–0.2)

## 2021-12-06 LAB — BASIC METABOLIC PANEL
Anion gap: 9 (ref 5–15)
BUN: 6 mg/dL — ABNORMAL LOW (ref 8–23)
CO2: 27 mmol/L (ref 22–32)
Calcium: 8.6 mg/dL — ABNORMAL LOW (ref 8.9–10.3)
Chloride: 107 mmol/L (ref 98–111)
Creatinine, Ser: 0.71 mg/dL (ref 0.44–1.00)
GFR, Estimated: >60 mL/min (ref >60)
Glucose, Bld: 98 mg/dL (ref 70–99)
Potassium: 4.5 mmol/L (ref 3.5–5.1)
Sodium: 143 mmol/L (ref 135–145)

## 2021-12-06 LAB — URINE CULTURE: Culture: NO GROWTH

## 2021-12-06 LAB — PHOSPHORUS: Phosphorus: 3.2 mg/dL (ref 2.5–4.6)

## 2021-12-06 LAB — MAGNESIUM: Magnesium: 2.1 mg/dL (ref 1.7–2.4)

## 2021-12-06 NOTE — Assessment & Plan Note (Addendum)
--   Recommend follow-up with facility dietician

## 2021-12-06 NOTE — Hospital Course (Addendum)
71 year old woman from memory care unit PMH dementia with wandering behavior, recent outpatient treatment with Levaquin for pneumonia presented with acute metabolic encephalopathy, pneumonia and sepsis.  Admitted for same.  Sepsis resolved, respiratory status stable, hospitalization complicated by dementia, agitation and confusion.  Now stable for transfer to facility.

## 2021-12-06 NOTE — Assessment & Plan Note (Addendum)
--   With behavioral disturbance, wandering baseline, some agitation here requiring restraints and medication at times.   -- Calm today.

## 2021-12-06 NOTE — Assessment & Plan Note (Signed)
--  no treatment indicated °

## 2021-12-06 NOTE — Assessment & Plan Note (Addendum)
--   Secondary to sepsis, pneumonia complicated by underlying dementia with wandering behavior.  Complicating treatment requiring restraints or Ativan at times.   --overall better, stable for discharge, expect will do best in "home" environment

## 2021-12-06 NOTE — Evaluation (Addendum)
Clinical/Bedside Swallow Evaluation Patient Details  Name: Nancy [name] MRN: [id number] Date of Birth: September 23, 1951  Today's Date: 12/06/2021 Time: SLP Start Time (ACUTE ONLY): 1027 SLP Stop Time (ACUTE ONLY): 1038 SLP Time Calculation (min) (ACUTE ONLY): 11 min  Past Medical History:  Past Medical History:  Diagnosis Date   Dementia (HCC)    Hyperlipidemia    Hypertension    Past Surgical History: History reviewed. No pertinent surgical history. HPI:  Nancy [name] is a 71 y.o. female with medical history significant of dementia, hyperlipidemia, hypertension who was sent from her facility due to cough for the past 3 weeks that seems to be getting a lot worse.  She was given azithromycin without significant results. Has had trouble swallowing pills.    Assessment / Plan / Recommendation  Clinical Impression  Pt demonstrates poor awareness of PO given severely altered mental status. Pt is restless, poorly attentive, will not stay positioned upright. With total assisted feeding with verbal and tactile cues SLP and RN were able to offer pt a few sips of water and bites of puree with meds crushed. Pt barely opened mouth for both and refused further attempts. Though she showed no signs of aspiration, she is not capable of consuming meals at this time. Will order puree and thin liquids which can be attempted if pt is alert and aware. However, she may also benefit from short term means of alternate nutrition until mentation improves to the point where intake is adequate. Will f/u for tolerance and advancement as appropriate. SLP Visit Diagnosis: Dysphagia, oropharyngeal phase (R13.12)    Aspiration Risk  Mild aspiration risk    Diet Recommendation Dysphagia 1 (Puree);Thin liquid   Liquid Administration via: Cup;Straw Medication Administration: Crushed with puree Compensations: Slow rate;Small sips/bites Postural Changes: Seated upright at 90 degrees    Other  Recommendations Oral Care  Recommendations: Oral care BID    Recommendations for follow up therapy are one component of a multi-disciplinary discharge planning process, led by the attending physician.  Recommendations may be updated based on patient status, additional functional criteria and insurance authorization.  Follow up Recommendations Skilled nursing-short term rehab (<3 hours/day)      Assistance Recommended at Discharge    Functional Status Assessment Patient has had a recent decline in their functional status and demonstrates the ability to make significant improvements in function in a reasonable and predictable amount of time.  Frequency and Duration min 2x/week  2 weeks       Prognosis Prognosis for Safe Diet Advancement: Fair Barriers to Reach Goals: Cognitive deficits      Swallow Study   General HPI: Nancy [name] is a 71 y.o. female with medical history significant of dementia, hyperlipidemia, hypertension who was sent from her facility due to cough for the past 3 weeks that seems to be getting a lot worse.  She was given azithromycin without significant results. Has had trouble swallowing pills. Type of Study: Bedside Swallow Evaluation Previous Swallow Assessment: non Diet Prior to this Study: NPO Temperature Spikes Noted: No Respiratory Status: Room air History of Recent Intubation: No Behavior/Cognition: Distractible;Doesn't follow directions;Confused Oral Cavity Assessment: Dry Oral Care Completed by SLP: No Oral Cavity - Dentition: Adequate natural dentition Vision: Impaired for self-feeding Self-Feeding Abilities: Total assist Patient Positioning: Postural control interferes with function Baseline Vocal Quality: Normal Volitional Cough: Cognitively unable to elicit Volitional Swallow: Unable to elicit    Oral/Motor/Sensory Function Overall Oral Motor/Sensory Function: Within functional limits   Ice Chips Ice chips: Impaired  Presentation: Spoon Oral Phase Impairments: Poor  awareness of bolus   Thin Liquid Thin Liquid: Impaired Presentation: Straw Oral Phase Impairments: Poor awareness of bolus    Nectar Thick Nectar Thick Liquid: Not tested   Honey Thick Honey Thick Liquid: Not tested   Puree Puree: Impaired Presentation: Spoon Oral Phase Impairments: Poor awareness of bolus   Solid     Solid: Not tested      Nancy [name], Nancy [name] 12/06/2021,10:43 AM

## 2021-12-06 NOTE — Assessment & Plan Note (Signed)
--   Elevated troponins noted, demand ischemia secondary to sepsis and pneumonia, 2D echocardiogram reassuring with normal LVEF, normal RV systolic function, mitral and aortic valves unremarkable in appearance.  No further evaluation suggested.

## 2021-12-06 NOTE — Assessment & Plan Note (Addendum)
--   Off oxygen.  Appears resolved.  Continue oral antibiotic trhough 1/10.

## 2021-12-06 NOTE — Progress Notes (Signed)
°  Progress Note Patient: Nancy [name] [id number] DOB: Aug 30, 1951 DOA: 12/04/2021     2 DOS: the patient was seen and examined on 12/06/2021   Brief hospital course: 71 year old woman from memory care unit PMH dementia with wandering behavior, recent outpatient treatment with Levaquin for pneumonia presented with acute metabolic encephalopathy, pneumonia and sepsis.  Admitted for same.  Sepsis resolving, respiratory status improving, hospitalization complicated by dementia, agitation and confusion.  Assessment and Plan * Sepsis due to pneumonia [facility])- (present on admission) -- Improving, wean oxygen, continue antibiotics.  Lobar pneumonia ([place]) -- Multilobar pneumonia resulting in sepsis.  Seems to be improving, weaning off oxygen now, respiratory status appears stable.  Continue antibiotics.  Acute metabolic encephalopathy -- Secondary to sepsis, pneumonia complicated by underlying dementia with wandering behavior.  Complicating treatment requiring restraints or Ativan at times.  Supportive care, treat underlying illnesses.  Dysphagia --diet per ST dysphagia 1, thin liquid  Dementia ([place])- (present on admission) -- With neuro disturbance, wandering baseline, some agitation here requiring Ativan.  He has required restraints apparently.  Currently sleeping. -- Supportive care.  Aortic atherosclerosis (HCC) --no treatment indicated  Stage I pressure ulcer of sacral region- (present on admission) --wound care  Malnutrition of moderate degree -- Pursue recommendations per dietitian.  Thoracic compression fracture [facility])- (present on admission) -- Likely chronic process based on radiology commentary.  Previous physician discussed with neurosurgery, conservative management recommended.  Demand ischemia (HCC) -- Elevated troponins noted, demand ischemia secondary to sepsis and pneumonia, 2D echocardiogram reassuring with normal LVEF, normal RV systolic function, mitral and aortic valves  unremarkable in appearance.  No further evaluation suggested.     Subjective:  Patient somnolent, no history offered.  Per RN had a difficult night at times agitated and required medication.  Resting comfortably this morning.  Did not eat breakfast.  Weaning off oxygen now, hemodynamics stable.  Objective Vital signs were reviewed and unremarkable. Physical Exam Constitutional:      General: She is not in acute distress.    Appearance: She is ill-appearing. She is not toxic-appearing.  Cardiovascular:     Rate and Rhythm: Normal rate and regular rhythm.     Heart sounds: No murmur heard.    Comments: Telemetry SR Pulmonary:     Effort: Pulmonary effort is normal. No respiratory distress.     Breath sounds: No wheezing, rhonchi or rales.  Abdominal:     General: Abdomen is flat.     Palpations: Abdomen is soft.  Musculoskeletal:     Right lower leg: No edema.     Left lower leg: No edema.  Neurological:     Mental Status: She is alert.     Comments: Cannot assess, currently sleeping   Data Reviewed:  BMP unremarkable, CBC unremarkable with stable hemoglobin 11.1, WBC modestly elevated 11.7  Family Communication: will contact family later today  Disposition: Status is: Inpatient  Remains inpatient appropriate because: Ongoing treatment for sepsis, pneumonia with IV antibiotics, currently somnolent secondary to dementia and acute metabolic encephalopathy.         Time spent: 35 minutes  Author: Murray Hodgkins, MD 12/06/2021 1:07 PM  For on call review [email or web address].

## 2021-12-06 NOTE — TOC Progression Note (Signed)
Transition of Care [facility]) - Progression Note    Patient Details  Name: Nancy [name] MRN: [id number] Date of Birth: 1951/07/09  Transition of Care [facility]) CM/SW Contact  Purcell Mouton, RN Phone Number: 12/06/2021, 2:31 PM  Clinical Narrative:    Pt is from [facility].TOC will continue to follow.   Expected Discharge Plan: Memory Care [facility]) Barriers to Discharge: No Barriers Identified  Expected Discharge Plan and Services Expected Discharge Plan: Memory Care [facility])       Living arrangements for the past 2 months: [place] (Memory Care)                                       Social Determinants of Health (SDOH) Interventions    Readmission Risk Interventions No flowsheet data found.

## 2021-12-06 NOTE — Assessment & Plan Note (Signed)
--   Likely chronic process based on radiology commentary.  Previous physician discussed with neurosurgery, conservative management recommended.

## 2021-12-06 NOTE — Progress Notes (Signed)
This nurse was providing routine care to the pt this AM when one of the NT's entered the room to inquire whether the pt needed anything. As the NT exited the room, the pt stated, "You should kill her." The pt was informed that this is not behavior that would be tolerated and the pt stated, "I will kill her then." This nurse will inform the oncoming nurse of this behavior for continued follow up care/evaluation.

## 2021-12-06 NOTE — Assessment & Plan Note (Addendum)
--   Multilobar pneumonia resulting in sepsis.  Appears resolved, off oxygen, respiratory status stable.  Changed to oral antibiotics.

## 2021-12-06 NOTE — Assessment & Plan Note (Addendum)
--  diet per ST dysphagia 1, thin liquid, recommend outpatient speech therapy

## 2021-12-06 NOTE — Assessment & Plan Note (Signed)
wound care.

## 2021-12-07 DIAGNOSIS — A419 Sepsis, unspecified organism: Secondary | ICD-10-CM | POA: Diagnosis not present

## 2021-12-07 DIAGNOSIS — J189 Pneumonia, unspecified organism: Secondary | ICD-10-CM | POA: Diagnosis not present

## 2021-12-07 DIAGNOSIS — G9341 Metabolic encephalopathy: Secondary | ICD-10-CM | POA: Diagnosis not present

## 2021-12-07 DIAGNOSIS — F03C4 Unspecified dementia, severe, with anxiety: Secondary | ICD-10-CM | POA: Diagnosis not present

## 2021-12-07 NOTE — TOC Progression Note (Addendum)
Transition of Care [facility]) - Progression Note    Patient Details  Name: Nancy [name] MRN: [id number] Date of Birth: 10-22-1951  Transition of Care [facility]) CM/SW Contact  Laycee Fitzsimmons, Olegario Messier, RN Phone Number: 12/07/2021, 12:52 PM  Clinical Narrative: Sherron Monday to Britta Mccreedy rep @  [facility] memory care-can accept patient on Monday(no weekend admissions or returns) MD notified. Signed fl2 faxed w/confirmation [phone]. -[name] already active w/HHPT/OT/ST.    Expected Discharge Plan: Memory Care [facility]) Barriers to Discharge: Continued Medical Work up  Expected Discharge Plan and Services Expected Discharge Plan: Memory Care [facility])       Living arrangements for the past 2 months: Assisted Living Facility (Memory Care)                                       Social Determinants of Health (SDOH) Interventions    Readmission Risk Interventions No flowsheet data found.

## 2021-12-07 NOTE — Care Management Important Message (Signed)
Important Message  Patient Details IM Letter placed in room for son Willo Yoon Name: Nancy [name] MRN: [id number] Date of Birth: February 24, 1951   Medicare Important Message Given:  Yes     Caren Macadam 12/07/2021, 10:53 AM

## 2021-12-07 NOTE — Progress Notes (Signed)
°  Progress Note   Patient: Nancy [name] [id number] DOB: 12/14/1950 DOA: 12/04/2021     3 DOS: the patient was seen and examined on 12/07/2021   Brief hospital course: 71 year old woman from memory care unit PMH dementia with wandering behavior, recent outpatient treatment with Levaquin for pneumonia presented with acute metabolic encephalopathy, pneumonia and sepsis.  Admitted for same.  Sepsis resolving, respiratory status improving, hospitalization complicated by dementia, agitation and confusion.  Assessment and Plan * Sepsis due to pneumonia [facility])- (present on admission) -- Improving, oxygen weaned off, changed to oral antibiotics tomorrow.  Lobar pneumonia (HCC) -- Multilobar pneumonia resulting in sepsis.  Continues to improve, appears to be weaned off oxygen, respiratory status appears stable.  Can change to oral antibiotics tomorrow  Acute metabolic encephalopathy -- Secondary to sepsis, pneumonia complicated by underlying dementia with wandering behavior.  Complicating treatment requiring restraints or Ativan at times.  However no need for restraints as of late or sitter.  Dysphagia --diet per ST dysphagia 1, thin liquid  Dementia (HCC)- (present on admission) -- With neuro disturbance, wandering baseline, some agitation here requiring Ativan.   -- Supportive care.  Aortic atherosclerosis (HCC) --no treatment indicated  Stage I pressure ulcer of sacral region- (present on admission) --wound care  Malnutrition of moderate degree -- Pursue recommendations per dietitian.  Thoracic compression fracture [facility])- (present on admission) -- Likely chronic process based on radiology commentary.  Previous physician discussed with neurosurgery, conservative management recommended.  Demand ischemia (HCC) -- Elevated troponins noted, demand ischemia secondary to sepsis and pneumonia, 2D echocardiogram reassuring with normal LVEF, normal RV systolic function, mitral and aortic valves  unremarkable in appearance.  No further evaluation suggested.     Subjective:  Feels fine  Objective Vital signs were reviewed and unremarkable. Physical Exam Vitals reviewed.  Constitutional:      General: She is not in acute distress.    Appearance: She is not ill-appearing or toxic-appearing.  Cardiovascular:     Rate and Rhythm: Normal rate and regular rhythm.     Heart sounds: No murmur heard. Pulmonary:     Effort: Pulmonary effort is normal. No respiratory distress.     Breath sounds: No wheezing, rhonchi or rales.  Neurological:     Mental Status: She is alert.  Psychiatric:        Mood and Affect: Mood normal.        Behavior: Behavior normal.     Data Reviewed:  There are no new results to review at this time.  Family Communication: none  Disposition: Status is: Inpatient  Remains inpatient appropriate because: ongoing treatment         Time spent: 25 minutes  Author: Brendia Sacks, MD 12/07/2021 9:39 PM  For on call review [email or web address].

## 2021-12-07 NOTE — NC FL2 (Signed)
[place] LEVEL OF CARE SCREENING TOOL     IDENTIFICATION  Patient Name: Nancy [name] Birthdate: 02-04-1951 Sex: female Admission Date (Current Location): 12/04/2021  [facility] and [place] Number:  Herbalist and Address:  [facility],  [place], [place]      Provider Number: [id number]  Attending Physician Name and Address:  Samuella Cota, MD  Relative Name and Phone Number:  Marciela Arntz son [phone]    Current Level of Care: Hospital Recommended Level of Care: Memory Care Prior Approval Number:    Date Approved/Denied:   PASRR Number:    Discharge Plan: Other (Comment) [facility]memory care)    Current Diagnoses: Patient Active Problem List   Diagnosis Date Noted   Dysphagia 12/06/2021   Stage I pressure ulcer of sacral region 12/06/2021   Lobar pneumonia ([place]) 99991111   Acute metabolic encephalopathy 99991111   Aortic atherosclerosis ([place]) 12/06/2021   Pressure injury of skin 12/05/2021   Malnutrition of moderate degree 12/05/2021   Sepsis due to pneumonia ([place]) 12/04/2021   Demand ischemia ([place]) 12/04/2021   Thoracic compression fracture ([place]) 12/04/2021   Hyperlipidemia    Hypertension    Dementia (HCC)    Thrombocytosis    Normocytic anemia    Mild protein malnutrition (HCC)     Orientation RESPIRATION BLADDER Height & Weight     Self  Normal Incontinent Weight: 55.2 kg Height:     BEHAVIORAL SYMPTOMS/MOOD NEUROLOGICAL BOWEL NUTRITION STATUS      Incontinent Diet (Dysphagia 1)  AMBULATORY STATUS COMMUNICATION OF NEEDS Skin   Limited Assist Verbally Normal                       Personal Care Assistance Level of Assistance  Bathing, Feeding, Dressing Bathing Assistance: Limited assistance Feeding assistance: Limited assistance Dressing Assistance: Limited assistance     Functional Limitations Info  Sight, Hearing, Speech Sight Info: Adequate Hearing Info:  Adequate Speech Info: Adequate    SPECIAL CARE FACTORS FREQUENCY                       Contractures Contractures Info: Not present    Additional Factors Info  Code Status, Allergies, Psychotropic Code Status Info:  (Full) Allergies Info:  (Cefdinir) Psychotropic Info:  (zyprexa;ativan,celexa see MAR)         Current Medications (12/07/2021):  This is the current hospital active medication list Current Facility-Administered Medications  Medication Dose Route Frequency Provider Last Rate Last Admin   acetaminophen (TYLENOL) tablet 650 mg  650 mg Oral Q6H PRN Reubin Milan, MD       Or   acetaminophen (TYLENOL) suppository 650 mg  650 mg Rectal Q6H PRN Reubin Milan, MD       alum & mag hydroxide-simeth (MAALOX/MYLANTA) 200-200-20 MG/5ML suspension 30 mL  30 mL Oral Q6H PRN Reubin Milan, MD       ceFEPIme (MAXIPIME) 2 g in sodium chloride 0.9 % 100 mL IVPB  2 g Intravenous Q12H Suzzanne Cloud, RPH 200 mL/hr at 12/06/21 2137 2 g at 12/06/21 2137   citalopram (CELEXA) tablet 10 mg  10 mg Oral Daily Reubin Milan, MD   10 mg at 12/06/21 1026   divalproex (DEPAKOTE) DR tablet 125 mg  125 mg Oral Daily Reubin Milan, MD   125 mg at 12/06/21 1026   enoxaparin (LOVENOX) injection 40 mg  40 mg  Subcutaneous Q24H Reubin Milan, MD   40 mg at 12/06/21 1724   guaiFENesin (ROBITUSSIN) 100 MG/5ML liquid 200 mg  200 mg Oral QID PRN Reubin Milan, MD       LORazepam (ATIVAN) injection 0.5 mg  0.5 mg Intravenous Q6H PRN Pokhrel, Laxman, MD   0.5 mg at 12/06/21 0109   magnesium hydroxide (MILK OF MAGNESIA) suspension 30 mL  30 mL Oral QHS PRN Reubin Milan, MD       metoprolol tartrate (LOPRESSOR) injection 5 mg  5 mg Intravenous Q8H Reubin Milan, MD   5 mg at 12/07/21 0535   neomycin-bacitracin-polymyxin (NEOSPORIN) ointment packet 1 application  1 application Topical Daily PRN Reubin Milan, MD       OLANZapine [facility]) tablet 10 mg   10 mg Oral Daily Reubin Milan, MD   10 mg at 12/06/21 1026   ondansetron [facility]) tablet 4 mg  4 mg Oral Q6H PRN Reubin Milan, MD       Or   ondansetron [facility]) injection 4 mg  4 mg Intravenous Q6H PRN Reubin Milan, MD       traZODone (DESYREL) tablet 50 mg  50 mg Oral QHS Reubin Milan, MD         Discharge Medications: Please see discharge summary for a list of discharge medications.  Relevant Imaging Results:  Relevant Lab Results:   Additional Information SS# [id number]  Dessa Phi, RN

## 2021-12-07 NOTE — Progress Notes (Signed)
Pharmacy Antibiotic Note  Nancy [name] is a 71 y.o. female admitted on 12/04/2021 with sepsis due to pneumonia.  Pharmacy has been consulted for cefepime dosing with a duration of 7 days.  Plan: Continue cefepime 2g IV q 12 hours for 7 days Stop date added for 1/9  Dose adjustment for cefepime unlikely at this time given stable renal function. Pharmacy will formally sign off of consult and will continue to monitor peripherally.   Weight: 55.2 kg (121 lb 11.1 oz)  Temp (24hrs), Avg:98.3 F (36.8 C), Min:97.5 F (36.4 C), Max:98.7 F (37.1 C)  Recent Labs  Lab 12/04/21 0901 12/04/21 1224 12/05/21 0426 12/06/21 0242  WBC 17.3*  --  10.1 11.7*  CREATININE 0.96  --  0.82 0.71  LATICACIDVEN 1.7 1.1  --   --      Estimated Creatinine Clearance: 55.4 mL/min (by C-G formula based on SCr of 0.71 mg/dL).  Last recorded weight 54.4 kg (03/24/2020) 12/04/2021 est crcl using Scr 0.96 and wt 54.4 kg = 45 ml/min   Allergies  Allergen Reactions   Cefdinir Diarrhea    Patient states this medication makes her feel weird as well as having diarrhea    Antimicrobials this admission: 1/3 cefepime >> (1/9) 1/3 vancomycin >> 1/5  Dose adjustments this admission:   Microbiology results: 1/3 BCx: ngtd 1/3 UCx: ngtd 1/3 MRSA PCR: not detected  Thank you for allowing pharmacy to be a part of this patients care.  Rexford Maus, PharmD 12/07/2021 9:10 AM

## 2021-12-08 ENCOUNTER — Encounter (HOSPITAL_COMMUNITY): Payer: Self-pay | Admitting: Internal Medicine

## 2021-12-08 DIAGNOSIS — G9341 Metabolic encephalopathy: Secondary | ICD-10-CM | POA: Diagnosis not present

## 2021-12-08 DIAGNOSIS — J189 Pneumonia, unspecified organism: Secondary | ICD-10-CM | POA: Diagnosis not present

## 2021-12-08 DIAGNOSIS — J181 Lobar pneumonia, unspecified organism: Secondary | ICD-10-CM

## 2021-12-08 DIAGNOSIS — A419 Sepsis, unspecified organism: Secondary | ICD-10-CM | POA: Diagnosis not present

## 2021-12-08 MED ORDER — AMOXICILLIN-POT CLAVULANATE 875-125 MG PO TABS
1.0000 | ORAL_TABLET | Freq: Two times a day (BID) | ORAL | Status: DC
  Administered 2021-12-08 – 2021-12-10 (×4): 1 via ORAL
  Filled 2021-12-08 (×4): qty 1

## 2021-12-08 MED ORDER — POLYETHYLENE GLYCOL 3350 17 G PO PACK
17.0000 g | PACK | Freq: Two times a day (BID) | ORAL | Status: DC
  Administered 2021-12-08 – 2021-12-10 (×4): 17 g via ORAL
  Filled 2021-12-08 (×4): qty 1

## 2021-12-08 MED ORDER — SENNA 8.6 MG PO TABS
1.0000 | ORAL_TABLET | Freq: Every day | ORAL | Status: DC
  Administered 2021-12-08 – 2021-12-09 (×2): 8.6 mg via ORAL
  Filled 2021-12-08 (×2): qty 1

## 2021-12-08 NOTE — Progress Notes (Signed)
Pt this AM very restless with multiple attempts to get of the bed. Pt confused and unable to reorient Pt. MD updated and new orders received for Posey belt which was applied by 2 RNs. Maintain all safety measures for high risk Patients Fall matts in place and bed alarm on.

## 2021-12-08 NOTE — Progress Notes (Signed)
Progress Note   Patient: Nancy [name] EYC:[id number] DOB: 12-Jan-1951 DOA: 12/04/2021     4 DOS: the patient was seen and examined on 12/08/2021   Brief hospital course: 71 year old woman from memory care unit PMH dementia with wandering behavior, recent outpatient treatment with Levaquin for pneumonia presented with acute metabolic encephalopathy, pneumonia and sepsis.  Admitted for same.  Sepsis resolving, respiratory status improving, hospitalization complicated by dementia, agitation and confusion.  Assessment and Plan * Sepsis due to pneumonia [facility])- (present on admission) -- Improving, oxygen weaned off, changed to oral antibiotics tomorrow.   Lobar pneumonia (HCC) -- Multilobar pneumonia resulting in sepsis.  Continues to improve, appears to be weaned off oxygen, respiratory status appears stable.  Can change to oral antibiotics tomorrow   Acute metabolic encephalopathy -- Secondary to sepsis, pneumonia complicated by underlying dementia with wandering behavior.  Complicating treatment requiring restraints or Ativan at times.  However no need for restraints as of late or sitter.   Dysphagia --diet per ST dysphagia 1, thin liquid   Dementia (HCC)- (present on admission) -- With neuro disturbance, wandering baseline, some agitation here requiring Ativan.   -- Supportive care.   Aortic atherosclerosis (HCC) --no treatment indicated   Stage I pressure ulcer of sacral region- (present on admission) --wound care   Malnutrition of moderate degree -- Pursue recommendations per dietitian.   Thoracic compression fracture [facility])- (present on admission) -- Likely chronic process based on radiology commentary.  Previous physician discussed with neurosurgery, conservative management recommended.   Demand ischemia (HCC) -- Elevated troponins noted, demand ischemia secondary to sepsis and pneumonia, 2D echocardiogram reassuring with normal LVEF, normal RV systolic function, mitral and  aortic valves unremarkable in appearance.  No further evaluation suggested.  Assessment and Plan * Sepsis due to pneumonia [facility])- (present on admission) -- Improving, oxygen weaned off, appears resolved.  Lobar pneumonia (HCC) -- Multilobar pneumonia resulting in sepsis.  Appears resolved at this point, off oxygen respiratory status stable.  Changed to oral antibiotics.  Acute metabolic encephalopathy -- Secondary to sepsis, pneumonia complicated by underlying dementia with wandering behavior.  Complicating treatment requiring restraints or Ativan at times.  Now more confused again.  Bowels moving, voiding.  Check urinalysis.  Restraints if needed to prevent self-harm and fall.  Delirium precautions.  Bladder scan.  Dysphagia --diet per ST dysphagia 1, thin liquid  Dementia (HCC)- (present on admission) -- With behavioral disturbance, wandering baseline, some agitation here requiring Ativan.   -- Supportive care.  Aortic atherosclerosis (HCC) --no treatment indicated  Stage I pressure ulcer of sacral region- (present on admission) --wound care  Malnutrition of moderate degree -- Pursue recommendations per dietitian.  Thoracic compression fracture [facility])- (present on admission) -- Likely chronic process based on radiology commentary.  Previous physician discussed with neurosurgery, conservative management recommended.  Demand ischemia (HCC) -- Elevated troponins noted, demand ischemia secondary to sepsis and pneumonia, 2D echocardiogram reassuring with normal LVEF, normal RV systolic function, mitral and aortic valves unremarkable in appearance.  No further evaluation suggested.     Subjective:  Very confused per RN today, high fall risk, attempting to get out of bed.  Patient confused, can offer no history. RN reports urinary incontinence large volume also putting out empiric.  Bowels moved recently per RN though I don't see charted.  Objective Vital signs were reviewed and  unremarkable. Physical Exam Constitutional:      General: She is not in acute distress.    Appearance: She is not ill-appearing or toxic-appearing.  Cardiovascular:     Rate and Rhythm: Normal rate and regular rhythm.     Heart sounds: No murmur heard. Pulmonary:     Effort: Pulmonary effort is normal. No respiratory distress.     Breath sounds: No wheezing, rhonchi or rales.  Neurological:     Mental Status: She is alert.  Psychiatric:     Comments: Quite confused, mildly anxious, restless, moving about in bed.  Unable to offer history.     Data Reviewed:  No new data  Family Communication: none prsent  Disposition: Status is: Inpatient  Remains inpatient appropriate because: awaiting SNF, confused today         Time spent: 35 minutes  Author: Brendia Sacks, MD 12/08/2021 5:06 PM  For on call review [email or web address].

## 2021-12-09 DIAGNOSIS — A419 Sepsis, unspecified organism: Secondary | ICD-10-CM | POA: Diagnosis not present

## 2021-12-09 DIAGNOSIS — F03C4 Unspecified dementia, severe, with anxiety: Secondary | ICD-10-CM | POA: Diagnosis not present

## 2021-12-09 DIAGNOSIS — J189 Pneumonia, unspecified organism: Secondary | ICD-10-CM | POA: Diagnosis not present

## 2021-12-09 DIAGNOSIS — J181 Lobar pneumonia, unspecified organism: Secondary | ICD-10-CM | POA: Diagnosis not present

## 2021-12-09 LAB — URINALYSIS, COMPLETE (UACMP) WITH MICROSCOPIC
Bilirubin Urine: NEGATIVE
Glucose, UA: NEGATIVE mg/dL
Ketones, ur: 20 mg/dL — AB
Leukocytes,Ua: NEGATIVE
Nitrite: NEGATIVE
Protein, ur: 30 mg/dL — AB
Specific Gravity, Urine: 1.016 (ref 1.005–1.030)
pH: 7 (ref 5.0–8.0)

## 2021-12-09 LAB — CULTURE, BLOOD (ROUTINE X 2)
Culture: NO GROWTH
Special Requests: ADEQUATE

## 2021-12-09 MED ORDER — CITALOPRAM HYDROBROMIDE 20 MG PO TABS
10.0000 mg | ORAL_TABLET | Freq: Every day | ORAL | Status: DC
  Administered 2021-12-10: 10 mg via ORAL
  Filled 2021-12-09: qty 1

## 2021-12-09 NOTE — Plan of Care (Signed)
°  Problem: Activity: Goal: Ability to tolerate increased activity will improve Outcome: Not Progressing   Problem: Health Behavior/Discharge Planning: Goal: Ability to manage health-related needs will improve Outcome: Not Progressing   Problem: Activity: Goal: Risk for activity intolerance will decrease Outcome: Not Progressing   Problem: Nutrition: Goal: Adequate nutrition will be maintained Outcome: Not Progressing

## 2021-12-09 NOTE — Progress Notes (Addendum)
°  Progress Note   Patient: Nancy [name] DXA:[id number] DOB: 1951-06-11 DOA: 12/04/2021     5 DOS: the patient was seen and examined on 12/09/2021   Brief hospital course: 71 year old woman from memory care unit PMH dementia with wandering behavior, recent outpatient treatment with Levaquin for pneumonia presented with acute metabolic encephalopathy, pneumonia and sepsis.  Admitted for same.  Sepsis resolved, respiratory status stable, hospitalization complicated by dementia, agitation and confusion.  Now stable for transfer to facility.  Assessment and Plan * Sepsis due to pneumonia [facility])- (present on admission) -- Off oxygen.  Appears resolved.  Continue oral antibiotic.  Acute metabolic encephalopathy -- Secondary to sepsis, pneumonia complicated by underlying dementia with wandering behavior.  Complicating treatment requiring restraints or Ativan at times.  Intermittently confused, more comfortable today.  Overall stable.  Expect gradual return to baseline at home facility.  Lobar pneumonia (HCC) -- Multilobar pneumonia resulting in sepsis.  Appears resolved, off oxygen, respiratory status stable.  Changed to oral antibiotics.  Dementia (HCC)- (present on admission) -- With behavioral disturbance, wandering baseline, some agitation here requiring restraints and medication at times.   -- Better today.  Malnutrition of moderate degree -- Pursue recommendations per dietitian.  Stage I pressure ulcer of sacral region- (present on admission) --wound care  Dysphagia --diet per ST dysphagia 1, thin liquid  Thoracic compression fracture (HCC)- (present on admission) -- Likely chronic process based on radiology commentary.  Previous physician discussed with neurosurgery, conservative management recommended.  Aortic atherosclerosis (HCC) --no treatment indicated  Demand ischemia (HCC) -- Elevated troponins noted, demand ischemia secondary to sepsis and pneumonia, 2D echocardiogram  reassuring with normal LVEF, normal RV systolic function, mitral and aortic valves unremarkable in appearance.  No further evaluation suggested.     Subjective:  Sleeping Less agitation overnight  Objective Vital signs were reviewed and unremarkable. Physical Exam Constitutional:      General: She is not in acute distress.    Appearance: She is not ill-appearing or toxic-appearing.  Cardiovascular:     Rate and Rhythm: Normal rate and regular rhythm.     Heart sounds: No murmur heard. Pulmonary:     Effort: Pulmonary effort is normal. No respiratory distress.     Breath sounds: No wheezing, rhonchi or rales.  Musculoskeletal:     Right lower leg: No edema.     Left lower leg: No edema.  Neurological:     Mental Status: She is alert.  Psychiatric:     Comments: Cannot assess mood or affect presently     Data Reviewed:  Urinalysis unremarkable  Family Communication: updated son by telephone 28 minute conversation  Disposition: Status is: Inpatient  Remains inpatient appropriate because: awaiting facility bed         Time spent: 25 minutes  Author: Brendia Sacks, MD 12/09/2021 3:21 PM  For on call review [email or web address].

## 2021-12-09 NOTE — Progress Notes (Signed)
0630- Pt has slept a majority of the night with short periods of being awake and restless, RN stopped posey restraint as pt was following directions while RN and NT cleaned soiled bed from purewick failure.  RN put new canister, tubing ect in room/on pt so urine sample can hopefully be obtained

## 2021-12-10 DIAGNOSIS — J189 Pneumonia, unspecified organism: Secondary | ICD-10-CM | POA: Diagnosis not present

## 2021-12-10 DIAGNOSIS — I248 Other forms of acute ischemic heart disease: Secondary | ICD-10-CM | POA: Diagnosis not present

## 2021-12-10 DIAGNOSIS — A419 Sepsis, unspecified organism: Secondary | ICD-10-CM | POA: Diagnosis not present

## 2021-12-10 DIAGNOSIS — G9341 Metabolic encephalopathy: Secondary | ICD-10-CM | POA: Diagnosis not present

## 2021-12-10 LAB — BASIC METABOLIC PANEL
Anion gap: 6 (ref 5–15)
BUN: 8 mg/dL (ref 8–23)
CO2: 25 mmol/L (ref 22–32)
Calcium: 8.3 mg/dL — ABNORMAL LOW (ref 8.9–10.3)
Chloride: 108 mmol/L (ref 98–111)
Creatinine, Ser: 0.67 mg/dL (ref 0.44–1.00)
GFR, Estimated: >60 mL/min (ref >60)
Glucose, Bld: 113 mg/dL — ABNORMAL HIGH (ref 70–99)
Potassium: 3.5 mmol/L (ref 3.5–5.1)
Sodium: 139 mmol/L (ref 135–145)

## 2021-12-10 LAB — RESP PANEL BY RT-PCR (FLU A&B, COVID) ARPGX2
Influenza A by PCR: NEGATIVE
Influenza B by PCR: NEGATIVE
SARS Coronavirus 2 by RT PCR: NEGATIVE

## 2021-12-10 MED ORDER — AMOXICILLIN-POT CLAVULANATE 875-125 MG PO TABS: 1.0000 | ORAL_TABLET | Freq: Two times a day (BID) | ORAL | Status: DC

## 2021-12-10 NOTE — Discharge Summary (Signed)
Physician Discharge Summary   Patient: Shamica Moree MRN: [id number] DOB: Oct 11, 1951  Admit date:     12/04/2021  Discharge date: 12/10/21  Discharge Physician: Brendia Sacks   PCP: Pcp, No   Recommendations at discharge:    Resolution of sepsis, delirium Outpatient Speech Therapy for dysphagia, advance diet as indicated, PT, OT at ALF After discussion with son, it appears Depakote and trazodone are fairly recent additions to her medical regimen, after adding, she had several falls.  She had been on Celexa and Zyprexa for many years, these will be continued.  Assess for tolerance, medication needs, dosage changes, reinitiation of Depakote or trazodone, defer to PCP at facility.  Discharge Diagnoses Active Problems:   Acute metabolic encephalopathy   Dementia (HCC)   Malnutrition of moderate degree   Thoracic compression fracture (HCC)   Dysphagia   Stage I pressure ulcer of sacral region   Demand ischemia (HCC)   Aortic atherosclerosis (HCC)   Thrombocytosis   Normocytic anemia  Principal Problem (Resolved):   Sepsis due to pneumonia [facility]) Resolved Problems:   Lobar pneumonia [facility])   Hospital Course   71 year old woman from memory care unit PMH dementia with wandering behavior, recent outpatient treatment with Levaquin for pneumonia presented with acute metabolic encephalopathy, pneumonia and sepsis.  Admitted for same.  Sepsis resolved, respiratory status stable, hospitalization complicated by dementia, agitation and confusion.  Now stable for transfer to facility.   * Sepsis due to pneumonia (HCC)-resolved as of 12/10/2021, (present on admission) -- Off oxygen.  Appears resolved.  Continue oral antibiotic trhough 1/10.  Acute metabolic encephalopathy -- Secondary to sepsis, pneumonia complicated by underlying dementia with wandering behavior.  Complicating treatment requiring restraints or Ativan at times.   --overall better, stable for discharge, expect will do best in  "home" environment  Dementia [facility])- (present on admission) -- With behavioral disturbance, wandering baseline, some agitation here requiring restraints and medication at times.   -- Calm today.  Lobar pneumonia (HCC)-resolved as of 12/10/2021 -- Multilobar pneumonia resulting in sepsis.  Appears resolved, off oxygen, respiratory status stable.  Changed to oral antibiotics.  Malnutrition of moderate degree -- Recommend follow-up with facility dietician  Stage I pressure ulcer of sacral region- (present on admission) --wound care  Dysphagia --diet per ST dysphagia 1, thin liquid, recommend outpatient speech therapy  Thoracic compression fracture (HCC)- (present on admission) -- Likely chronic process based on radiology commentary.  Previous physician discussed with neurosurgery, conservative management recommended.  Aortic atherosclerosis (HCC) --no treatment indicated  Demand ischemia (HCC) -- Elevated troponins noted, demand ischemia secondary to sepsis and pneumonia, 2D echocardiogram reassuring with normal LVEF, normal RV systolic function, mitral and aortic valves unremarkable in appearance.  No further evaluation suggested.      Consultants: none Procedures performed: none  Disposition:  return to Memory Care Diet recommendation: Dysphagia type 1 thin Liquid  DISCHARGE MEDICATION: Allergies as of 12/10/2021       Reactions   Cefdinir Diarrhea   Patient states this medication makes her feel weird as well as having diarrhea        Medication List     STOP taking these medications    divalproex 125 MG DR tablet Commonly known as: DEPAKOTE   ibuprofen 800 MG tablet Commonly known as: ADVIL   levofloxacin 750 MG tablet Commonly known as: LEVAQUIN   traZODone 50 MG tablet Commonly known as: DESYREL       TAKE these medications    acetaminophen 500 MG tablet  Commonly known as: TYLENOL Take 500 mg by mouth every 6 (six) hours as needed for mild pain,  moderate pain, fever or headache.   alum & mag hydroxide-simeth 200-200-20 MG/5ML suspension Commonly known as: MAALOX/MYLANTA Take 30 mLs by mouth every 6 (six) hours as needed for indigestion or heartburn.   amoxicillin-clavulanate 875-125 MG tablet Commonly known as: AUGMENTIN Take 1 tablet by mouth every 12 (twelve) hours. Last dose 1/10 in evening   citalopram 10 MG tablet Commonly known as: CELEXA Take 10 mg by mouth daily.   guaifenesin 100 MG/5ML syrup Commonly known as: ROBITUSSIN Take 200 mg by mouth 4 (four) times daily as needed for cough.   loperamide 2 MG capsule Commonly known as: IMODIUM Take 2 mg by mouth every 3 (three) hours as needed for diarrhea or loose stools.   magnesium hydroxide 400 MG/5ML suspension Commonly known as: MILK OF MAGNESIA Take 30 mLs by mouth at bedtime as needed for mild constipation.   neomycin-bacitracin-polymyxin [phone] ointment Apply 1 application topically daily as needed (minor skin tears).   OLANZapine 10 MG tablet Commonly known as: ZYPREXA Take 10 mg by mouth daily.   PRESCRIPTION MEDICATION Take 1 each by mouth in the morning, at noon, and at bedtime. Mighty Shakes               Discharge Care Instructions  (From admission, onward)           Start     Ordered   12/10/21 0000  Discharge wound care:       Comments: Daily assessment, foam dressing, keep clean   12/10/21 1201   12/10/21 0000  Discharge wound care:       Comments: Local care   12/10/21 1404            Contact information for follow-up providers     [name] Follow up.   Specialty: Home Health Services Why: HH physical therapy/occupational therapy/speech therapy. Contact information: [address] [place] [address] [phone]              Contact information for after-discharge care     Destination     [name] .   Service: Assisted Living Contact information: [address] [place] [id number] [phone]                    Confused, not able to offer history No issues noted overnight  Discharge Exam: Filed Weights   12/04/21 1547 12/05/21 0640 12/06/21 0400  Weight: 63.5 kg 54.9 kg 55.2 kg   Physical Exam Constitutional:      General: She is not in acute distress.    Appearance: She is not toxic-appearing.     Comments: Chronically ill-appearing  Cardiovascular:     Rate and Rhythm: Normal rate and regular rhythm.     Heart sounds: No murmur heard. Pulmonary:     Effort: Pulmonary effort is normal. No respiratory distress.     Breath sounds: No wheezing, rhonchi or rales.  Musculoskeletal:     Right lower leg: No edema.     Left lower leg: No edema.     Comments: Moves all extremities, tone and strength appear grossly normal  Neurological:     General: No focal deficit present.     Mental Status: She is alert.  Psychiatric:     Comments: Confused, disoriented, oriented to self alone.     Condition at discharge: fair  The results of significant diagnostics from this hospitalization (including imaging, microbiology, ancillary and laboratory) are listed below for reference.   Imaging Studies: DG Chest 2 View  Result Date: 11/29/2021 CLINICAL DATA:  Shortness of breath, weakness, and cough for 3 weeks. EXAM: CHEST - 2 VIEW COMPARISON:  08/30/2020 FINDINGS: Shallow inspiration. Heart size and pulmonary vascularity are normal. New airspace infiltration in the left lung base with small left pleural effusion, likely pneumonia. Linear atelectasis in the right mid lung. No pneumothorax. Mediastinal contours appear intact. Calcification of the aorta. Fracture of the right humeral neck as seen on previous study. IMPRESSION: Developing airspace infiltration and small effusion in the left base consistent with pneumonia. Electronically Signed   By: Burman Nieves M.D.   On: 11/29/2021 16:20   CT HEAD WO CONTRAST  ( )  Result Date: 12/04/2021 CLINICAL DATA:  Head trauma, minor EXAM: CT HEAD WITHOUT CONTRAST TECHNIQUE: Contiguous axial images were obtained from the base of the skull through the vertex without intravenous contrast. COMPARISON:  11/29/2021 FINDINGS: Brain: Today's study suffers from motion degradation. There is brain atrophy as seen previously. No sign of acute infarction, mass lesion, hemorrhage, hydrocephalus or extra-axial collection. Vascular: There is atherosclerotic calcification of the major vessels at the base of the brain. Skull: Negative, allowing for motion degradation. Sinuses/Orbits: Clear/negative Other: 9 IMPRESSION: Motion degraded study.  No acute or traumatic finding. Electronically Signed   By: Paulina Fusi M.D.   On: 12/04/2021 11:54   CT Head Wo Contrast  Result Date: 11/29/2021 CLINICAL DATA:  Mental status change, unknown cause EXAM: CT HEAD WITHOUT CONTRAST TECHNIQUE: Contiguous axial images were obtained from the base of the skull through the vertex without intravenous contrast. COMPARISON:  06/19/2021 head CT. FINDINGS: Brain: No evidence of parenchymal hemorrhage or extra-axial fluid collection. No mass lesion, mass effect, or midline shift. No CT evidence of acute infarction. Generalized cerebral volume loss. Nonspecific mild subcortical and periventricular white matter hypodensity, most in keeping with chronic small vessel ischemic change. No ventriculomegaly. Vascular: No acute abnormality. Skull: No evidence of calvarial fracture. Sinuses/Orbits: The visualized paranasal sinuses are essentially clear. Other:  The mastoid air cells are unopacified. IMPRESSION: 1. No evidence of acute intracranial abnormality. 2. Generalized cerebral volume loss and mild chronic small vessel ischemic changes in the cerebral white matter. Electronically Signed   By: Delbert Phenix M.D.   On: 11/29/2021 18:01   CT Angio Chest PE W and/or Wo Contrast  Result Date: 12/04/2021 CLINICAL DATA:   Pulmonary embolism suspected, high probability. Abdominal trauma, blunt. EXAM: CT ANGIOGRAPHY CHEST CT ABDOMEN AND PELVIS WITH CONTRAST TECHNIQUE: Multidetector CT imaging of the chest was performed using the standard protocol during bolus administration of intravenous contrast. Multiplanar CT image reconstructions and MIPs were obtained to evaluate the vascular anatomy. Multidetector CT imaging of the abdomen and pelvis was performed using the standard protocol during bolus administration of intravenous contrast. CONTRAST:  80mL OMNIPAQUE IOHEXOL 350 MG/ML SOLN COMPARISON:  Two-view chest x-ray 11/29/2021 and one-view chest x-ray 12/04/2021 FINDINGS: CTA CHEST FINDINGS Cardiovascular: Heart size is normal. Atherosclerotic calcifications are present at the aortic arch great vessel origins. No stenosis or aneurysm is present. No acute vascular injury is present. Pulmonary arteries demonstrate excellent opacification. No focal filling defects are present to suggest pulmonary emboli. Pulmonary artery size is normal. Mediastinum/Nodes: No enlarged mediastinal, hilar, or axillary lymph nodes. Thyroid gland, trachea, and esophagus demonstrate no significant findings. Lungs/Pleura: Patchy airspace opacities are present in the inferior upper lobes bilaterally.  Patchy airspace opacities are present inferiorly in the upper lobes bilaterally and in the right middle lobe. More extensive patchy airspace consolidation is present the right lung base. More dense consolidation is present in the medial and posterior left lower lobe. A small left pleural effusion is present. The airways are patent. Musculoskeletal: The T6 compression fracture is new since 11/29/2021. Approximately 50% loss of height is present without significant retropulsed bone. Remote superior endplate fractures are present at T9 and T11. No focal osseous lesions are present. No chest wall mass scratched at no chest wall lesions are present. Ribs are within normal  limits. Review of the MIP images confirms the above findings. CT ABDOMEN and PELVIS FINDINGS Hepatobiliary: Simple cyst in the right lobe of the liver on image 10 of series 2 measures 11 mm. No acute trauma is present. The common bile duct gallbladder normal. Pancreas: Unremarkable. No pancreatic ductal dilatation or surrounding inflammatory changes. Spleen: Splenic cyst measures 15 mm. Spleen is otherwise unremarkable. Adrenals/Urinary Tract: Adrenal glands are unremarkable. Kidneys are normal, without renal calculi, focal lesion, or hydronephrosis. Bladder is unremarkable. Stomach/Bowel: The stomach and duodenum are within normal limits. Small bowel is unremarkable. There is some stranding in mesentery. Terminal ileum is within normal limits. Appendix is visualized and within normal limits. The ascending and transverse colon are normal. Descending and sigmoid colon are within normal limits. Vascular/Lymphatic: Atherosclerotic calcifications are present in the aorta branch vessels without aneurysm. No significant adenopathy is present. Reproductive: Uterus and bilateral adnexa are unremarkable. Other: No abdominal wall hernia or abnormality. No abdominopelvic ascites. Musculoskeletal: Grade 1 degenerative anterolisthesis is present at L4-5. Chronic endplate changes are worse right than left associated with curvature of the spine at L5-S1. Bony pelvis is within normal limits. The hips are located and normal. Review of the MIP images confirms the above findings. IMPRESSION: 1. No pulmonary embolus. 2. Extensive bilateral airspace disease compatible with multi lobar pneumonia. The most prominent area of consolidation is in the left lower lobe. Aspiration considered less likely. 3. Small left pleural effusion. 4. Acute/subacute T6 compression fracture with approximately 50% loss of height. This is new since 11/29/2021. 5. Remote superior endplate fractures at T9 and T11. 6. No acute trauma to the abdomen or pelvis. 7.  Benign-appearing cysts of the liver and spleen. 8. Aortic Atherosclerosis (ICD10-I70.0). Electronically Signed   By: Marin Roberts M.D.   On: 12/04/2021 12:08   CT CERVICAL SPINE WO CONTRAST  Result Date: 12/04/2021 CLINICAL DATA:  Neck trauma EXAM: CT CERVICAL SPINE WITHOUT CONTRAST TECHNIQUE: Multidetector CT imaging of the cervical spine was performed without intravenous contrast. Multiplanar CT image reconstructions were also generated. COMPARISON:  06/19/2021 FINDINGS: Alignment: Minimal curvature convex to the right. Skull base and vertebrae: No evidence of regional fracture or focal bone lesion. Soft tissues and spinal canal: Negative Disc levels: Ordinary spondylosis C4-5 through C6-7. Mild facet osteoarthritis. Bony foraminal encroachment by osteophytes on the right at C5-6 and C6-7 and on the left at C4-5, C5-6 and C6-7. Upper chest: Negative Other: None IMPRESSION: No acute or traumatic finding. Ordinary mid cervical spondylosis as described. Electronically Signed   By: Paulina Fusi M.D.   On: 12/04/2021 11:55   CT ABDOMEN PELVIS W CONTRAST  Result Date: 12/04/2021 CLINICAL DATA:  Pulmonary embolism suspected, high probability. Abdominal trauma, blunt. EXAM: CT ANGIOGRAPHY CHEST CT ABDOMEN AND PELVIS WITH CONTRAST TECHNIQUE: Multidetector CT imaging of the chest was performed using the standard protocol during bolus administration of intravenous contrast. Multiplanar CT  image reconstructions and MIPs were obtained to evaluate the vascular anatomy. Multidetector CT imaging of the abdomen and pelvis was performed using the standard protocol during bolus administration of intravenous contrast. CONTRAST:  80mL OMNIPAQUE IOHEXOL 350 MG/ML SOLN COMPARISON:  Two-view chest x-ray 11/29/2021 and one-view chest x-ray 12/04/2021 FINDINGS: CTA CHEST FINDINGS Cardiovascular: Heart size is normal. Atherosclerotic calcifications are present at the aortic arch great vessel origins. No stenosis or aneurysm is  present. No acute vascular injury is present. Pulmonary arteries demonstrate excellent opacification. No focal filling defects are present to suggest pulmonary emboli. Pulmonary artery size is normal. Mediastinum/Nodes: No enlarged mediastinal, hilar, or axillary lymph nodes. Thyroid gland, trachea, and esophagus demonstrate no significant findings. Lungs/Pleura: Patchy airspace opacities are present in the inferior upper lobes bilaterally. Patchy airspace opacities are present inferiorly in the upper lobes bilaterally and in the right middle lobe. More extensive patchy airspace consolidation is present the right lung base. More dense consolidation is present in the medial and posterior left lower lobe. A small left pleural effusion is present. The airways are patent. Musculoskeletal: The T6 compression fracture is new since 11/29/2021. Approximately 50% loss of height is present without significant retropulsed bone. Remote superior endplate fractures are present at T9 and T11. No focal osseous lesions are present. No chest wall mass scratched at no chest wall lesions are present. Ribs are within normal limits. Review of the MIP images confirms the above findings. CT ABDOMEN and PELVIS FINDINGS Hepatobiliary: Simple cyst in the right lobe of the liver on image 10 of series 2 measures 11 mm. No acute trauma is present. The common bile duct gallbladder normal. Pancreas: Unremarkable. No pancreatic ductal dilatation or surrounding inflammatory changes. Spleen: Splenic cyst measures 15 mm. Spleen is otherwise unremarkable. Adrenals/Urinary Tract: Adrenal glands are unremarkable. Kidneys are normal, without renal calculi, focal lesion, or hydronephrosis. Bladder is unremarkable. Stomach/Bowel: The stomach and duodenum are within normal limits. Small bowel is unremarkable. There is some stranding in mesentery. Terminal ileum is within normal limits. Appendix is visualized and within normal limits. The ascending and  transverse colon are normal. Descending and sigmoid colon are within normal limits. Vascular/Lymphatic: Atherosclerotic calcifications are present in the aorta branch vessels without aneurysm. No significant adenopathy is present. Reproductive: Uterus and bilateral adnexa are unremarkable. Other: No abdominal wall hernia or abnormality. No abdominopelvic ascites. Musculoskeletal: Grade 1 degenerative anterolisthesis is present at L4-5. Chronic endplate changes are worse right than left associated with curvature of the spine at L5-S1. Bony pelvis is within normal limits. The hips are located and normal. Review of the MIP images confirms the above findings. IMPRESSION: 1. No pulmonary embolus. 2. Extensive bilateral airspace disease compatible with multi lobar pneumonia. The most prominent area of consolidation is in the left lower lobe. Aspiration considered less likely. 3. Small left pleural effusion. 4. Acute/subacute T6 compression fracture with approximately 50% loss of height. This is new since 11/29/2021. 5. Remote superior endplate fractures at T9 and T11. 6. No acute trauma to the abdomen or pelvis. 7. Benign-appearing cysts of the liver and spleen. 8. Aortic Atherosclerosis (ICD10-I70.0). Electronically Signed   By: Marin Roberts M.D.   On: 12/04/2021 12:08   CT T-SPINE NO CHARGE  Result Date: 12/04/2021 CLINICAL DATA:  Fall, altered mental status EXAM: CT LUMBAR SPINE WITHOUT CONTRAST TECHNIQUE: Multidetector CT imaging of the lumbar spine was performed without intravenous contrast administration. Multiplanar CT image reconstructions were also generated. COMPARISON:  None. FINDINGS: Thoracic spine. Alignment: Advanced kyphosis of the  thoracic spine. Vertebrae: Wedge-shaped compression deformity of T6 vertebral body with approximately 50% anterior vertebral body height loss. There is no surrounding edema. No retropulsion into the spinal canal. Paraspinal and other soft tissues: No evidence of  hematoma or significant soft tissue abnormality. Left lung opacity concerning for pneumonia. Disc levels: Advanced degenerate disc disease. No significant spinal canal narrowing. Lumbar spine: Segmentation: 5 lumbar type vertebrae. Alignment: Grade 1 anterolisthesis of L4. Vertebrae: No acute fracture or focal pathologic process. Paraspinal and other soft tissues: Negative. Disc levels: Disc osteophyte complex with prominent osteophytes at L5-S1. Moderate spinal canal and right neural foraminal narrowing. Multilevel facet joint arthropathy. IMPRESSION: 1. Wedge-shaped compression deformity of T6 vertebral body with approximately 50% anterior vertebral body height loss without surrounding edema, likely a chronic process. 2. Advanced kyphosis of the thoracic spine. 3. 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 No acute lumbar fracture or significant soft tissue abnormality. Electronically Signed   By: Larose Hires D.O.   On: 12/04/2021 13:03   CT L-SPINE NO CHARGE  Result Date: 12/04/2021 CLINICAL DATA:  Fall, altered mental status EXAM: CT LUMBAR SPINE WITHOUT CONTRAST TECHNIQUE: Multidetector CT imaging of the lumbar spine was performed without intravenous contrast administration. Multiplanar CT image reconstructions were also generated. COMPARISON:  None. FINDINGS: Thoracic spine. Alignment: Advanced kyphosis of the thoracic spine. Vertebrae: Wedge-shaped compression deformity of T6 vertebral body with approximately 50% anterior vertebral body height loss. There is no surrounding edema. No retropulsion into the spinal canal. Paraspinal and other soft tissues: No evidence of hematoma or significant soft tissue abnormality. Left lung opacity concerning for pneumonia. Disc levels: Advanced degenerate disc disease. No significant spinal canal narrowing. Lumbar spine: Segmentation: 5 lumbar type vertebrae. Alignment: Grade 1 anterolisthesis of L4. Vertebrae: No acute fracture or focal  pathologic process. Paraspinal and other soft tissues: Negative. Disc levels: Disc osteophyte complex with prominent osteophytes at L5-S1. Moderate spinal canal and right neural foraminal narrowing. Multilevel facet joint arthropathy. IMPRESSION: 1. Wedge-shaped compression deformity of T6 vertebral body with approximately 50% anterior vertebral body height loss without surrounding edema, likely a chronic process. 2. Advanced kyphosis of the thoracic spine. 3. 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 No acute lumbar fracture or significant soft tissue abnormality. Electronically Signed   By: Larose Hires D.O.   On: 12/04/2021 13:03   DG Chest Port 1 View  Result Date: 12/04/2021 CLINICAL DATA:  Altered mental status. EXAM: PORTABLE CHEST 1 VIEW COMPARISON:  08/30/2021 FINDINGS: 0912 hours. Lungs are hyperexpanded. Patchy airspace disease again noted left base compatible with pneumonia. No pulmonary edema or substantial pleural effusion. Bones are diffusely demineralized. Telemetry leads overlie the chest. IMPRESSION: Persistent patchy airspace disease left base compatible with pneumonia. Electronically Signed   By: Kennith Center M.D.   On: 12/04/2021 09:22   ECHOCARDIOGRAM COMPLETE  Result Date: 12/05/2021    ECHOCARDIOGRAM REPORT   Patient Name:   KEONDRA HAYDU Date of Exam: 12/05/2021 Medical Rec #:  [id number]     Height:       67.5 in Accession #:    [id number]    Weight:       121.0 lb Date of Birth:  Oct 04, 1951    BSA:          1.643 m Patient Age:    70 years      BP:           107/74 mmHg Patient Gender: F  HR:           848 bpm. Exam Location:  Inpatient Procedure: 2D Echo, 3D Echo, Cardiac Doppler and Color Doppler Indications:    R07.9* Chest pain, unspecified. Elevated troponin.  History:        Patient has no prior history of Echocardiogram examinations.                 Signs/Symptoms:Alzheimer's, Altered Mental Status and                 Bacteremia; Risk  Factors:Hypertension and Dyslipidemia.  Sonographer:    Sheralyn Boatman RDCS Referring Phys: [id number] [facility] POKHREL  Sonographer Comments: Technically difficult study due to poor echo windows. Patient supine with hand on chest. Patient could not follow directions. IMPRESSIONS  1. Left ventricular ejection fraction, by estimation, is 60 to 65%. The left ventricle has normal function. The left ventricle has no regional wall motion abnormalities. Left ventricular diastolic parameters are indeterminate. Elevated left ventricular end-diastolic pressure.  2. Right ventricular systolic function is normal. The right ventricular size is normal. There is normal pulmonary artery systolic pressure.  3. The mitral valve is normal in structure. No evidence of mitral valve regurgitation. No evidence of mitral stenosis.  4. The aortic valve is normal in structure. Aortic valve regurgitation is not visualized. Aortic valve sclerosis is present, with no evidence of aortic valve stenosis.  5. The inferior vena cava is normal in size with greater than 50% respiratory variability, suggesting right atrial pressure of 3 mmHg. FINDINGS  Left Ventricle: Left ventricular ejection fraction, by estimation, is 60 to 65%. The left ventricle has normal function. The left ventricle has no regional wall motion abnormalities. The left ventricular internal cavity size was small. There is no left ventricular hypertrophy. Left ventricular diastolic parameters are indeterminate. Elevated left ventricular end-diastolic pressure. Right Ventricle: The right ventricular size is normal. No increase in right ventricular wall thickness. Right ventricular systolic function is normal. There is normal pulmonary artery systolic pressure. The tricuspid regurgitant velocity is 2.17 m/s, and  with an assumed right atrial pressure of 3 mmHg, the estimated right ventricular systolic pressure is 21.8 mmHg. Left Atrium: Left atrial size was normal in size. Right Atrium: Right  atrial size was normal in size. Pericardium: There is no evidence of pericardial effusion. Mitral Valve: The mitral valve is normal in structure. No evidence of mitral valve regurgitation. No evidence of mitral valve stenosis. Tricuspid Valve: The tricuspid valve is normal in structure. Tricuspid valve regurgitation is trivial. No evidence of tricuspid stenosis. Aortic Valve: The aortic valve is normal in structure. Aortic valve regurgitation is not visualized. Aortic valve sclerosis is present, with no evidence of aortic valve stenosis. Pulmonic Valve: The pulmonic valve was normal in structure. Pulmonic valve regurgitation is not visualized. No evidence of pulmonic stenosis. Aorta: The aortic root is normal in size and structure. Venous: The inferior vena cava is normal in size with greater than 50% respiratory variability, suggesting right atrial pressure of 3 mmHg. IAS/Shunts: No atrial level shunt detected by color flow Doppler.  LEFT VENTRICLE PLAX 2D LVIDd:         3.50 cm     Diastology LVIDs:         2.40 cm     LV e' medial:    4.46 cm/s LV PW:         1.00 cm     LV E/e' medial:  25.0 LV IVS:  1.00 cm     LV e' lateral:   3.26 cm/s LVOT diam:     2.00 cm     LV E/e' lateral: 34.2 LV SV:         55 LV SV Index:   34 LVOT Area:     3.14 cm                             3D Volume EF: LV Volumes (MOD)           3D EF:        75 % LV vol d, MOD A2C: 47.3 ml LV EDV:       137 ml LV vol d, MOD A4C: 46.8 ml LV ESV:       34 ml LV vol s, MOD A2C: 17.6 ml LV SV:        102 ml LV vol s, MOD A4C: 16.1 ml LV SV MOD A2C:     29.7 ml LV SV MOD A4C:     46.8 ml LV SV MOD BP:      30.1 ml RIGHT VENTRICLE             IVC RV S prime:     13.20 cm/s  IVC diam: 1.70 cm TAPSE (M-mode): 1.9 cm LEFT ATRIUM             Index        RIGHT ATRIUM          Index LA diam:        3.30 cm 2.01 cm/m   RA Area:     8.77 cm LA Vol (A2C):   24.8 ml 15.10 ml/m  RA Volume:   17.80 ml 10.84 ml/m LA Vol (A4C):   18.3 ml 11.14 ml/m LA  Biplane Vol: 21.2 ml 12.91 ml/m  AORTIC VALVE LVOT Vmax:   118.00 cm/s LVOT Vmean:  82.000 cm/s LVOT VTI:    0.176 m  AORTA Ao Root diam: 3.50 cm MITRAL VALVE                TRICUSPID VALVE MV Area (PHT): 6.93 cm     TR Peak grad:   18.8 mmHg MV Decel Time: 110 msec     TR Vmax:        217.00 cm/s MV E velocity: 111.50 cm/s MV A velocity: 154.00 cm/s  SHUNTS MV E/A ratio:  0.72         Systemic VTI:  0.18 m                             Systemic Diam: 2.00 cm Kardie Tobb DO Electronically signed by Thomasene Ripple DO Signature Date/Time: 12/05/2021/4:08:56 PM    Final     Microbiology: Results for orders placed or performed during the hospital encounter of 12/04/21  Blood Culture (routine x 2)     Status: None   Collection Time: 12/04/21  9:01 AM   Specimen: BLOOD  Result Value Ref Range Status   Specimen Description   Final    BLOOD SITE NOT SPECIFIED Performed at [facility], [address], [place], [place] [address]    Special Requests   Final    BOTTLES DRAWN AEROBIC AND ANAEROBIC Blood Culture adequate volume Performed at [facility], [address] [place], [place] [address]    Culture   Final  NO GROWTH 5 DAYS Performed at [facility], [address], [place], [place] [address]    Report Status 12/09/2021 FINAL  Final  Resp Panel by RT-PCR (Flu A&B, Covid) Nasopharyngeal Swab     Status: None   Collection Time: 12/04/21  9:55 AM   Specimen: Nasopharyngeal Swab; Nasopharyngeal(NP) swabs in vial transport medium  Result Value Ref Range Status   SARS Coronavirus 2 by RT PCR NEGATIVE NEGATIVE Final    Comment: (NOTE) SARS-CoV-2 target nucleic acids are NOT DETECTED.  The SARS-CoV-2 RNA is generally detectable in upper respiratory specimens during the acute phase of infection. The lowest concentration of SARS-CoV-2 viral copies this assay can detect is 138 copies/mL. A negative result does not preclude SARS-Cov-2 infection and should not  be used as the sole basis for treatment or other patient management decisions. A negative result may occur with  improper specimen collection/handling, submission of specimen other than nasopharyngeal swab, presence of viral mutation(s) within the areas targeted by this assay, and inadequate number of viral copies(<138 copies/mL). A negative result must be combined with clinical observations, patient history, and epidemiological information. The expected result is Negative.  Fact Sheet for Patients:  BloggerCourse.com  Fact Sheet for Healthcare Providers:  SeriousBroker.it  This test is no t yet approved or cleared by the [place] FDA and  has been authorized for detection and/or diagnosis of SARS-CoV-2 by FDA under an Emergency Use Authorization (EUA). This EUA will remain  in effect (meaning this test can be used) for the duration of the COVID-19 declaration under Section 564(b)(1) of the Act, 21 U.S.C.section 360bbb-3(b)(1), unless the authorization is terminated  or revoked sooner.       Influenza A by PCR NEGATIVE NEGATIVE Final   Influenza B by PCR NEGATIVE NEGATIVE Final    Comment: (NOTE) The Xpert Xpress SARS-CoV-2/FLU/RSV plus assay is intended as an aid in the diagnosis of influenza from Nasopharyngeal swab specimens and should not be used as a sole basis for treatment. Nasal washings and aspirates are unacceptable for Xpert Xpress SARS-CoV-2/FLU/RSV testing.  Fact Sheet for Patients: BloggerCourse.com  Fact Sheet for Healthcare Providers: SeriousBroker.it  This test is not yet approved or cleared by the [place] FDA and has been authorized for detection and/or diagnosis of SARS-CoV-2 by FDA under an Emergency Use Authorization (EUA). This EUA will remain in effect (meaning this test can be used) for the duration of the COVID-19 declaration under Section  564(b)(1) of the Act, 21 U.S.C. section 360bbb-3(b)(1), unless the authorization is terminated or revoked.  Performed at [facility], [address] [place], [place] [address]   Urine Culture     Status: None   Collection Time: 12/04/21  1:30 PM   Specimen: In/Out Cath Urine  Result Value Ref Range Status   Specimen Description   Final    IN/OUT CATH URINE Performed at [facility], [address] [place], [place] [address]    Special Requests   Final    NONE Performed at [facility], [address], [place], [place] [address]    Culture   Final    NO GROWTH Performed at [facility], [address], [place], [place] [address]    Report Status 12/06/2021 FINAL  Final  MRSA Next Gen by PCR, Nasal     Status: None   Collection Time: 12/04/21  2:00 PM   Specimen: Nasal Mucosa; Nasal Swab  Result Value Ref Range  Status   MRSA by PCR Next Gen NOT DETECTED NOT DETECTED Final    Comment: (NOTE) The GeneXpert MRSA Assay (FDA approved for NASAL specimens only), is one component of a comprehensive MRSA colonization surveillance program. It is not intended to diagnose MRSA infection nor to guide or monitor treatment for MRSA infections. Test performance is not FDA approved in patients less than 92 years old. Performed at [facility], [address], [place], [place] [address]   Resp Panel by RT-PCR (Flu A&B, Covid) Nasopharyngeal Swab     Status: None   Collection Time: 12/10/21 12:20 PM   Specimen: Nasopharyngeal Swab; Nasopharyngeal(NP) swabs in vial transport medium  Result Value Ref Range Status   SARS Coronavirus 2 by RT PCR NEGATIVE NEGATIVE Final    Comment: (NOTE) SARS-CoV-2 target nucleic acids are NOT DETECTED.  The SARS-CoV-2 RNA is generally detectable in upper respiratory specimens during the acute phase of infection. The lowest concentration of SARS-CoV-2 viral copies this assay  can detect is 138 copies/mL. A negative result does not preclude SARS-Cov-2 infection and should not be used as the sole basis for treatment or other patient management decisions. A negative result may occur with  improper specimen collection/handling, submission of specimen other than nasopharyngeal swab, presence of viral mutation(s) within the areas targeted by this assay, and inadequate number of viral copies(<138 copies/mL). A negative result must be combined with clinical observations, patient history, and epidemiological information. The expected result is Negative.  Fact Sheet for Patients:  BloggerCourse.com  Fact Sheet for Healthcare Providers:  SeriousBroker.it  This test is no t yet approved or cleared by the [place] FDA and  has been authorized for detection and/or diagnosis of SARS-CoV-2 by FDA under an Emergency Use Authorization (EUA). This EUA will remain  in effect (meaning this test can be used) for the duration of the COVID-19 declaration under Section 564(b)(1) of the Act, 21 U.S.C.section 360bbb-3(b)(1), unless the authorization is terminated  or revoked sooner.       Influenza A by PCR NEGATIVE NEGATIVE Final   Influenza B by PCR NEGATIVE NEGATIVE Final    Comment: (NOTE) The Xpert Xpress SARS-CoV-2/FLU/RSV plus assay is intended as an aid in the diagnosis of influenza from Nasopharyngeal swab specimens and should not be used as a sole basis for treatment. Nasal washings and aspirates are unacceptable for Xpert Xpress SARS-CoV-2/FLU/RSV testing.  Fact Sheet for Patients: BloggerCourse.com  Fact Sheet for Healthcare Providers: SeriousBroker.it  This test is not yet approved or cleared by the [place] FDA and has been authorized for detection and/or diagnosis of SARS-CoV-2 by FDA under an Emergency Use Authorization (EUA). This EUA will  remain in effect (meaning this test can be used) for the duration of the COVID-19 declaration under Section 564(b)(1) of the Act, 21 U.S.C. section 360bbb-3(b)(1), unless the authorization is terminated or revoked.  Performed at [facility], [address], [place], [place] [address]     Labs: CBC: Recent Labs  Lab 12/04/21 0901 12/05/21 0426 12/06/21 0242  WBC 17.3* 10.1 11.7*  NEUTROABS 15.1* 7.5  --   HGB 11.4* 10.9* 11.1*  HCT 36.1 34.3* 36.2  MCV 89.8 89.1 90.0  PLT 542* 462* 436*   Basic Metabolic Panel: Recent Labs  Lab 12/04/21 0901 12/05/21 0426 12/06/21 0242 12/10/21 1225  NA 140 137 143 139  K 3.6 3.6 4.5 3.5  CL 110 106 107 108  CO2 24 27 27 25   GLUCOSE 124* 97  98 113*  BUN 9 5* 6* 8  CREATININE 0.96 0.82 0.71 0.67  CALCIUM 8.2* 7.9* 8.6* 8.3*  MG  --   --  2.1  --   PHOS  --   --  3.2  --    Liver Function Tests: Recent Labs  Lab 12/04/21 0901 12/05/21 0426  AST 38 33  ALT 38 32  ALKPHOS 106 91  BILITOT 0.6 0.7  PROT 7.2 6.5  ALBUMIN 3.1* 2.7*   CBG: Recent Labs  Lab 12/04/21 0843  GLUCAP 109*    Discharge time spent: greater than 30 minutes.  Signed: Brendia Sacksaniel Amaury Kuzel, MD [facility] 12/10/2021

## 2021-12-10 NOTE — TOC Transition Note (Addendum)
Transition of Care [facility]) - CM/SW Discharge Note   Patient Details  Name: Nancy [name] MRN: [id number] Date of Birth: Nov 08, 1951  [facility] CM/SW Contact:  Lanier Clam, RN Phone Number: 12/10/2021, 11:45 AM   Clinical Narrative:[name] able to accept back rep Cindy-awaiting PT recc;HHC services HHPT/OT/ST[name] already active;awaiting covid results, & d/c summary prior PTAR.   --PTAR called. No further CM needs.   Final next level of care: Memory Care Barriers to Discharge: No Barriers Identified   Patient Goals and CMS Choice Patient states their goals for this hospitalization and ongoing recovery are:: Oriented to self   Choice offered to / list presented to : Adult Children  Discharge Placement              Patient chooses bed at: [name] Patient to be transferred to facility by: PTAR Name of family member notified: Lysle Morales son [phone] Patient and family notified of of transfer: 12/10/21  Discharge Plan and Services                                     Social Determinants of Health (SDOH) Interventions     Readmission Risk Interventions No flowsheet data found.

## 2021-12-10 NOTE — Plan of Care (Signed)
°  Problem: Activity: Goal: Ability to tolerate increased activity will improve Outcome: Progressing Note: Up with PT/OT today

## 2021-12-10 NOTE — Evaluation (Addendum)
Occupational Therapy Evaluation Patient Details Name: Nancy [name] MRN: [id number] DOB: Sep 02, 1951 Today's Date: 12/10/2021   History of Present Illness Patient is a 71 year old female who presented to the hosptial from memory care unit with persistent couhg. patient was found to have sepsis due to pneumonia, acute metabolic encephalopathy. PMH: dementia with wandering behaviors, HTN and hyperlipidemia.   Clinical Impression   Patient is a confused 71 year old female who was noted to have had limited time out of bed with increased agitation since being in hospital and out of typical environment. Patient was able to participate in sitting EOB with no LOB with good + sitting balance. Patient was able to stand and take few steps to head of bed in room with patient reporting fatigue. Patient returned to bed with nurse present in room at this time. Patients needs for skilled OT services can be met in next level of care at ALF memory care unit.  Patient would continue to benefit from skilled OT services at this time while admitted and after d/c to address noted deficits in order to improve overall safety and independence in ADLs.       Recommendations for follow up therapy are one component of a multi-disciplinary discharge planning process, led by the attending physician.  Recommendations may be updated based on patient status, additional functional criteria and insurance authorization.   Follow Up Recommendations  Home health OT (return to ALF memory care unit)    Assistance Recommended at Discharge Frequent or constant Supervision/Assistance  Patient can return home with the following A lot of help with bathing/dressing/bathroom;A little help with walking and/or transfers;Assistance with feeding    Functional Status Assessment  Patient has had a recent decline in their functional status and demonstrates the ability to make significant improvements in function in a reasonable and predictable  amount of time.  Equipment Recommendations  None recommended by OT    Recommendations for Other Services       Precautions / Restrictions Precautions Precautions: Fall Restrictions Weight Bearing Restrictions: No      Mobility Bed Mobility Overal bed mobility: Needs Assistance Bed Mobility: Supine to Sit     Supine to sit: Min guard     General bed mobility comments: with increased time.    Transfers                          Balance Overall balance assessment: Needs assistance Sitting-balance support: No upper extremity supported Sitting balance-Leahy Scale: Good     Standing balance support: Single extremity supported;During functional activity Standing balance-Leahy Scale: Fair                             ADL either performed or assessed with clinical judgement   ADL Overall ADL's : At baseline                                       General ADL Comments: patient was max cues to attend to all tasks on this date. patient able to pick up wash cloth but unable to follow commands to wipe face herself. patient was resistive to hand over hand. patietn was noted to move face with wash cloth to ensure all areas were reached indicating patient has help with these tasks PLOF. patient was able to sit on edge  of bed with min A with + 2 present for safety. patient was able to take a few steps up to head of bed with increased time and min guard +2. patient was noted to be fagitued with minimal activity on this date and returned to bed at end of session. nurse present during evaluation     Vision   Additional Comments: difficult to assess with patients current cognitive status.     Perception     Praxis      Pertinent Vitals/Pain Pain Assessment: Faces Faces Pain Scale: No hurt Pain Intervention(s): Monitored during session     Hand Dominance Right   Extremity/Trunk Assessment Upper Extremity Assessment Upper Extremity  Assessment: Difficult to assess due to impaired cognition   Lower Extremity Assessment Lower Extremity Assessment: Defer to PT evaluation   Cervical / Trunk Assessment Cervical / Trunk Assessment: Normal   Communication Communication Communication: No difficulties   Cognition Arousal/Alertness: Awake/alert Behavior During Therapy: Restless;Flat affect Overall Cognitive Status: History of cognitive impairments - at baseline                                 General Comments: patient was writhing around in bed attempting to move. patient was able to follow one step commands inconsistently.     General Comments       Exercises     Shoulder Instructions      Home Living Family/patient expects to be discharged to:: Other (Comment) (ALF memory care)                                 Additional Comments: family not present during assessment. nurse reported patient has caregiver support at ALF for all tasks.      Prior Functioning/Environment Prior Level of Function : Needs assist  Cognitive Assist : ADLs (cognitive);Mobility (cognitive) Mobility (Cognitive): Step by step cues ADLs (Cognitive): Step by step cues Physical Assist : ADLs (physical)   ADLs (physical): Grooming;Bathing;Dressing;Toileting;IADLs            OT Problem List: Decreased activity tolerance;Impaired balance (sitting and/or standing);Decreased safety awareness;Decreased cognition      OT Treatment/Interventions: Self-care/ADL training;Energy conservation;DME and/or AE instruction;Therapeutic activities    OT Goals(Current goals can be found in the care plan section) Acute Rehab OT Goals OT Goal Formulation: Patient unable to participate in goal setting Time For Goal Achievement: 12/24/21 Potential to Achieve Goals: Good ADL Goals Pt Will Perform Grooming: with min assist;sitting Pt Will Transfer to Toilet: with supervision;ambulating Additional ADL Goal #1: Patient will  perform 10 min functional activity or exercise activity as evidence of improving activity tolerance  OT Frequency:      Co-evaluation              AM-PAC OT "6 Clicks" Daily Activity     Outcome Measure Help from another person eating meals?: A Little Help from another person taking care of personal grooming?: A Lot Help from another person toileting, which includes using toliet, bedpan, or urinal?: A Lot Help from another person bathing (including washing, rinsing, drying)?: A Lot Help from another person to put on and taking off regular upper body clothing?: A Lot Help from another person to put on and taking off regular lower body clothing?: A Lot 6 Click Score: 13   End of Session Equipment Utilized During Treatment: Gait belt Nurse Communication: Mobility  status  Activity Tolerance: Patient tolerated treatment well Patient left: in bed;with call bell/phone within reach;with chair alarm set;with restraints reapplied;with nursing/sitter in room  OT Visit Diagnosis: Unsteadiness on feet (R26.81)                Time: 9558-3167 OT Time Calculation (min): 17 min Charges:  OT General Charges $OT Visit: 1 Visit OT Evaluation $OT Eval Low Complexity: 1 Low  Leota Sauers, MS Acute Rehabilitation Department Office# [phone] Pager# [phone]   Marcellina Millin 12/10/2021, 12:47 PM

## 2021-12-10 NOTE — Evaluation (Signed)
Physical Therapy Evaluation Patient Details Name: Nancy [name] MRN: [id number] DOB: [birth year] Today's Date: 12/10/2021  History of Present Illness  Patient is a 71 year old female who presented to the hosptial from memory care unit with persistent couhg. patient was found to have sepsis due to pneumonia, acute metabolic encephalopathy. PMH: dementia with wandering behaviors, HTN and hyperlipidemia.  Clinical Impression  On eval, pt required Min A for mobility. She walked ~40 feet with a RW. Pt participated fairly well. Recommend return to ALF-pt may require some increased assistance-unsure of baseline. Will plan to follow during hospital stay.        Recommendations for follow up therapy are one component of a multi-disciplinary discharge planning process, led by the attending physician.  Recommendations may be updated based on patient status, additional functional criteria and insurance authorization.  Follow Up Recommendations Home health PT (at ALF)    Assistance Recommended at Discharge Frequent or constant Supervision/Assistance  Patient can return home with the following       Equipment Recommendations None recommended by PT  Recommendations for Other Services       Functional Status Assessment Patient has had a recent decline in their functional status and demonstrates the ability to make significant improvements in function in a reasonable and predictable amount of time.     Precautions / Restrictions Precautions Precautions: Fall Precaution Comments: incontinent Restrictions Weight Bearing Restrictions: No      Mobility  Bed Mobility Overal bed mobility: Needs Assistance Bed Mobility: Supine to Sit;Sit to Supine     Supine to sit: Min assist;HOB elevated Sit to supine: Min assist;HOB elevated   General bed mobility comments: Assist for trunk and bil LEs. Increased time. Multimodal/repeated cueing    Transfers Overall transfer level: Needs  assistance Equipment used: Rolling walker (2 wheels) Transfers: Sit to/from Stand Sit to Stand: Min assist           General transfer comment: Assist to rise, steady, control descent. Multimodal/repeated cueing. Increased time.    Ambulation/Gait Ambulation/Gait assistance: Min assist Gait Distance (Feet): 40 Feet Assistive device: Rolling walker (2 wheels) Gait Pattern/deviations: Step-through pattern;Decreased stride length;Narrow base of support       General Gait Details: Multimodal/repeated cueing. Assist to steady pt and to maneuver with RW.  Stairs            Wheelchair Mobility    Modified Rankin (Stroke Patients Only)       Balance Overall balance assessment: Needs assistance Sitting-balance support: No upper extremity supported Sitting balance-Leahy Scale: Good     Standing balance support: Single extremity supported;During functional activity Standing balance-Leahy Scale: Poor                               Pertinent Vitals/Pain Pain Assessment: Faces Faces Pain Scale: No hurt Pain Intervention(s): Monitored during session    Home Living Family/patient expects to be discharged to:: Assisted living (memory care unit)                   Additional Comments: family not present during assessment. nurse reported patient has caregiver support at ALF for all tasks.    Prior Function Prior Level of Function : Needs assist  Cognitive Assist : ADLs (cognitive);Mobility (cognitive) Mobility (Cognitive): Step by step cues ADLs (Cognitive): Step by step cues Physical Assist : ADLs (physical)   ADLs (physical): Grooming;Bathing;Dressing;Toileting;IADLs Mobility Comments: unsure of baseline function. chart mentioned "wandering"  Hand Dominance   Dominant Hand: Right    Extremity/Trunk Assessment   Upper Extremity Assessment Upper Extremity Assessment: Defer to OT evaluation    Lower Extremity Assessment Lower Extremity  Assessment: Generalized weakness    Cervical / Trunk Assessment Cervical / Trunk Assessment: Normal  Communication   Communication: No difficulties  Cognition Arousal/Alertness: Awake/alert Behavior During Therapy: Flat affect Overall Cognitive Status: History of cognitive impairments - at baseline                                 General Comments: multimodal cueing, repeated cueing, increased time-all required for successful participation        General Comments      Exercises     Assessment/Plan    PT Assessment Patient needs continued PT services  PT Problem List Decreased strength;Decreased mobility;Decreased activity tolerance;Decreased balance;Decreased knowledge of use of DME       PT Treatment Interventions DME instruction;Gait training;Therapeutic activities;Therapeutic exercise;Patient/family education;Balance training;Functional mobility training    PT Goals (Current goals can be found in the Care Plan section)  Acute Rehab PT Goals Patient Stated Goal: pt unable to state PT Goal Formulation: With patient Time For Goal Achievement: 12/24/21 Potential to Achieve Goals: Fair    Frequency Min 3X/week     Co-evaluation               AM-PAC PT "6 Clicks" Mobility  Outcome Measure Help needed turning from your back to your side while in a flat bed without using bedrails?: A Little Help needed moving from lying on your back to sitting on the side of a flat bed without using bedrails?: A Little Help needed moving to and from a bed to a chair (including a wheelchair)?: A Little Help needed standing up from a chair using your arms (e.g., wheelchair or bedside chair)?: A Little Help needed to walk in hospital room?: A Little Help needed climbing 3-5 steps with a railing? : Total 6 Click Score: 16    End of Session Equipment Utilized During Treatment: Gait belt Activity Tolerance: Patient tolerated treatment well Patient left: in bed;with  call bell/phone within reach;with bed alarm set   PT Visit Diagnosis: Difficulty in walking, not elsewhere classified (R26.2);Muscle weakness (generalized) (M62.81)    Time: 0102-7253 PT Time Calculation (min) (ACUTE ONLY): 20 min   Charges:   PT Evaluation $PT Eval Moderate Complexity: 1 Mod             Faye Ramsay, PT Acute Rehabilitation  Office: [phone] Pager: [phone]

## 2022-01-13 ENCOUNTER — Encounter (HOSPITAL_COMMUNITY): Payer: Self-pay | Admitting: Emergency Medicine

## 2022-01-13 ENCOUNTER — Other Ambulatory Visit: Payer: Self-pay

## 2022-01-13 ENCOUNTER — Emergency Department (HOSPITAL_COMMUNITY): Payer: Medicare HMO

## 2022-01-13 ENCOUNTER — Emergency Department (HOSPITAL_COMMUNITY)
Admission: EM | Admit: 2022-01-13 | Discharge: 2022-01-13 | Disposition: A | Discharge status: Skilled Nursing Facility | Payer: Medicare HMO | Attending: Emergency Medicine | Admitting: Emergency Medicine

## 2022-01-13 DIAGNOSIS — S0181XA Laceration without foreign body of other part of head, initial encounter: Secondary | ICD-10-CM | POA: Diagnosis not present

## 2022-01-13 DIAGNOSIS — W19XXXA Unspecified fall, initial encounter: Secondary | ICD-10-CM | POA: Diagnosis not present

## 2022-01-13 DIAGNOSIS — Y92008 Other place in unspecified non-institutional (private) residence as the place of occurrence of the external cause: Secondary | ICD-10-CM | POA: Insufficient documentation

## 2022-01-13 DIAGNOSIS — F0390 Unspecified dementia without behavioral disturbance: Secondary | ICD-10-CM | POA: Diagnosis not present

## 2022-01-13 DIAGNOSIS — I10 Essential (primary) hypertension: Secondary | ICD-10-CM | POA: Diagnosis not present

## 2022-01-13 MED ORDER — LIDOCAINE-EPINEPHRINE (PF) 2 %-1:200000 IJ SOLN
INTRAMUSCULAR | Status: AC
  Administered 2022-01-13: 20 mL
  Filled 2022-01-13: qty 20

## 2022-01-13 NOTE — ED Notes (Signed)
Laceration cleaned with normal saline and wound spray. Loose gauze dressing applied.

## 2022-01-13 NOTE — ED Provider Notes (Signed)
[facility]   CSN: [id number] Arrival date & time: 01/13/22  0932     History  No chief complaint on file.   Nancy [name] is a 71 y.o. female.  Pt is a 71 yo wf with a hx of htn, hyperlipidemia, and dementia.  She had a witnessed fall today at [facility].  She did not have a loc.  She sustained a lac to her forehead.  Pt denies any pain.  No blood thinners.      Home Medications Prior to Admission medications   Medication Sig Start Date End Date Taking? Authorizing Provider  acetaminophen (TYLENOL) 500 MG tablet Take 500 mg by mouth every 6 (six) hours as needed for mild pain, moderate pain, fever or headache.    [provider]  alum & mag hydroxide-simeth (MAALOX/MYLANTA) 200-200-20 MG/5ML suspension Take 30 mLs by mouth every 6 (six) hours as needed for indigestion or heartburn.    [provider]  amoxicillin-clavulanate (AUGMENTIN) 875-125 MG tablet Take 1 tablet by mouth every 12 (twelve) hours. Last dose 1/10 in evening 12/10/21   Standley Brooking, MD  citalopram (CELEXA) 10 MG tablet Take 10 mg by mouth daily. 07/20/20   [provider]  guaifenesin (ROBITUSSIN) 100 MG/5ML syrup Take 200 mg by mouth 4 (four) times daily as needed for cough.    [provider]  loperamide (IMODIUM) 2 MG capsule Take 2 mg by mouth every 3 (three) hours as needed for diarrhea or loose stools.    [provider]  magnesium hydroxide (MILK OF MAGNESIA) 400 MG/5ML suspension Take 30 mLs by mouth at bedtime as needed for mild constipation.    [provider]  neomycin-bacitracin-polymyxin (NEOSPORIN) [id number] ointment Apply 1 application topically daily as needed (minor skin tears).    [provider]  OLANZapine (ZYPREXA) 10 MG tablet Take 10 mg by mouth daily. 08/17/20   [provider]  PRESCRIPTION MEDICATION Take 1 each by mouth in the morning, at noon, and at bedtime.  Mighty Shakes    [provider]      Allergies    Cefdinir    Review of Systems   Review of Systems  Skin:  Positive for wound.  All other systems reviewed and are negative.  Physical Exam Updated Vital Signs BP 116/63 (BP Location: Right Arm)    Pulse 84    Temp 97.8 F (36.6 C) (Oral)    Resp 17    Ht 5' 7.5" (1.715 m)    Wt 55 kg    LMP  (LMP Unknown)    SpO2 98%    BMI 18.71 kg/m  Physical Exam Vitals and nursing note reviewed.  Constitutional:      Appearance: Normal appearance.  HENT:     Head: Normocephalic.      Right Ear: External ear normal.     Left Ear: External ear normal.     Nose: Nose normal.     Mouth/Throat:     Mouth: Mucous membranes are moist.     Pharynx: Oropharynx is clear.  Eyes:     Extraocular Movements: Extraocular movements intact.     Conjunctiva/sclera: Conjunctivae normal.     Pupils: Pupils are equal, round, and reactive to light.  Cardiovascular:     Rate and Rhythm: Normal rate and regular rhythm.     Pulses: Normal pulses.     Heart sounds: Normal heart sounds.  Pulmonary:     Effort: Pulmonary  effort is normal.     Breath sounds: Normal breath sounds.  Abdominal:     General: Abdomen is flat. Bowel sounds are normal.     Palpations: Abdomen is soft.  Musculoskeletal:        General: Normal range of motion.     Cervical back: Normal range of motion and neck supple.  Skin:    General: Skin is warm.     Capillary Refill: Capillary refill takes less than 2 seconds.  Neurological:     General: No focal deficit present.     Mental Status: She is alert and oriented to person, place, and time.  Psychiatric:        Mood and Affect: Mood normal.        Behavior: Behavior normal.    ED Results / Procedures / Treatments   Labs (all labs ordered are listed, but only abnormal results are displayed) Labs Reviewed - No data to display  EKG None  Radiology CT Head Wo Contrast  Result Date: 01/13/2022 CLINICAL DATA:   Patient fell. Laceration of the forehead. Head and back pain. EXAM: CT HEAD WITHOUT CONTRAST CT CERVICAL SPINE WITHOUT CONTRAST TECHNIQUE: Multidetector CT imaging of the head and cervical spine was performed following the standard protocol without intravenous contrast. Multiplanar CT image reconstructions of the cervical spine were also generated. RADIATION DOSE REDUCTION: This exam was performed according to the departmental dose-optimization program which includes automated exposure control, adjustment of the mA and/or kV according to patient size and/or use of iterative reconstruction technique. COMPARISON:  12/04/2021 FINDINGS: CT HEAD FINDINGS Brain: There is no evidence for acute hemorrhage, hydrocephalus, mass lesion, or abnormal extra-axial fluid collection. No definite CT evidence for acute infarction. Diffuse loss of parenchymal volume is consistent with atrophy. Patchy low attenuation in the deep hemispheric and periventricular white matter is nonspecific, but likely reflects chronic microvascular ischemic demyelination. Vascular: No hyperdense vessel or unexpected calcification. Skull: No evidence for fracture. No worrisome lytic or sclerotic lesion. Sinuses/Orbits: The visualized paranasal sinuses and mastoid air cells are clear. Visualized portions of the globes and intraorbital fat are unremarkable. Other: None. CT CERVICAL SPINE FINDINGS Alignment: Trace retrolisthesis of C2 on 3 compatible with facet degeneration. Skull base and vertebrae: No acute fracture. No primary bone lesion or focal pathologic process. Soft tissues and spinal canal: No prevertebral fluid or swelling. No visible canal hematoma. Disc levels:  Loss of disc height noted C5-6 and C6-7. Upper chest: Negative. Other: None. IMPRESSION: 1. No acute intracranial abnormality. 2. Atrophy with chronic small vessel ischemic disease. 3. No cervical spine fracture or subluxation. 4. Degenerative changes in the cervical spine as above.  Electronically Signed   By: Kennith Center M.D.   On: 01/13/2022 10:55    Procedures .Marland KitchenLaceration Repair  Date/Time: 01/13/2022 11:55 AM Performed by: Jacalyn Lefevre, MD Authorized by: Jacalyn Lefevre, MD   Consent:    Consent obtained:  Emergent situation Universal protocol:    Patient identity confirmed:  Arm band Anesthesia:    Anesthesia method:  Local infiltration   Local anesthetic:  Lidocaine 2% WITH epi Laceration details:    Location:  Face   Face location:  Forehead   Length (cm):  4 Pre-procedure details:    Preparation:  Patient was prepped and draped in usual sterile fashion Exploration:    Contaminated: no   Treatment:    Area cleansed with:  Saline   Amount of cleaning:  Standard   Irrigation solution:  Sterile saline Skin repair:  Repair method:  Sutures   Suture size:  5-0   Suture material:  Prolene   Suture technique:  Simple interrupted   Number of sutures:  8 Approximation:    Approximation:  Close Repair type:    Repair type:  Simple Post-procedure details:    Dressing:  Antibiotic ointment and non-adherent dressing   Procedure completion:  Tolerated well, no immediate complications    Medications Ordered in ED Medications  lidocaine-EPINEPHrine (XYLOCAINE W/EPI) 2 %-1:200000 (PF) injection (20 mLs  Given by Other 01/13/22 1143)    ED Course/ Medical Decision Making/ A&P                           Medical Decision Making Amount and/or Complexity of Data Reviewed Radiology: ordered.   This patient presents to the ED for concern of fall, this involves an extensive number of treatment options, and is a complaint that carries with it a high risk of complications and morbidity.  The differential diagnosis includes fracture, internal injury   Co morbidities that complicate the patient evaluation  dementia   Additional history obtained:  Additional history obtained from epic chart review    Imaging Studies ordered:  I ordered  imaging studies including CT head/CT c-spine  I independently visualized and interpreted imaging which showed nothing acute I agree with the radiologist interpretation   Cardiac Monitoring:  The patient was maintained on a cardiac monitor.  I personally viewed and interpreted the cardiac monitored which showed an underlying rhythm of: nsr   Medicines ordered and prescription drug management:  I ordered medication including lidocaine  for suturing  Reevaluation of the patient after these medicines showed that the patient improved I have reviewed the patients home medicines and have made no changes  Test Considered:  CT scans   Critical Interventions:  Suture     Problem List / ED Course:  Fall:  CT head/C-spine neg.  Pt is able to ambulate without any pain. Lac:  Sutured   Reevaluation:  After the interventions noted above, I reevaluated the patient and found that they have :improved   Social Determinants of Health:  PT lives at [facility]   Dispostion:  After consideration of the diagnostic results and the patients response to treatment, I feel that the patent would benefit from discharge with outpatient f/u.  Sutures need to be removed in 5 to 7 days.  I tried calling the son to update.  He did not answer and his voice mailbox was full. Final Clinical Impression(s) / ED Diagnoses Final diagnoses:  Fall, initial encounter  Laceration of forehead, initial encounter    Rx / DC Orders ED Discharge Orders     None         Jacalyn Lefevre, MD 01/13/22 1203

## 2022-01-13 NOTE — ED Notes (Signed)
Pt walked to bathroom with standby assist 

## 2022-01-13 NOTE — ED Notes (Signed)
Pt continuing to get off stretcher, increasingly difficult to redirect. Posey belt placed with security assistance.

## 2022-01-13 NOTE — ED Triage Notes (Signed)
BIB EMS from [facility], had a witnessed mechanical fall this morning, 3-4 cm laceration on L forehead area. Bleeding controlled. Staff denies LOC, pt not on blood thinners. Pt complains of head and back pain. EMS reports vitals are stable.

## 2022-01-13 NOTE — ED Notes (Signed)
Pt son able to take pt home. PTAR cancelled

## 2022-07-10 IMAGING — CT CT CERVICAL SPINE W/O CM
3 of 5 series · 13 of 33 positions shown, 16 images · non-contrast
Comparison: 12/04/2021

CLINICAL DATA: Patient fell. Laceration of the forehead. Head and
back pain.



[Series 4: c spine soft · axial · 0.34mm/px · z∈[+1266,+1406]mm · 6 of 88 slices shown, 8 images]
[im 9/88  soft-tissue]
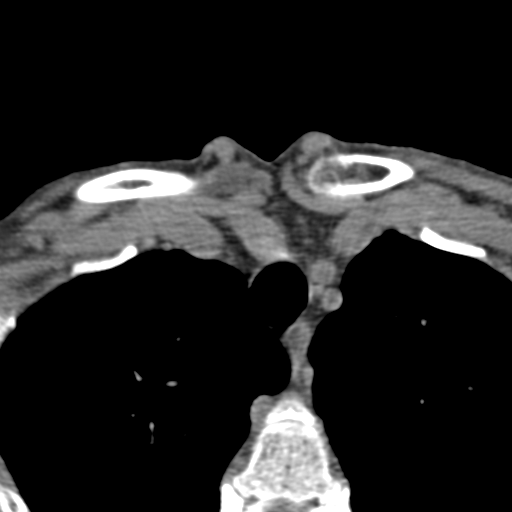
[im 9/88  bone]
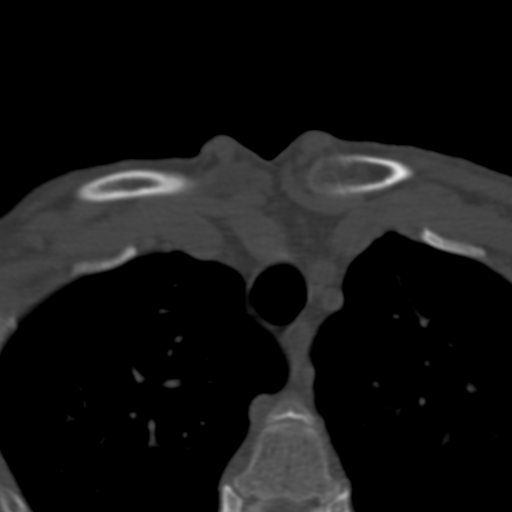
[im 27/88  bone]
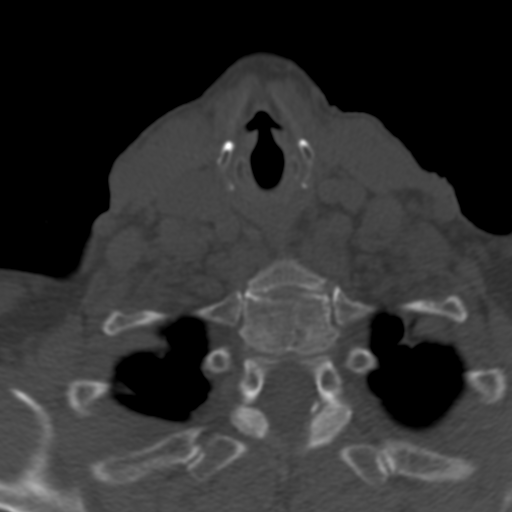
[im 35/88  bone]
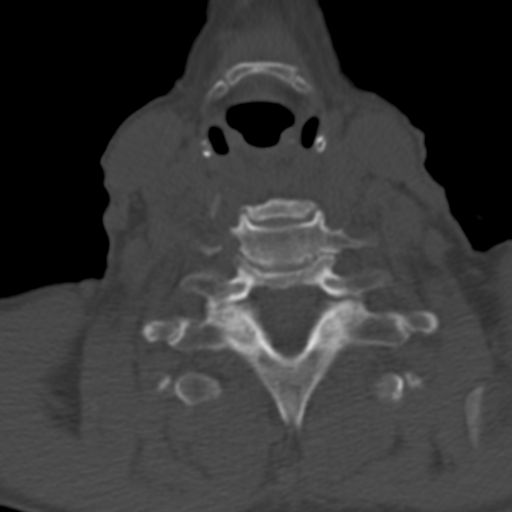
[im 53/88  bone]
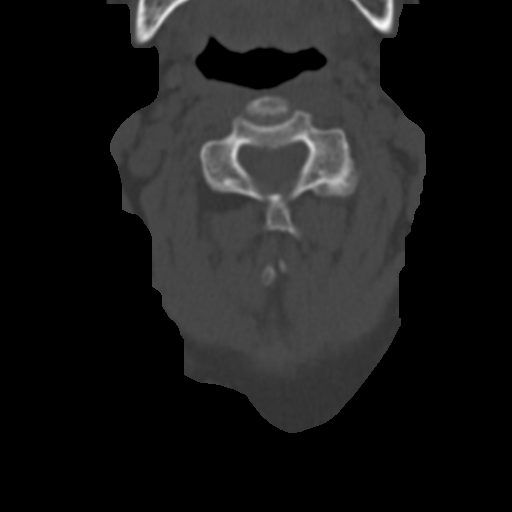
[im 61/88  soft-tissue]
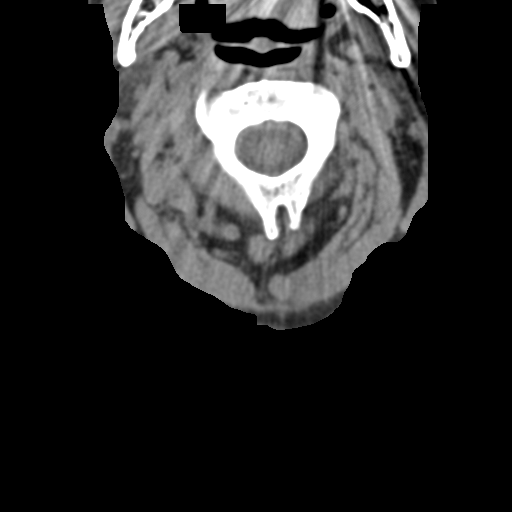
[im 61/88  bone]
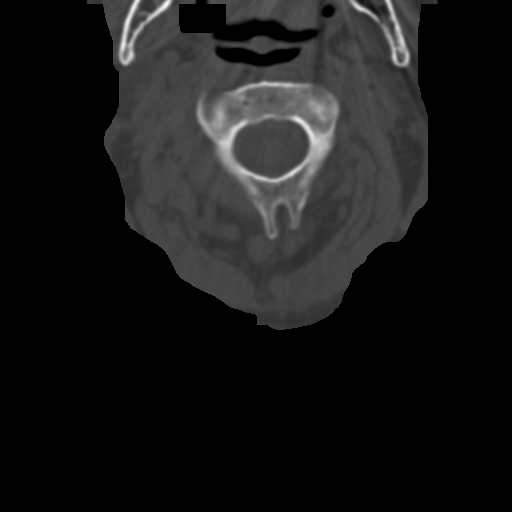
[im 79/88  bone]
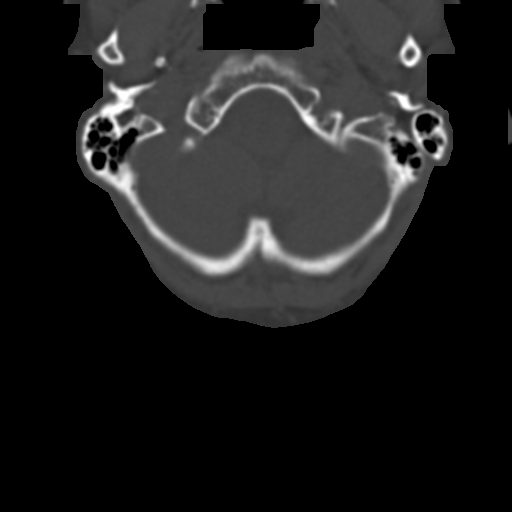

[Series 7: coronal bone · coronal · 0.36mm/px · 2 of 65 slices shown]
[im 22/65  bone]
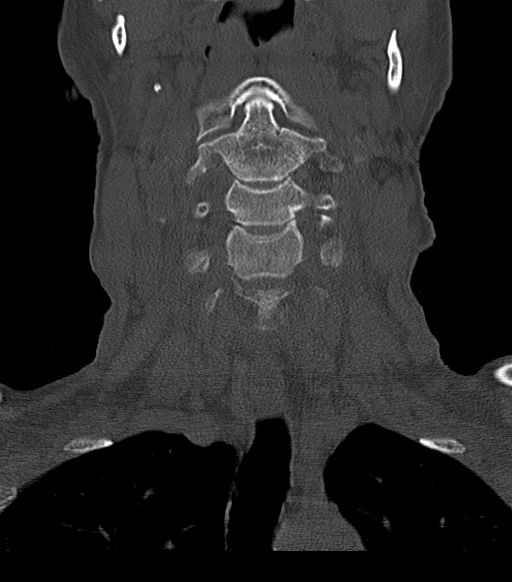
[im 43/65  bone]
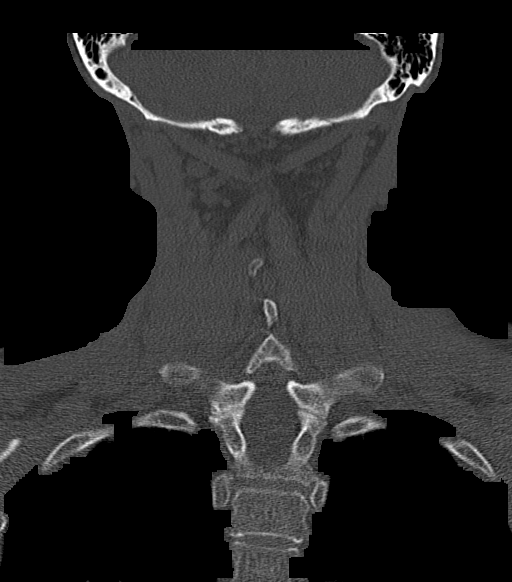

[Series 8: sagittal bone · sagittal · 0.36mm/px · 5 of 61 slices shown, 6 images]
[im 21/61  bone]
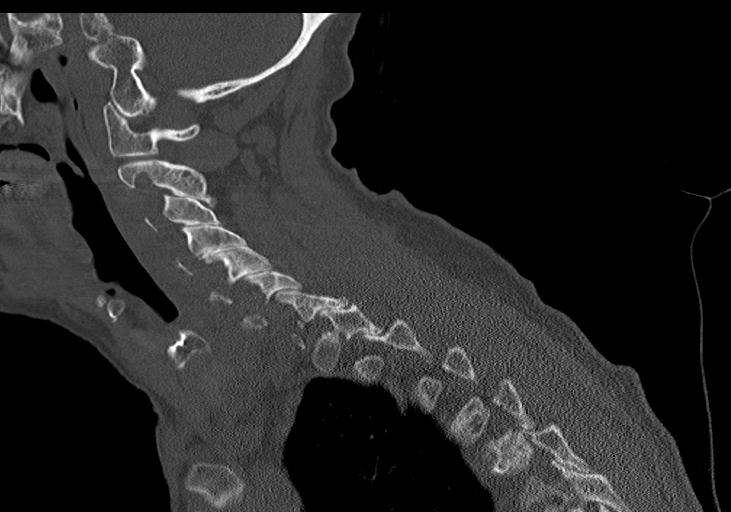
[im 26/61  bone]
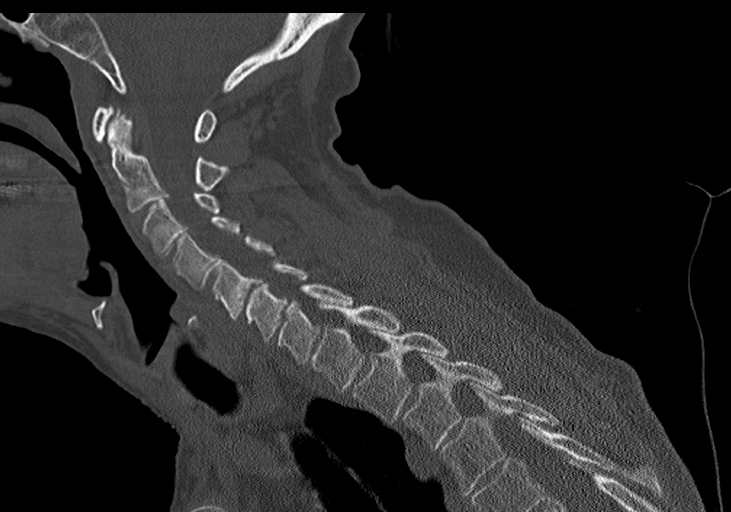
[im 31/61  soft-tissue]
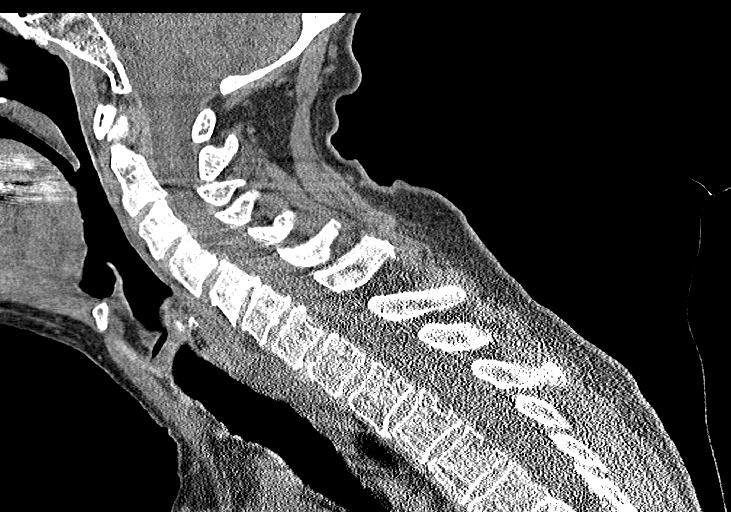
[im 31/61  bone]
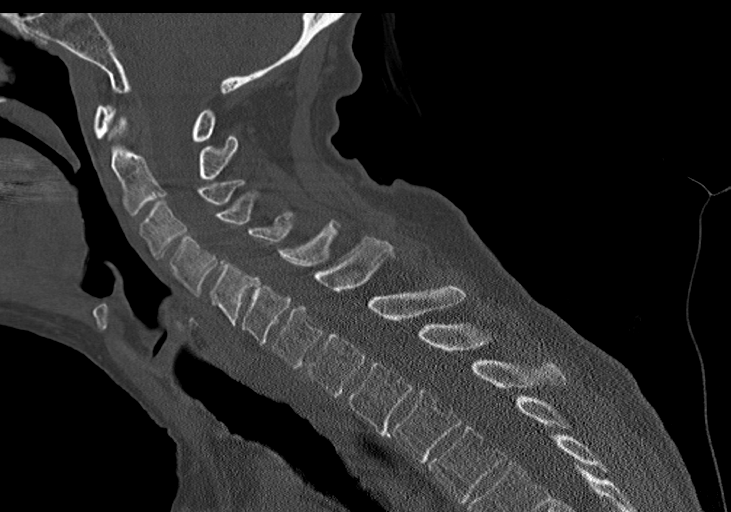
[im 36/61  bone]
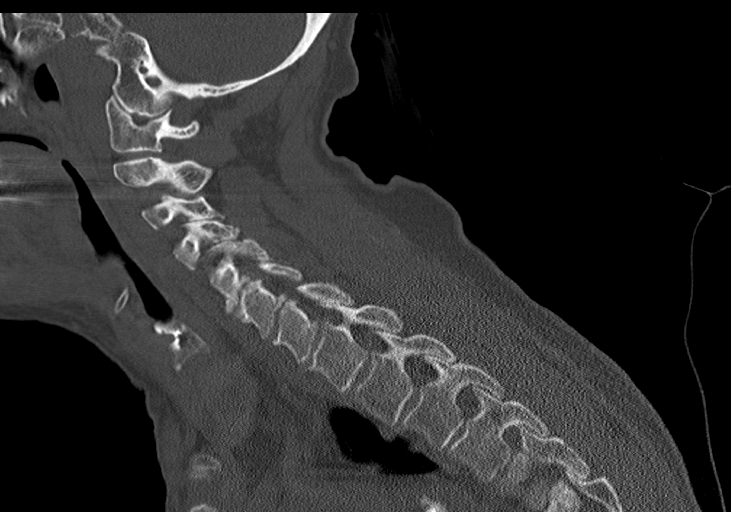
[im 41/61  bone]
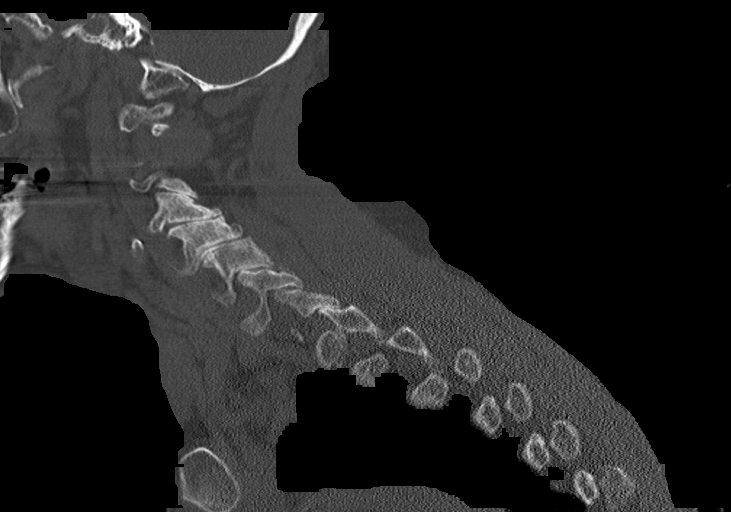

[13 of 33 positions shown; findings below may reference images not displayed]

FINDINGS: CT HEAD FINDINGS

Brain: There is no evidence for acute hemorrhage, hydrocephalus,
mass lesion, or abnormal extra-axial fluid collection. No definite
CT evidence for acute infarction. Diffuse loss of parenchymal volume
is consistent with atrophy. Patchy low attenuation in the deep
hemispheric and periventricular white matter is nonspecific, but
likely reflects chronic microvascular ischemic demyelination.

Vascular: No hyperdense vessel or unexpected calcification.

Skull: No evidence for fracture. No worrisome lytic or sclerotic
lesion.

Sinuses/Orbits: The visualized paranasal sinuses and mastoid air
cells are clear. Visualized portions of the globes and intraorbital
fat are unremarkable.

Other: None.

CT CERVICAL SPINE FINDINGS

Alignment: Trace retrolisthesis of C2 on 3 compatible with facet
degeneration.

Skull base and vertebrae: No acute fracture. No primary bone lesion
or focal pathologic process.

Soft tissues and spinal canal: No prevertebral fluid or swelling. No
visible canal hematoma.

Disc levels:  Loss of disc height noted C5-6 and C6-7.

Upper chest: Negative.

Other: None.
IMPRESSION: 1. No acute intracranial abnormality.
2. Atrophy with chronic small vessel ischemic disease.
3. No cervical spine fracture or subluxation.
4. Degenerative changes in the cervical spine as above.

## 2022-09-01 ENCOUNTER — Inpatient Hospital Stay (HOSPITAL_COMMUNITY)
Admission: EM | Admit: 2022-09-01 | Discharge: 2022-09-06 | DRG: 480 | Disposition: A | Discharge status: Skilled Nursing Facility | Source: Skilled Nursing Facility | Attending: Internal Medicine | Admitting: Internal Medicine

## 2022-09-01 ENCOUNTER — Encounter (HOSPITAL_COMMUNITY)
Admission: EM | Disposition: A | Discharge status: Skilled Nursing Facility | Payer: Self-pay | Source: Skilled Nursing Facility | Attending: Internal Medicine

## 2022-09-01 ENCOUNTER — Emergency Department (HOSPITAL_COMMUNITY)

## 2022-09-01 ENCOUNTER — Encounter (HOSPITAL_COMMUNITY): Payer: Self-pay | Admitting: Internal Medicine

## 2022-09-01 ENCOUNTER — Other Ambulatory Visit: Payer: Self-pay

## 2022-09-01 ENCOUNTER — Inpatient Hospital Stay (HOSPITAL_COMMUNITY)

## 2022-09-01 ENCOUNTER — Inpatient Hospital Stay (HOSPITAL_COMMUNITY): Admitting: Certified Registered Nurse Anesthetist

## 2022-09-01 DIAGNOSIS — E785 Hyperlipidemia, unspecified: Secondary | ICD-10-CM | POA: Diagnosis present

## 2022-09-01 DIAGNOSIS — R7989 Other specified abnormal findings of blood chemistry: Secondary | ICD-10-CM

## 2022-09-01 DIAGNOSIS — F0390 Unspecified dementia without behavioral disturbance: Secondary | ICD-10-CM | POA: Diagnosis not present

## 2022-09-01 DIAGNOSIS — D638 Anemia in other chronic diseases classified elsewhere: Secondary | ICD-10-CM | POA: Diagnosis present

## 2022-09-01 DIAGNOSIS — Z515 Encounter for palliative care: Secondary | ICD-10-CM

## 2022-09-01 DIAGNOSIS — S72001A Fracture of unspecified part of neck of right femur, initial encounter for closed fracture: Secondary | ICD-10-CM | POA: Diagnosis not present

## 2022-09-01 DIAGNOSIS — F0393 Unspecified dementia, unspecified severity, with mood disturbance: Secondary | ICD-10-CM | POA: Diagnosis present

## 2022-09-01 DIAGNOSIS — R338 Other retention of urine: Secondary | ICD-10-CM | POA: Diagnosis not present

## 2022-09-01 DIAGNOSIS — Z681 Body mass index (BMI) 19 or less, adult: Secondary | ICD-10-CM | POA: Diagnosis not present

## 2022-09-01 DIAGNOSIS — F32A Depression, unspecified: Secondary | ICD-10-CM | POA: Diagnosis present

## 2022-09-01 DIAGNOSIS — E559 Vitamin D deficiency, unspecified: Secondary | ICD-10-CM | POA: Diagnosis present

## 2022-09-01 DIAGNOSIS — D649 Anemia, unspecified: Secondary | ICD-10-CM

## 2022-09-01 DIAGNOSIS — G9341 Metabolic encephalopathy: Secondary | ICD-10-CM | POA: Diagnosis present

## 2022-09-01 DIAGNOSIS — D62 Acute posthemorrhagic anemia: Secondary | ICD-10-CM | POA: Diagnosis not present

## 2022-09-01 DIAGNOSIS — F03C4 Unspecified dementia, severe, with anxiety: Secondary | ICD-10-CM | POA: Diagnosis not present

## 2022-09-01 DIAGNOSIS — S7001XA Contusion of right hip, initial encounter: Secondary | ICD-10-CM | POA: Diagnosis present

## 2022-09-01 DIAGNOSIS — S50312A Abrasion of left elbow, initial encounter: Secondary | ICD-10-CM | POA: Diagnosis present

## 2022-09-01 DIAGNOSIS — E876 Hypokalemia: Secondary | ICD-10-CM | POA: Diagnosis not present

## 2022-09-01 DIAGNOSIS — Y92098 Other place in other non-institutional residence as the place of occurrence of the external cause: Secondary | ICD-10-CM

## 2022-09-01 DIAGNOSIS — S2241XA Multiple fractures of ribs, right side, initial encounter for closed fracture: Secondary | ICD-10-CM | POA: Diagnosis present

## 2022-09-01 DIAGNOSIS — W1830XA Fall on same level, unspecified, initial encounter: Secondary | ICD-10-CM | POA: Diagnosis present

## 2022-09-01 DIAGNOSIS — S72141A Displaced intertrochanteric fracture of right femur, initial encounter for closed fracture: Secondary | ICD-10-CM | POA: Diagnosis present

## 2022-09-01 DIAGNOSIS — E43 Unspecified severe protein-calorie malnutrition: Secondary | ICD-10-CM | POA: Diagnosis present

## 2022-09-01 DIAGNOSIS — R6510 Systemic inflammatory response syndrome (SIRS) of non-infectious origin without acute organ dysfunction: Secondary | ICD-10-CM

## 2022-09-01 DIAGNOSIS — I10 Essential (primary) hypertension: Secondary | ICD-10-CM | POA: Diagnosis present

## 2022-09-01 DIAGNOSIS — N390 Urinary tract infection, site not specified: Secondary | ICD-10-CM | POA: Diagnosis present

## 2022-09-01 HISTORY — PX: INTRAMEDULLARY (IM) NAIL INTERTROCHANTERIC: SHX5875

## 2022-09-01 LAB — URINALYSIS, ROUTINE W REFLEX MICROSCOPIC
Bacteria, UA: NONE SEEN
Bilirubin Urine: NEGATIVE
Glucose, UA: NEGATIVE mg/dL
Ketones, ur: NEGATIVE mg/dL
Leukocytes,Ua: NEGATIVE
Nitrite: NEGATIVE
Protein, ur: 30 mg/dL — AB
Specific Gravity, Urine: 1.025 (ref 1.005–1.030)
pH: 5 (ref 5.0–8.0)

## 2022-09-01 LAB — CBC WITH DIFFERENTIAL/PLATELET
Abs Immature Granulocytes: 0.05 10*3/uL (ref 0.00–0.07)
Basophils Absolute: 0 10*3/uL (ref 0.0–0.1)
Basophils Relative: 0 %
Eosinophils Absolute: 0 10*3/uL (ref 0.0–0.5)
Eosinophils Relative: 0 %
HCT: 27.9 % — ABNORMAL LOW (ref 36.0–46.0)
Hemoglobin: 9.1 g/dL — ABNORMAL LOW (ref 12.0–15.0)
Immature Granulocytes: 0 %
Lymphocytes Relative: 9 %
Lymphs Abs: 1.2 10*3/uL (ref 0.7–4.0)
MCH: 29.5 pg (ref 26.0–34.0)
MCHC: 32.6 g/dL (ref 30.0–36.0)
MCV: 90.6 fL (ref 80.0–100.0)
Monocytes Absolute: 1 10*3/uL (ref 0.1–1.0)
Monocytes Relative: 8 %
Neutro Abs: 11.2 10*3/uL — ABNORMAL HIGH (ref 1.7–7.7)
Neutrophils Relative %: 83 %
Platelets: 200 10*3/uL (ref 150–400)
RBC: 3.08 MIL/uL — ABNORMAL LOW (ref 3.87–5.11)
RDW: 14.2 % (ref 11.5–15.5)
WBC: 13.4 10*3/uL — ABNORMAL HIGH (ref 4.0–10.5)
nRBC: 0 % (ref 0.0–0.2)

## 2022-09-01 LAB — COMPREHENSIVE METABOLIC PANEL
ALT: 37 U/L (ref 0–44)
AST: 112 U/L — ABNORMAL HIGH (ref 15–41)
Albumin: 3.3 g/dL — ABNORMAL LOW (ref 3.5–5.0)
Alkaline Phosphatase: 64 U/L (ref 38–126)
Anion gap: 3 — ABNORMAL LOW (ref 5–15)
BUN: 17 mg/dL (ref 8–23)
CO2: 27 mmol/L (ref 22–32)
Calcium: 8.3 mg/dL — ABNORMAL LOW (ref 8.9–10.3)
Chloride: 111 mmol/L (ref 98–111)
Creatinine, Ser: 0.79 mg/dL (ref 0.44–1.00)
GFR, Estimated: >60 mL/min (ref >60)
Glucose, Bld: 114 mg/dL — ABNORMAL HIGH (ref 70–99)
Potassium: 3.5 mmol/L (ref 3.5–5.1)
Sodium: 141 mmol/L (ref 135–145)
Total Bilirubin: 1.1 mg/dL (ref 0.3–1.2)
Total Protein: 6.3 g/dL — ABNORMAL LOW (ref 6.5–8.1)

## 2022-09-01 LAB — CBG MONITORING, ED: Glucose-Capillary: 112 mg/dL — ABNORMAL HIGH (ref 70–99)

## 2022-09-01 LAB — PROTIME-INR
INR: 1.2 (ref 0.8–1.2)
Prothrombin Time: 15.2 seconds (ref 11.4–15.2)

## 2022-09-01 LAB — ABO/RH: ABO/RH(D): A POS

## 2022-09-01 LAB — APTT: aPTT: 30 seconds (ref 24–36)

## 2022-09-01 LAB — LACTIC ACID, PLASMA: Lactic Acid, Venous: 0.9 mmol/L (ref 0.5–1.9)

## 2022-09-01 SURGERY — FIXATION, FRACTURE, INTERTROCHANTERIC, WITH INTRAMEDULLARY ROD
Anesthesia: General | Laterality: Right

## 2022-09-01 MED ORDER — LORAZEPAM 2 MG/ML IJ SOLN
0.5000 mg | Freq: Once | INTRAMUSCULAR | Status: AC
  Administered 2022-09-01: 0.5 mg via INTRAVENOUS
  Filled 2022-09-01: qty 1

## 2022-09-01 MED ORDER — METHOCARBAMOL 500 MG PO TABS: 500.0000 mg | ORAL_TABLET | Freq: Four times a day (QID) | ORAL | Status: DC | PRN

## 2022-09-01 MED ORDER — CEFAZOLIN SODIUM-DEXTROSE 2-4 GM/100ML-% IV SOLN
2.0000 g | Freq: Three times a day (TID) | INTRAVENOUS | Status: AC
  Administered 2022-09-02 (×2): 2 g via INTRAVENOUS
  Filled 2022-09-01 (×2): qty 100

## 2022-09-01 MED ORDER — PHENOL 1.4 % MT LIQD: 1.0000 | OROMUCOSAL | Status: DC | PRN

## 2022-09-01 MED ORDER — DEXAMETHASONE SODIUM PHOSPHATE 10 MG/ML IJ SOLN
INTRAMUSCULAR | Status: DC | PRN
  Administered 2022-09-01: 10 mg via INTRAVENOUS

## 2022-09-01 MED ORDER — MORPHINE SULFATE (PF) 2 MG/ML IV SOLN: 0.5000 mg | INTRAVENOUS | Status: DC | PRN

## 2022-09-01 MED ORDER — BUPIVACAINE-EPINEPHRINE (PF) 0.25% -1:200000 IJ SOLN
INTRAMUSCULAR | Status: AC
  Filled 2022-09-01: qty 30

## 2022-09-01 MED ORDER — SUGAMMADEX SODIUM 200 MG/2ML IV SOLN
INTRAVENOUS | Status: DC | PRN
  Administered 2022-09-01: 200 mg via INTRAVENOUS

## 2022-09-01 MED ORDER — ASPIRIN 81 MG PO CHEW
81.0000 mg | CHEWABLE_TABLET | Freq: Two times a day (BID) | ORAL | Status: DC
  Administered 2022-09-02 – 2022-09-06 (×9): 81 mg via ORAL
  Filled 2022-09-01 (×8): qty 1

## 2022-09-01 MED ORDER — ACETAMINOPHEN 325 MG PO TABS: 325.0000 mg | ORAL_TABLET | Freq: Four times a day (QID) | ORAL | Status: DC | PRN

## 2022-09-01 MED ORDER — ONDANSETRON HCL 4 MG/2ML IJ SOLN: 4.0000 mg | Freq: Four times a day (QID) | INTRAMUSCULAR | Status: DC | PRN

## 2022-09-01 MED ORDER — LACTATED RINGERS IV SOLN: INTRAVENOUS | Status: DC | PRN

## 2022-09-01 MED ORDER — FENTANYL CITRATE (PF) 100 MCG/2ML IJ SOLN
INTRAMUSCULAR | Status: AC
  Filled 2022-09-01: qty 2

## 2022-09-01 MED ORDER — SODIUM CHLORIDE 0.9 % IV SOLN
1.0000 g | INTRAVENOUS | Status: AC
  Administered 2022-09-01 – 2022-09-03 (×3): 1 g via INTRAVENOUS
  Filled 2022-09-01 (×3): qty 10

## 2022-09-01 MED ORDER — ACETAMINOPHEN 10 MG/ML IV SOLN
INTRAVENOUS | Status: AC
  Filled 2022-09-01: qty 100

## 2022-09-01 MED ORDER — PROPOFOL 10 MG/ML IV BOLUS
INTRAVENOUS | Status: DC | PRN
  Administered 2022-09-01: 50 mg via INTRAVENOUS

## 2022-09-01 MED ORDER — SODIUM CHLORIDE 0.9 % IV SOLN: INTRAVENOUS | Status: DC

## 2022-09-01 MED ORDER — TRANEXAMIC ACID-NACL 1000-0.7 MG/100ML-% IV SOLN
1000.0000 mg | Freq: Once | INTRAVENOUS | Status: AC
  Administered 2022-09-02: 1000 mg via INTRAVENOUS
  Filled 2022-09-01: qty 100

## 2022-09-01 MED ORDER — ONDANSETRON HCL 4 MG/2ML IJ SOLN
INTRAMUSCULAR | Status: DC | PRN
  Administered 2022-09-01: 4 mg via INTRAVENOUS

## 2022-09-01 MED ORDER — FENTANYL CITRATE (PF) 100 MCG/2ML IJ SOLN
INTRAMUSCULAR | Status: DC | PRN
  Administered 2022-09-01: 50 ug via INTRAVENOUS

## 2022-09-01 MED ORDER — ACETAMINOPHEN 500 MG PO TABS
1000.0000 mg | ORAL_TABLET | Freq: Four times a day (QID) | ORAL | Status: AC
  Administered 2022-09-02 – 2022-09-03 (×3): 1000 mg via ORAL
  Filled 2022-09-01 (×3): qty 2

## 2022-09-01 MED ORDER — LIDOCAINE HCL (PF) 2 % IJ SOLN
INTRAMUSCULAR | Status: AC
  Filled 2022-09-01: qty 5

## 2022-09-01 MED ORDER — LACTATED RINGERS IV BOLUS
500.0000 mL | Freq: Once | INTRAVENOUS | Status: AC
Start: 2022-09-01 — End: 2022-09-01
  Administered 2022-09-01: 500 mL via INTRAVENOUS

## 2022-09-01 MED ORDER — HYDROCODONE-ACETAMINOPHEN 5-325 MG PO TABS: 1.0000 | ORAL_TABLET | Freq: Four times a day (QID) | ORAL | Status: DC | PRN

## 2022-09-01 MED ORDER — METOCLOPRAMIDE HCL 5 MG PO TABS: 5.0000 mg | ORAL_TABLET | Freq: Three times a day (TID) | ORAL | Status: DC | PRN

## 2022-09-01 MED ORDER — PROPOFOL 10 MG/ML IV BOLUS
INTRAVENOUS | Status: AC
  Filled 2022-09-01: qty 20

## 2022-09-01 MED ORDER — ACETAMINOPHEN 10 MG/ML IV SOLN
1000.0000 mg | Freq: Once | INTRAVENOUS | Status: AC
  Administered 2022-09-01: 1000 mg via INTRAVENOUS

## 2022-09-01 MED ORDER — PHENYLEPHRINE 80 MCG/ML (10ML) SYRINGE FOR IV PUSH (FOR BLOOD PRESSURE SUPPORT)
PREFILLED_SYRINGE | INTRAVENOUS | Status: DC | PRN
  Administered 2022-09-01: 160 ug via INTRAVENOUS
  Administered 2022-09-01 (×3): 80 ug via INTRAVENOUS

## 2022-09-01 MED ORDER — ONDANSETRON HCL 4 MG PO TABS: 4.0000 mg | ORAL_TABLET | Freq: Four times a day (QID) | ORAL | Status: DC | PRN

## 2022-09-01 MED ORDER — PHENYLEPHRINE HCL-NACL 20-0.9 MG/250ML-% IV SOLN
INTRAVENOUS | Status: DC | PRN
  Administered 2022-09-01: 35 ug/min via INTRAVENOUS

## 2022-09-01 MED ORDER — METHOCARBAMOL 1000 MG/10ML IJ SOLN
500.0000 mg | Freq: Four times a day (QID) | INTRAVENOUS | Status: DC | PRN
  Administered 2022-09-03: 500 mg via INTRAVENOUS
  Filled 2022-09-01: qty 500

## 2022-09-01 MED ORDER — ROCURONIUM BROMIDE 10 MG/ML (PF) SYRINGE
PREFILLED_SYRINGE | INTRAVENOUS | Status: DC | PRN
  Administered 2022-09-01: 50 mg via INTRAVENOUS

## 2022-09-01 MED ORDER — BUPIVACAINE HCL 0.25 % IJ SOLN
INTRAMUSCULAR | Status: DC | PRN
  Administered 2022-09-01: 30 mL

## 2022-09-01 MED ORDER — SODIUM CHLORIDE 0.9 % IR SOLN
Status: DC | PRN
  Administered 2022-09-01: 1000 mL

## 2022-09-01 MED ORDER — ISOPROPYL ALCOHOL 70 % SOLN
Status: DC | PRN
  Administered 2022-09-01: 1 via TOPICAL

## 2022-09-01 MED ORDER — DOCUSATE SODIUM 100 MG PO CAPS
100.0000 mg | ORAL_CAPSULE | Freq: Two times a day (BID) | ORAL | Status: DC
  Administered 2022-09-02 – 2022-09-06 (×8): 100 mg via ORAL
  Filled 2022-09-01 (×8): qty 1

## 2022-09-01 MED ORDER — FENTANYL CITRATE PF 50 MCG/ML IJ SOSY: 25.0000 ug | PREFILLED_SYRINGE | INTRAMUSCULAR | Status: DC | PRN

## 2022-09-01 MED ORDER — METOCLOPRAMIDE HCL 5 MG/ML IJ SOLN: 5.0000 mg | Freq: Three times a day (TID) | INTRAMUSCULAR | Status: DC | PRN

## 2022-09-01 MED ORDER — LIDOCAINE 2% (20 MG/ML) 5 ML SYRINGE
INTRAMUSCULAR | Status: DC | PRN
  Administered 2022-09-01: 60 mg via INTRAVENOUS

## 2022-09-01 MED ORDER — ESMOLOL HCL 100 MG/10ML IV SOLN
INTRAVENOUS | Status: AC
  Filled 2022-09-01: qty 10

## 2022-09-01 MED ORDER — FENTANYL CITRATE PF 50 MCG/ML IJ SOSY
25.0000 ug | PREFILLED_SYRINGE | Freq: Once | INTRAMUSCULAR | Status: AC
  Administered 2022-09-01: 25 ug via INTRAVENOUS
  Filled 2022-09-01: qty 1

## 2022-09-01 MED ORDER — HYDROMORPHONE HCL 1 MG/ML IJ SOLN: 0.5000 mg | INTRAMUSCULAR | Status: DC | PRN

## 2022-09-01 MED ORDER — TRANEXAMIC ACID-NACL 1000-0.7 MG/100ML-% IV SOLN
INTRAVENOUS | Status: AC
  Filled 2022-09-01: qty 100

## 2022-09-01 MED ORDER — ONDANSETRON HCL 4 MG/2ML IJ SOLN: 4.0000 mg | Freq: Once | INTRAMUSCULAR | Status: DC | PRN

## 2022-09-01 MED ORDER — MENTHOL 3 MG MT LOZG: 1.0000 | LOZENGE | OROMUCOSAL | Status: DC | PRN

## 2022-09-01 MED ORDER — OXYCODONE HCL 5 MG PO TABS: 5.0000 mg | ORAL_TABLET | ORAL | Status: DC | PRN

## 2022-09-01 MED ORDER — ESMOLOL HCL 100 MG/10ML IV SOLN
INTRAVENOUS | Status: DC | PRN
  Administered 2022-09-01 (×2): 20 mg via INTRAVENOUS
  Administered 2022-09-01: 30 mg via INTRAVENOUS

## 2022-09-01 MED ORDER — TRANEXAMIC ACID-NACL 1000-0.7 MG/100ML-% IV SOLN
1000.0000 mg | INTRAVENOUS | Status: AC
  Administered 2022-09-01: 1000 mg via INTRAVENOUS

## 2022-09-01 SURGICAL SUPPLY — 43 items
BAG COUNTER SPONGE SURGICOUNT (BAG) IMPLANT
BIT DRILL INTERTAN LAG SCREW (BIT) IMPLANT
BLADE SURG 15 STRL LF DISP TIS (BLADE) ×1 IMPLANT
BLADE SURG 15 STRL SS (BLADE) ×1
BNDG GAUZE DERMACEA FLUFF 4 (GAUZE/BANDAGES/DRESSINGS) ×1 IMPLANT
COVER PERINEAL POST (MISCELLANEOUS) ×1 IMPLANT
COVER SURGICAL LIGHT HANDLE (MISCELLANEOUS) ×1 IMPLANT
DRAPE INCISE IOBAN 66X45 STRL (DRAPES) ×1 IMPLANT
DRAPE STERI IOBAN 125X83 (DRAPES) ×1 IMPLANT
DRESSING AQUACEL AG SP 3.5X4 (GAUZE/BANDAGES/DRESSINGS) ×2 IMPLANT
DRESSING AQUACEL AG SP 3.5X6 (GAUZE/BANDAGES/DRESSINGS) ×1 IMPLANT
DRSG AQUACEL AG ADV 3.5X 4 (GAUZE/BANDAGES/DRESSINGS) IMPLANT
DRSG AQUACEL AG SP 3.5X4 (GAUZE/BANDAGES/DRESSINGS) ×2
DRSG AQUACEL AG SP 3.5X6 (GAUZE/BANDAGES/DRESSINGS) ×1
DURAPREP 26ML APPLICATOR (WOUND CARE) ×1 IMPLANT
ELECT REM PT RETURN 15FT ADLT (MISCELLANEOUS) ×1 IMPLANT
GAUZE SPONGE 4X4 12PLY STRL (GAUZE/BANDAGES/DRESSINGS) ×1 IMPLANT
GAUZE XEROFORM 1X8 LF (GAUZE/BANDAGES/DRESSINGS) ×1 IMPLANT
GLOVE BIOGEL PI IND STRL 8 (GLOVE) ×1 IMPLANT
GLOVE SURG ORTHO 8.0 STRL STRW (GLOVE) ×1 IMPLANT
GOWN STRL REUS W/ TWL LRG LVL3 (GOWN DISPOSABLE) ×1 IMPLANT
GOWN STRL REUS W/TWL LRG LVL3 (GOWN DISPOSABLE) ×1
GUIDE PIN 3.2X343 (PIN) ×2
GUIDE PIN 3.2X343MM (PIN) ×2
KIT BASIN OR (CUSTOM PROCEDURE TRAY) ×1 IMPLANT
KIT TURNOVER KIT A (KITS) IMPLANT
MANIFOLD NEPTUNE II (INSTRUMENTS) ×1 IMPLANT
MARKER SKIN DUAL TIP RULER LAB (MISCELLANEOUS) ×1 IMPLANT
NAIL LOCK CANN 10X420 125D RT (Miscellaneous) IMPLANT
NS IRRIG 1000ML POUR BTL (IV SOLUTION) ×1 IMPLANT
PACK GENERAL/GYN (CUSTOM PROCEDURE TRAY) ×1 IMPLANT
PIN GUIDE 3.2X343MM (PIN) IMPLANT
SCREW LAG COMPR KIT 90/85 (Screw) IMPLANT
SOL PREP POV-IOD 4OZ 10% (MISCELLANEOUS) ×1 IMPLANT
SPONGE T-LAP 4X18 ~~LOC~~+RFID (SPONGE) ×1 IMPLANT
STAPLER VISISTAT 35W (STAPLE) ×1 IMPLANT
SUT ETHILON 3 0 PS 1 (SUTURE) IMPLANT
SUT VIC AB 1 CT1 27 (SUTURE) ×3
SUT VIC AB 1 CT1 27XBRD ANBCTR (SUTURE) IMPLANT
SUT VIC AB 2-0 CT1 27 (SUTURE) ×2
SUT VIC AB 2-0 CT1 TAPERPNT 27 (SUTURE) ×2 IMPLANT
SUT VICRYL 0 UR6 27IN ABS (SUTURE) ×2 IMPLANT
TOWEL OR 17X26 10 PK STRL BLUE (TOWEL DISPOSABLE) ×1 IMPLANT

## 2022-09-01 NOTE — Progress Notes (Signed)
Upon transferring patient to short stay, was informed by nurse that the surgery was pushed back to 1930 d/t surgeon being at [facility]. She said she was unable to stay down there in short stay for the extra hour. Transported patient back up to room to wait until short stay can receive patient.

## 2022-09-01 NOTE — Consult Note (Signed)
H&P Physician requesting consult: Margie Ege  Chief Complaint: Urinary retention, difficult Foley  History of Present Illness: 71 year old female with dementia, hypertension, hyperlipidemia presented with altered mental status and fall.  She was found to have right hip fracture.  She was also found to have urinary retention with a residual greater than 400.  Foley catheter was attempted multiple times by emergency room staff and unsuccessful.  She is unable to provide any history.  Her son was in the room with her.  Past Medical History:  Diagnosis Date   Dementia (HCC)    Hyperlipidemia    Hypertension    No past surgical history on file.  Home Medications:  (Not in a hospital admission)  Allergies:  Allergies  Allergen Reactions   Cefdinir Diarrhea and Other (See Comments)    Patient states this medication makes her "feel weird," as well as having diarrhea    No family history on file. Social History:  reports that she does not drink alcohol and does not use drugs. No history on file for tobacco use.  ROS: A complete review of systems was performed.  All systems are negative except for pertinent findings as noted. ROS   Physical Exam:  Vital signs in last 24 hours: Temp:  [97.3 F (36.3 C)-98.1 F (36.7 C)] 97.3 F (36.3 C) (10/01 1345) Pulse Rate:  [87-113] 113 (10/01 1500) Resp:  [14-24] 24 (10/01 1500) BP: (62-125)/(41-88) 105/41 (10/01 1500) SpO2:  [92 %-100 %] 100 % (10/01 1500) Weight:  [49.9 kg] 49.9 kg (10/01 4098) General: Elderly female, confused HEENT: Normocephalic, atraumatic Neck: No JVD or lymphadenopathy Cardiovascular: Regular rate and rhythm Lungs: Regular rate and effort Abdomen: Soft, nontender, nondistended, no abdominal masses Back: No CVA tenderness Genitourinary: Patient had some vaginal stenosis.  The urethra was somewhat retracted.  Unable to visualize.  Laboratory Data:  Results for orders placed or performed during the hospital  encounter of 09/01/22 (from the past 24 hour(s))  CBG monitoring, ED     Status: Abnormal   Collection Time: 09/01/22  9:42 AM  Result Value Ref Range   Glucose-Capillary 112 (H) 70 - 99 mg/dL  Comprehensive metabolic panel     Status: Abnormal   Collection Time: 09/01/22  9:43 AM  Result Value Ref Range   Sodium 141 135 - 145 mmol/L   Potassium 3.5 3.5 - 5.1 mmol/L   Chloride 111 98 - 111 mmol/L   CO2 27 22 - 32 mmol/L   Glucose, Bld 114 (H) 70 - 99 mg/dL   BUN 17 8 - 23 mg/dL   Creatinine, Ser 1.19 0.44 - 1.00 mg/dL   Calcium 8.3 (L) 8.9 - 10.3 mg/dL   Total Protein 6.3 (L) 6.5 - 8.1 g/dL   Albumin 3.3 (L) 3.5 - 5.0 g/dL   AST 147 (H) 15 - 41 U/L   ALT 37 0 - 44 U/L   Alkaline Phosphatase 64 38 - 126 U/L   Total Bilirubin 1.1 0.3 - 1.2 mg/dL   GFR, Estimated >82 >95 mL/min   Anion gap 3 (L) 5 - 15  CBC WITH DIFFERENTIAL     Status: Abnormal   Collection Time: 09/01/22  9:43 AM  Result Value Ref Range   WBC 13.4 (H) 4.0 - 10.5 K/uL   RBC 3.08 (L) 3.87 - 5.11 MIL/uL   Hemoglobin 9.1 (L) 12.0 - 15.0 g/dL   HCT 62.1 (L) 30.8 - 65.7 %   MCV 90.6 80.0 - 100.0 fL   MCH 29.5  26.0 - 34.0 pg   MCHC 32.6 30.0 - 36.0 g/dL   RDW 14.2 11.5 - 15.5 %   Platelets 200 150 - 400 K/uL   nRBC 0.0 0.0 - 0.2 %   Neutrophils Relative % 83 %   Neutro Abs 11.2 (H) 1.7 - 7.7 K/uL   Lymphocytes Relative 9 %   Lymphs Abs 1.2 0.7 - 4.0 K/uL   Monocytes Relative 8 %   Monocytes Absolute 1.0 0.1 - 1.0 K/uL   Eosinophils Relative 0 %   Eosinophils Absolute 0.0 0.0 - 0.5 K/uL   Basophils Relative 0 %   Basophils Absolute 0.0 0.0 - 0.1 K/uL   Immature Granulocytes 0 %   Abs Immature Granulocytes 0.05 0.00 - 0.07 K/uL  Protime-INR     Status: None   Collection Time: 09/01/22  2:50 PM  Result Value Ref Range   Prothrombin Time 15.2 11.4 - 15.2 seconds   INR 1.2 0.8 - 1.2  Type and screen [place]     Status: None (Preliminary result)   Collection Time: 09/01/22  2:50 PM   Result Value Ref Range   ABO/RH(D) PENDING    Antibody Screen PENDING    Sample Expiration      09/04/2022,2359 Performed at [facility], [place] [address], [place], [place] [address]   APTT     Status: None   Collection Time: 09/01/22  2:50 PM  Result Value Ref Range   aPTT 30 24 - 36 seconds   No results found for this or any previous visit (from the past 240 hour(s)). Creatinine: Recent Labs    09/01/22 0943  CREATININE 0.79   Procedure: Under sterile conditions, I advanced a 16 French coud catheter into the urethra and into the bladder.  There was return of clear yellow urine.  10 cc of sterile water was instilled into the catheter balloon.  Impression/Assessment:  Urinary retention Difficult Foley catheterization  Plan:  Maintain Foley catheter for 5 days or more.  Preferably once she is able to mobilize.  Marton Redwood, III 09/01/2022, 4:42 PM

## 2022-09-01 NOTE — Progress Notes (Signed)
   09/01/22 1700  Assess: MEWS Score  Temp 97.8 F (36.6 C)  BP (!) 109/94  Pulse Rate (!) 102  Resp 12  Level of Consciousness Alert  SpO2 95 %  O2 Device Room Air  Assess: MEWS Score  MEWS Temp 0  MEWS Systolic 0  MEWS Pulse 1  MEWS RR 1  MEWS LOC 0  MEWS Score 2  MEWS Score Color Yellow  Assess: if the MEWS score is Yellow or Red  Were vital signs taken at a resting state? Yes  Focused Assessment No change from prior assessment  Does the patient meet 2 or more of the SIRS criteria? No  MEWS guidelines implemented *See Row Information* No, previously yellow, continue vital signs every 4 hours (increased HR in ED, patient in pain, awaiting surgery in less than 2 hours)  Assess: SIRS CRITERIA  SIRS Temperature  0  SIRS Pulse 1  SIRS Respirations  0  SIRS WBC 1  SIRS Score Sum  2   Patient yellow MEWS for HR. Just received from ED, was yellow there also. Patient has increased pain, awaiting Ortho surgery in less than 2 hours. Will be on more frequent vitals after to monitor if MEWs continues

## 2022-09-01 NOTE — Anesthesia Preprocedure Evaluation (Addendum)
Anesthesia Evaluation  Patient identified by MRN, date of birth, ID band Patient unresponsive    Reviewed: Allergy & Precautions, NPO status , Patient's Chart, lab work & pertinent test results  History of Anesthesia Complications Negative for: history of anesthetic complications  Airway Mallampati: II  TM Distance: >3 FB Neck ROM: Full    Dental  (+) Teeth Intact, Dental Advisory Given   Pulmonary neg pulmonary ROS,    Pulmonary exam normal breath sounds clear to auscultation       Cardiovascular hypertension,  Rhythm:Regular Rate:Tachycardia     Neuro/Psych PSYCHIATRIC DISORDERS Dementia negative neurological ROS     GI/Hepatic Neg liver ROS, GERD  Medicated,  Endo/Other  negative endocrine ROS  Renal/GU negative Renal ROS     Musculoskeletal negative musculoskeletal ROS (+) RIGHT HIP INTERTROCHANTERIC FRACTURE   Abdominal   Peds  Hematology  (+) Blood dyscrasia (Plt 200k), anemia ,   Anesthesia Other Findings   Reproductive/Obstetrics                           Anesthesia Physical Anesthesia Plan  ASA: 3 and emergent  Anesthesia Plan: General   Post-op Pain Management: Ofirmev IV (intra-op)*   Induction: Intravenous  PONV Risk Score and Plan: 3 and Dexamethasone and Ondansetron  Airway Management Planned: Oral ETT  Additional Equipment:   Intra-op Plan:   Post-operative Plan: Possible Post-op intubation/ventilation  Informed Consent: I have reviewed the patients History and Physical, chart, labs and discussed the procedure including the risks, benefits and alternatives for the proposed anesthesia with the patient or authorized representative who has indicated his/her understanding and acceptance.     Dental advisory given and Consent reviewed with POA  Plan Discussed with: CRNA  Anesthesia Plan Comments: (Phone consent with patient's son.)      Anesthesia Quick  Evaluation

## 2022-09-01 NOTE — Progress Notes (Addendum)
[facility] ED23 [facility]) Hospital Liaison Note   This is a current [facility] hospice patient. Please call with any hospice questions or concerns. Thank you.   Zigmund Gottron, RN [facility] Liaison [phone]

## 2022-09-01 NOTE — ED Triage Notes (Signed)
Pt BIBA from [facility]. Pt had two witnessed mechanical falls over the weekend. This am pt was unable to bear weight on R leg, noticeable swelling on R thigh. Staff are concerned with some AMS, pt not at baseline. Some concern for UTI.  Oriented to self  Pt under care of [facility]  Given 500 NS by EMS  BP: 100/50 HR: 86 SPO2: 97 RA CBG: 147

## 2022-09-01 NOTE — H&P (Signed)
History and Physical    Patient: Nancy [name] ZMO:[id number] DOB: 1951/10/27 DOA: 09/01/2022 DOS: the patient was seen and examined on 09/01/2022 PCP: Pcp, No  Patient coming from: SNF  Chief Complaint:  Chief Complaint  Patient presents with   Fall   Altered Mental Status   Leg Injury   HPI: Nancy [name] is a 71 y.o. female with medical history significant of dementia, HTN, HLD. Presenting with AMS and fall. History is from chart review as there is no family available at this time. She was at [facility] and had a fall a couple of days ago. Apparently she was initially ok. However, it was noted this morning that she was more confused and she was unable to bear weight on the right hip. The facility became concerned and sent her to the ED for evaluation.  Review of Systems: Unable to review all systems due to lack of cooperation from patient. Past Medical History:  Diagnosis Date   Dementia ([place])    Hyperlipidemia    Hypertension    PSHx No past surgical history on file.  Social History:  reports that she does not drink alcohol and does not use drugs. No history on file for tobacco use.  Allergies  Allergen Reactions   Cefdinir Diarrhea    Patient states this medication makes her feel weird as well as having diarrhea    No family history on file.  Prior to Admission medications   Medication Sig Start Date End Date Taking? Authorizing Provider  acetaminophen (TYLENOL) 500 MG tablet Take 500 mg by mouth every 6 (six) hours as needed for mild pain, moderate pain, fever or headache.    [provider]  alum & mag hydroxide-simeth (MAALOX/MYLANTA) 200-200-20 MG/5ML suspension Take 30 mLs by mouth every 6 (six) hours as needed for indigestion or heartburn.    [provider]  amoxicillin-clavulanate (AUGMENTIN) 875-125 MG tablet Take 1 tablet by mouth every 12 (twelve) hours. Last dose 1/10 in evening 12/10/21   Samuella Cota, MD  citalopram (CELEXA)  10 MG tablet Take 10 mg by mouth daily. 07/20/20   [provider]  guaifenesin (ROBITUSSIN) 100 MG/5ML syrup Take 200 mg by mouth 4 (four) times daily as needed for cough.    [provider]  loperamide (IMODIUM) 2 MG capsule Take 2 mg by mouth every 3 (three) hours as needed for diarrhea or loose stools.    [provider]  magnesium hydroxide (MILK OF MAGNESIA) 400 MG/5ML suspension Take 30 mLs by mouth at bedtime as needed for mild constipation.    [provider]  neomycin-bacitracin-polymyxin (NEOSPORIN) [phone] ointment Apply 1 application topically daily as needed (minor skin tears).    [provider]  OLANZapine (ZYPREXA) 10 MG tablet Take 10 mg by mouth daily. 08/17/20   [provider]  PRESCRIPTION MEDICATION Take 1 each by mouth in the morning, at noon, and at bedtime. Mighty Shakes    [provider]    Physical Exam: Vitals:   09/01/22 1300 09/01/22 1315 09/01/22 1330 09/01/22 1345  BP: (!) 82/60 (!) 62/47 112/65   Pulse: (!) 101 96 92   Resp: (!) 21 19 16    Temp:    (!) 97.3 F (36.3 C)  TempSrc:    Oral  SpO2: 99% 100% 95%   Weight:      Height:       General: 71 y.o. female resting in bed in NAD Eyes: PERRL, normal sclera ENMT: Nares patent  w/o discharge, orophaynx clear, dentition normal, ears w/o discharge/lesions/ulcers Neck: Supple, trachea midline Cardiovascular: tachy, +S1, S2, no m/g/r, equal pulses throughout Respiratory: CTABL, no w/r/r, normal WOB GI: BS+, NDNT, no masses noted, no organomegaly noted MSK: No e/c/c; reporting pain in  Neuro: confused, non-cooperative Psyc: disheveled, agitated, confused  Data Reviewed:  Results for orders placed or performed during the hospital encounter of 09/01/22 (from the past 24 hour(s))  CBG monitoring, ED     Status: Abnormal   Collection Time: 09/01/22  9:42 AM  Result Value Ref Range   Glucose-Capillary 112 (H) 70 - 99 mg/dL  Comprehensive  metabolic panel     Status: Abnormal   Collection Time: 09/01/22  9:43 AM  Result Value Ref Range   Sodium 141 135 - 145 mmol/L   Potassium 3.5 3.5 - 5.1 mmol/L   Chloride 111 98 - 111 mmol/L   CO2 27 22 - 32 mmol/L   Glucose, Bld 114 (H) 70 - 99 mg/dL   BUN 17 8 - 23 mg/dL   Creatinine, Ser 9.38 0.44 - 1.00 mg/dL   Calcium 8.3 (L) 8.9 - 10.3 mg/dL   Total Protein 6.3 (L) 6.5 - 8.1 g/dL   Albumin 3.3 (L) 3.5 - 5.0 g/dL   AST 101 (H) 15 - 41 U/L   ALT 37 0 - 44 U/L   Alkaline Phosphatase 64 38 - 126 U/L   Total Bilirubin 1.1 0.3 - 1.2 mg/dL   GFR, Estimated >75 >10 mL/min   Anion gap 3 (L) 5 - 15  CBC WITH DIFFERENTIAL     Status: Abnormal   Collection Time: 09/01/22  9:43 AM  Result Value Ref Range   WBC 13.4 (H) 4.0 - 10.5 K/uL   RBC 3.08 (L) 3.87 - 5.11 MIL/uL   Hemoglobin 9.1 (L) 12.0 - 15.0 g/dL   HCT 25.8 (L) 52.7 - 78.2 %   MCV 90.6 80.0 - 100.0 fL   MCH 29.5 26.0 - 34.0 pg   MCHC 32.6 30.0 - 36.0 g/dL   RDW 42.3 53.6 - 14.4 %   Platelets 200 150 - 400 K/uL   nRBC 0.0 0.0 - 0.2 %   Neutrophils Relative % 83 %   Neutro Abs 11.2 (H) 1.7 - 7.7 K/uL   Lymphocytes Relative 9 %   Lymphs Abs 1.2 0.7 - 4.0 K/uL   Monocytes Relative 8 %   Monocytes Absolute 1.0 0.1 - 1.0 K/uL   Eosinophils Relative 0 %   Eosinophils Absolute 0.0 0.0 - 0.5 K/uL   Basophils Relative 0 %   Basophils Absolute 0.0 0.0 - 0.1 K/uL   Immature Granulocytes 0 %   Abs Immature Granulocytes 0.05 0.00 - 0.07 K/uL   XR Right Hip Acute intertrochanteric proximal right femoral fracture with mild displacement, moderate varus angulation, and moderate to high-grade fracture impaction.  XR Left Elbow No acute osseous abnormality.  CTH CT c-spine No evidence of intracranial or cervical spine injury.  CXR: 1. Improved aeration of the left lower lung, now with likely mild linear atelectasis and scarring. There appears to be resolution of the prior pneumonia seen on 12/04/2021. 2. New minimal  angulation of the posterior right fourth through seventh ribs, age indeterminate fractures. There may be mild healing callus formation at these fractures, and these are favored to be subacute or older. Recommend clinical correlation. No pneumothorax.  XR Right Shoulder 1. Interval healing of the remote nondisplaced greater tuberosity and mildly displaced surgical neck fractures. 2. No  definite acute fracture is seen. 3. Minimally angulated posterior 4th through seventh rib fractures with mild healing callus formation, subacute to chronic.  Assessment and Plan: Right hip fracture Fall     - admit to inpt, tele     - Orthopedics to take to OR this evening, appreciate assistance     - pain control  Urinary retention     - place foley  Acute on chronic metabolic encephalopathy Dementia     - has some urinary retention and leukocytosis; likely with UTI; UA and UCx are pending     - continue home dementia regimen when confirmed  SIRS     - given mental status change and dysuria, she likely has a UTI     - check UA, Ucx     - check lactic acid     - can empirically cover with rocephin (spoke with pharm about her allergy)  Elevated LFTs     - check hepatitis panel     - fluids, trend  Normocytic anemia     - no evidence of bleed     - check TIBC  Advance Care Planning:   Code Status: FULL  Consults: Orthopedics (Dr. August Saucer)  Family Communication: None at bedside  Severity of Illness: The appropriate patient status for this patient is INPATIENT. Inpatient status is judged to be reasonable and necessary in order to provide the required intensity of service to ensure the patient's safety. The patient's presenting symptoms, physical exam findings, and initial radiographic and laboratory data in the context of their chronic comorbidities is felt to place them at high risk for further clinical deterioration. Furthermore, it is not anticipated that the patient will be medically stable  for discharge from the hospital within 2 midnights of admission.   * I certify that at the point of admission it is my clinical judgment that the patient will require inpatient hospital care spanning beyond 2 midnights from the point of admission due to high intensity of service, high risk for further deterioration and high frequency of surveillance required.*  Time spent in coordination of this H&P: 40 minutes  Author: Teddy Spike, DO 09/01/2022 1:59 PM  For on call review [email or web address].

## 2022-09-01 NOTE — Anesthesia Procedure Notes (Signed)
Procedure Name: Intubation Date/Time: 09/01/2022 8:49 PM  Performed by: Maxwell Caul, CRNAPre-anesthesia Checklist: Patient identified, Emergency Drugs available, Suction available and Patient being monitored Patient Re-evaluated:Patient Re-evaluated prior to induction Oxygen Delivery Method: Circle system utilized Preoxygenation: Pre-oxygenation with 100% oxygen Induction Type: IV induction Ventilation: Mask ventilation without difficulty Laryngoscope Size: Mac and 4 Grade View: Grade I Tube type: Oral Tube size: 7.0 mm Number of attempts: 1 Airway Equipment and Method: Stylet Placement Confirmation: ETT inserted through vocal cords under direct vision, positive ETCO2 and breath sounds checked- equal and bilateral Secured at: 22 cm Tube secured with: Tape Dental Injury: Teeth and Oropharynx as per pre-operative assessment

## 2022-09-01 NOTE — Plan of Care (Signed)
  Problem: Education: Goal: Knowledge of General Education information will improve Description: Including pain rating scale, medication(s)/side effects and non-pharmacologic comfort measures Outcome: Progressing   Problem: Pain Managment: Goal: General experience of comfort will improve Outcome: Progressing   Problem: Safety: Goal: Ability to remain free from injury will improve Outcome: Progressing   

## 2022-09-01 NOTE — ED Provider Notes (Cosign Needed Addendum)
[facility] DEPT Provider Note   CSN: [id number] Arrival date & time: 09/01/22  0915     History  Chief Complaint  Patient presents with   Fall   Altered Mental Status   Leg Injury    Nancy [name] is a 71 y.o. female with a past medical history of dementia hypertension and hyperlipidemia presenting today from [facility] in memory care after a fall.  She reportedly fell on 9/29 and had some injury to the right hip.  She was not transferred to the emergency department until this morning.  They noted that she was unable to bear weight on the right hip and seemed more confused than baseline.  Patient history level 5 caveat.    Fall  Altered Mental Status      Home Medications Prior to Admission medications   Medication Sig Start Date End Date Taking? Authorizing Provider  acetaminophen (TYLENOL) 500 MG tablet Take 500 mg by mouth every 6 (six) hours as needed for mild pain, moderate pain, fever or headache.    [provider]  alum & mag hydroxide-simeth (MAALOX/MYLANTA) 200-200-20 MG/5ML suspension Take 30 mLs by mouth every 6 (six) hours as needed for indigestion or heartburn.    [provider]  amoxicillin-clavulanate (AUGMENTIN) 875-125 MG tablet Take 1 tablet by mouth every 12 (twelve) hours. Last dose 1/10 in evening 12/10/21   Samuella Cota, MD  citalopram (CELEXA) 10 MG tablet Take 10 mg by mouth daily. 07/20/20   [provider]  guaifenesin (ROBITUSSIN) 100 MG/5ML syrup Take 200 mg by mouth 4 (four) times daily as needed for cough.    [provider]  loperamide (IMODIUM) 2 MG capsule Take 2 mg by mouth every 3 (three) hours as needed for diarrhea or loose stools.    [provider]  magnesium hydroxide (MILK OF MAGNESIA) 400 MG/5ML suspension Take 30 mLs by mouth at bedtime as needed for mild constipation.    [provider]  neomycin-bacitracin-polymyxin (NEOSPORIN) [phone]  ointment Apply 1 application topically daily as needed (minor skin tears).    [provider]  OLANZapine (ZYPREXA) 10 MG tablet Take 10 mg by mouth daily. 08/17/20   [provider]  PRESCRIPTION MEDICATION Take 1 each by mouth in the morning, at noon, and at bedtime. Mighty Shakes    [provider]      Allergies    Cefdinir    Review of Systems   Review of Systems  Physical Exam Updated Vital Signs BP 121/67 (BP Location: Right Arm)   Pulse 87   Temp 98.1 F (36.7 C) (Oral)   Resp 16   Ht 5\' 7"  (1.702 m)   Wt 49.9 kg   LMP  (LMP Unknown)   SpO2 98%   BMI 17.23 kg/m  Physical Exam Vitals and nursing note reviewed.  Constitutional:      Appearance: Normal appearance.  HENT:     Head: Normocephalic.     Comments: Superficial abrasion with mild swelling to the left brow Eyes:     General: No scleral icterus.    Extraocular Movements: Extraocular movements intact.     Conjunctiva/sclera: Conjunctivae normal.     Pupils: Pupils are equal, round, and reactive to light.  Pulmonary:     Effort: Pulmonary effort is normal. No respiratory distress.  Abdominal:     General: Abdomen is flat.     Palpations: Abdomen is soft.     Tenderness: There is no abdominal tenderness.  Comments: No bruising or trauma noted.  Nontender to palpation  Musculoskeletal:        General: Swelling, tenderness, deformity and signs of injury present.     Cervical back: Normal range of motion. No tenderness.     Comments: Shortened and externally rotated right lower extremity.  Unable to flex or abduct rt hip.  Full range of motion of the left upper extremity and left lower extremity.  Limited flexion of the right shoulder.  Skin:    Findings: No rash.  Neurological:     Mental Status: She is alert.     Comments: Following commands.  PERRLA, EOMs intact.  Normal strength in left upper and left lower extremity  Psychiatric:        Mood and Affect: Mood normal.      ED Results / Procedures / Treatments   Labs (all labs ordered are listed, but only abnormal results are displayed) Labs Reviewed - No data to display  EKG None  Radiology No results found.  Procedures .Critical Care  Performed by: Saddie Benders, PA-C Authorized by: Saddie Benders, PA-C   Critical care provider statement:    Critical care time (minutes):  30   Critical care was necessary to treat or prevent imminent or life-threatening deterioration of the following conditions:  Trauma   Critical care was time spent personally by me on the following activities:  Development of treatment plan with patient or surrogate, discussions with consultants, discussions with primary provider, obtaining history from patient or surrogate, evaluation of patient's response to treatment, ordering and review of laboratory studies, ordering and review of radiographic studies, pulse oximetry, re-evaluation of patient's condition and review of old charts   I assumed direction of critical care for this patient from another provider in my specialty: yes     Care discussed with: admitting provider   Comments:     Multiple lengthy conversations with orthopedic surgery and patient's son. Also had to speak with living facility and [facility] twice.     Medications Ordered in ED Medications - No data to display  ED Course/ Medical Decision Making/ A&P Clinical Course as of 09/01/22 1217  Sun Sep 01, 2022  1053 I spoke with patient's facility who report that on Friday she had 2 falls.  Both of these were witnessed.  Patient reportedly first fell onto her right side striking her elbow and hip.  About 15 minutes later she fell onto her left side and hit her head on the floor.  Sustained an abrasion to her brow as well as an abrasion to her left elbow.  The hospice nurse came out to see the patient and decided that she did not need transport to the emergency department.  Yesterday patient spent the  majority of the day in a wheelchair but was able to ambulate.  The nurse reports that she believes the patient had an unwitnessed fall between 3 PM on Friday and 7 AM on Saturday because when she came back to work she noted a deformity in patient's right hip.  They also report that they are never able to get in touch with the patient's son [MR]  1216 I was able to get in touch with the patient's son who says that he is likely to consent for patient's surgery but would like to speak with orthopedics first.  Sent secure chat to [place], [place] and Dr. August Saucer who will try and get in touch with the patient.  Medical admission has been  paged. [MR]    Clinical Course User Index [MR] Suhaila Troiano, Gabriel Cirri, PA-C                           Medical Decision Making Amount and/or Complexity of Data Reviewed Labs: ordered. Radiology: ordered.  Risk Prescription drug management. Decision regarding hospitalization.   This is a 71 year old female who presents after a fall 2 days ago.  Also reportedly more confused than baseline.  Differential includes but is not limited to intracranial hemorrhage, worsening dementia, brain mass, fractures and dislocations.     This is not an exhaustive differential.    Past Medical History / Co-morbidities / Social History: Dementia and hyperlipidemia   Additional history: Per review of patient's facility records she currently takes Tylenol as needed, daily citalopram, loperamide as needed, olanzapine daily and has been diagnosed with dementia with occasional mood disturbance.  Per paperwork and patient's facility patient is a full code.   Physical Exam: Pertinent physical exam findings include Shortened and externally rotated right hip.  Pulses intact Small abrasion and swelling to the left brow Following commands, alert to self only  Lab Tests: I ordered, and personally interpreted labs.  The pertinent results include: Hgb 9.1 down from 11.1 8 mos ago   Imaging  Studies: I ordered and independently visualized and interpreted the following Right hip x-ray which reveals a right proximal femoral intertrochanteric fracture.  I agree with the radiologist Chest x-ray which shows indeterminate age rib fractures.  Patient has no tenderness over these fractures, I suspect these are old CT head and neck which are unremarkable Left hip and left elbow radiographs negative I agree with the radiologist on all of their reads  Medications: I ordered medication including fentanyl. Reevaluation of the patient after these medicines showed that the patient improved.  Sleeping comfortably   Consultations Obtained: I spoke with the PA with orthopedics on-call who says that he will see the patient in the emergency department.  PA-C [facility] and Dr. August Saucer to operate around 6pm.   MDM/Disposition: This is a 71 year old female presenting today after 2 witnessed and likely 1 unwitnessed fall.  On physical exam she has a shortened and externally rotated right lower extremity.  Neurovascularly intact.  X-ray revealing of a right hip fracture.  Orthopedics came and saw the patient and request medicine admission for operation likely tomorrow.   Patient's son Iyesha Such spoke with Dr. August Saucer about the risks versus benefits of surgery.  He ultimately agreed for the procedure.  Consent form filled out by this provider as patient's son is not physically in the department.  Orthopedic surgery was a team that discussed benefits and risks and I subsequently filled out patient's consent with her son.  Form is at bedside.  I discussed this case with my attending physician Dr. Rubin Payor who cosigned this note including patient's presenting symptoms, physical exam, and planned diagnostics and interventions. Attending physician stated agreement with plan or made changes to plan which were implemented.      Final Clinical Impression(s) / ED Diagnoses Final diagnoses:  Closed displaced  intertrochanteric fracture of right femur, initial encounter [facility])    Rx / DC Orders   Admit to Dr. Lutricia Feil, [place] A, PA-C 09/01/22 1447    Benjiman Core, MD 09/01/22 1459   Patient's son is now at bedside.  Consent was redone with his signature.  Dr. August Saucer and anesthesiology will sign their portion prior to  surgery.   Woodroe Chen 09/01/22 1527    Benjiman Core, MD 09/02/22 1452

## 2022-09-01 NOTE — Transfer of Care (Signed)
Immediate Anesthesia Transfer of Care Note  Patient: Nancy [name]  Procedure(s) Performed: INTRAMEDULLARY (IM) NAIL INTERTROCHANTERIC (Right)  Patient Location: PACU  Anesthesia Type:General  Level of Consciousness: awake, alert  and oriented  Airway & Oxygen Therapy: Patient Spontanous Breathing and Patient connected to face mask oxygen  Post-op Assessment: Report given to RN and Post -op Vital signs reviewed and stable  Post vital signs: Reviewed and stable  Last Vitals:  Vitals Value Taken Time  BP 109/53 09/01/22 2348  Temp 36.4 C 09/01/22 2348  Pulse 103 09/01/22 2348  Resp 18 09/01/22 2348  SpO2 98 % 09/01/22 2348    Last Pain:  Vitals:   09/01/22 2348  TempSrc: Oral  PainSc:          Complications: No notable events documented.

## 2022-09-01 NOTE — Consult Note (Signed)
Reason for Consult: Right hip pain Referring Physician: Dr. Maia Plan Nancy [name] is an 71 y.o. female.  HPI: Nancy [name] is an ambulatory 71 year old patient with dementia who sustained mechanical fall earlier.  She has displaced right hip intertrochanteric fracture by radiographic evaluation.  Although the patient does have dementia and she is very active in terms of ambulating in her place of residence.  Past Medical History:  Diagnosis Date   Dementia ([place])    Hyperlipidemia    Hypertension     History reviewed. No pertinent surgical history.  History reviewed. No pertinent family history.  Social History:  reports that she does not drink alcohol and does not use drugs. No history on file for tobacco use.  Allergies:  Allergies  Allergen Reactions   Cefdinir Diarrhea and Other (See Comments)    Patient states this medication makes her "feel weird," as well as having diarrhea    Medications: I have reviewed the patient's current medications.  Results for orders placed or performed during the hospital encounter of 09/01/22 (from the past 48 hour(s))  CBG monitoring, ED     Status: Abnormal   Collection Time: 09/01/22  9:42 AM  Result Value Ref Range   Glucose-Capillary 112 (H) 70 - 99 mg/dL    Comment: Glucose reference range applies only to samples taken after fasting for at least 8 hours.  Comprehensive metabolic panel     Status: Abnormal   Collection Time: 09/01/22  9:43 AM  Result Value Ref Range   Sodium 141 135 - 145 mmol/L   Potassium 3.5 3.5 - 5.1 mmol/L   Chloride 111 98 - 111 mmol/L   CO2 27 22 - 32 mmol/L   Glucose, Bld 114 (H) 70 - 99 mg/dL    Comment: Glucose reference range applies only to samples taken after fasting for at least 8 hours.   BUN 17 8 - 23 mg/dL   Creatinine, Ser 0.79 0.44 - 1.00 mg/dL   Calcium 8.3 (L) 8.9 - 10.3 mg/dL   Total Protein 6.3 (L) 6.5 - 8.1 g/dL   Albumin 3.3 (L) 3.5 - 5.0 g/dL   AST 112 (H) 15 - 41 U/L   ALT 37 0 - 44 U/L    Alkaline Phosphatase 64 38 - 126 U/L   Total Bilirubin 1.1 0.3 - 1.2 mg/dL   GFR, Estimated >60 >60 mL/min    Comment: (NOTE) Calculated using the CKD-EPI Creatinine Equation (2021)    Anion gap 3 (L) 5 - 15    Comment: Performed at [facility], [place] [address] [place], [place] [address]  CBC WITH DIFFERENTIAL     Status: Abnormal   Collection Time: 09/01/22  9:43 AM  Result Value Ref Range   WBC 13.4 (H) 4.0 - 10.5 K/uL   RBC 3.08 (L) 3.87 - 5.11 MIL/uL   Hemoglobin 9.1 (L) 12.0 - 15.0 g/dL   HCT 27.9 (L) 36.0 - 46.0 %   MCV 90.6 80.0 - 100.0 fL   MCH 29.5 26.0 - 34.0 pg   MCHC 32.6 30.0 - 36.0 g/dL   RDW 14.2 11.5 - 15.5 %   Platelets 200 150 - 400 K/uL   nRBC 0.0 0.0 - 0.2 %   Neutrophils Relative % 83 %   Neutro Abs 11.2 (H) 1.7 - 7.7 K/uL   Lymphocytes Relative 9 %   Lymphs Abs 1.2 0.7 - 4.0 K/uL   Monocytes Relative 8 %   Monocytes Absolute 1.0 0.1 - 1.0 K/uL  Eosinophils Relative 0 %   Eosinophils Absolute 0.0 0.0 - 0.5 K/uL   Basophils Relative 0 %   Basophils Absolute 0.0 0.0 - 0.1 K/uL   Immature Granulocytes 0 %   Abs Immature Granulocytes 0.05 0.00 - 0.07 K/uL    Comment: Performed at [facility], [place] [address] [place], [place] [address]  ABO/Rh     Status: None   Collection Time: 09/01/22  2:32 PM  Result Value Ref Range   ABO/RH(D)      A POS Performed at [facility], [place] [address] [place], [place] [address]   Protime-INR     Status: None   Collection Time: 09/01/22  2:50 PM  Result Value Ref Range   Prothrombin Time 15.2 11.4 - 15.2 seconds   INR 1.2 0.8 - 1.2    Comment: (NOTE) INR goal varies based on device and disease states. Performed at [facility], [place] [address], [place], [place] [address]   Type and screen [place]     Status: None   Collection Time: 09/01/22  2:50 PM  Result Value Ref Range   ABO/RH(D) A POS    Antibody Screen NEG     Sample Expiration      09/04/2022,2359 Performed at [facility], [place] [address], [place], [place] [address]   APTT     Status: None   Collection Time: 09/01/22  2:50 PM  Result Value Ref Range   aPTT 30 24 - 36 seconds    Comment: Performed at [facility], [place] [address], [place], [place] [address]  Lactic acid, plasma     Status: None   Collection Time: 09/01/22  4:20 PM  Result Value Ref Range   Lactic Acid, Venous 0.9 0.5 - 1.9 mmol/L    Comment: Performed at [facility], [place] [address] [place], [place] [address]  Urinalysis, Routine w reflex microscopic     Status: Abnormal   Collection Time: 09/01/22  4:36 PM  Result Value Ref Range   Color, Urine YELLOW YELLOW   APPearance CLEAR CLEAR   Specific Gravity, Urine 1.025 1.005 - 1.030   pH 5.0 5.0 - 8.0   Glucose, UA NEGATIVE NEGATIVE mg/dL   Hgb urine dipstick MODERATE (A) NEGATIVE   Bilirubin Urine NEGATIVE NEGATIVE   Ketones, ur NEGATIVE NEGATIVE mg/dL   Protein, ur 30 (A) NEGATIVE mg/dL   Nitrite NEGATIVE NEGATIVE   Leukocytes,Ua NEGATIVE NEGATIVE   RBC / HPF 0-5 0 - 5 RBC/hpf   WBC, UA 0-5 0 - 5 WBC/hpf   Bacteria, UA NONE SEEN NONE SEEN    Comment: Performed at [facility], [place] [address], [place], [place] [address]    DG Elbow Complete Left  Result Date: 09/01/2022 CLINICAL DATA:  Fall earlier today. EXAM: LEFT ELBOW - COMPLETE 3+ VIEW COMPARISON:  None Available. FINDINGS: There is no evidence of fracture, dislocation, or joint effusion. There is no evidence of focal bone abnormality. Mild osteoarthritis of the ulnohumeral joint. Soft tissues are unremarkable. IMPRESSION: No acute osseous abnormality. Electronically Signed   By: Ileana Roup M.D.   On: 09/01/2022 11:39   DG Hip Unilat W or Wo Pelvis 2-3 Views Left  Result Date: 09/01/2022 CLINICAL DATA:  Fall earlier today. EXAM: DG HIP (WITH OR WITHOUT PELVIS) 2V LEFT COMPARISON:   Pelvis radiograph from earlier today. FINDINGS: There is no evidence of hip fracture or dislocation. There is  no evidence of arthropathy or other focal bone abnormality. Osteopenia. IMPRESSION: Negative left hip. Electronically Signed   By: Tiburcio Pea M.D.   On: 09/01/2022 11:29   CT Head Wo Contrast  Result Date: 09/01/2022 CLINICAL DATA:  Head injury.  Fall. EXAM: CT HEAD WITHOUT CONTRAST CT CERVICAL SPINE WITHOUT CONTRAST TECHNIQUE: Multidetector CT imaging of the head and cervical spine was performed following the standard protocol without intravenous contrast. Multiplanar CT image reconstructions of the cervical spine were also generated. RADIATION DOSE REDUCTION: This exam was performed according to the departmental dose-optimization program which includes automated exposure control, adjustment of the mA and/or kV according to patient size and/or use of iterative reconstruction technique. COMPARISON:  01/13/2022 FINDINGS: CT HEAD FINDINGS Brain: No evidence of acute infarction, hemorrhage, hydrocephalus, extra-axial collection or mass lesion/mass effect. Mild cerebral volume loss and chronic small vessel ischemia. Vascular: No hyperdense vessel or unexpected calcification. Skull: Normal. Negative for fracture or focal lesion. Sinuses/Orbits: No evidence of injury. CT CERVICAL SPINE FINDINGS Alignment: Normal. Skull base and vertebrae: No acute fracture. No primary bone lesion or focal pathologic process. Soft tissues and spinal canal: No prevertebral fluid or swelling. No visible canal hematoma. Disc levels:  Ordinary degenerative changes. Upper chest: No evidence of injury IMPRESSION: No evidence of intracranial or cervical spine injury. Electronically Signed   By: Tiburcio Pea M.D.   On: 09/01/2022 10:48   CT Cervical Spine Wo Contrast  Result Date: 09/01/2022 CLINICAL DATA:  Head injury.  Fall. EXAM: CT HEAD WITHOUT CONTRAST CT CERVICAL SPINE WITHOUT CONTRAST TECHNIQUE: Multidetector CT  imaging of the head and cervical spine was performed following the standard protocol without intravenous contrast. Multiplanar CT image reconstructions of the cervical spine were also generated. RADIATION DOSE REDUCTION: This exam was performed according to the departmental dose-optimization program which includes automated exposure control, adjustment of the mA and/or kV according to patient size and/or use of iterative reconstruction technique. COMPARISON:  01/13/2022 FINDINGS: CT HEAD FINDINGS Brain: No evidence of acute infarction, hemorrhage, hydrocephalus, extra-axial collection or mass lesion/mass effect. Mild cerebral volume loss and chronic small vessel ischemia. Vascular: No hyperdense vessel or unexpected calcification. Skull: Normal. Negative for fracture or focal lesion. Sinuses/Orbits: No evidence of injury. CT CERVICAL SPINE FINDINGS Alignment: Normal. Skull base and vertebrae: No acute fracture. No primary bone lesion or focal pathologic process. Soft tissues and spinal canal: No prevertebral fluid or swelling. No visible canal hematoma. Disc levels:  Ordinary degenerative changes. Upper chest: No evidence of injury IMPRESSION: No evidence of intracranial or cervical spine injury. Electronically Signed   By: Tiburcio Pea M.D.   On: 09/01/2022 10:48   DG Chest 1 View  Result Date: 09/01/2022 CLINICAL DATA:  Fall.  Right hip fracture. EXAM: CHEST  1 VIEW COMPARISON:  AP chest 12/04/2021, chest two views 11/29/2021; CTA chest 12/04/2021 FINDINGS: There is improved aeration of the left lower lung, now with mild linear likely atelectasis and scarring where previously there was higher density heterogeneous airspace opacification on 11/29/2021 and 12/04/2021 radiographs and 12/04/2021 CT. The right lung is clear. No pleural effusion or pneumothorax. Cardiac silhouette and mediastinal contours within normal limits. Mild-to-moderate calcification within the aortic arch. There is new minimal angulation  of the posterior right fourth through seventh ribs, age indeterminate fractures. There may be mild healing callus formation in this region and these are favored to be acute to subacute. IMPRESSION: 1. Improved aeration of the left lower lung, now with likely mild linear atelectasis and scarring. There  appears to be resolution of the prior pneumonia seen on 12/04/2021. 2. New minimal angulation of the posterior right fourth through seventh ribs, age indeterminate fractures. There may be mild healing callus formation at these fractures, and these are favored to be subacute or older. Recommend clinical correlation. No pneumothorax. Electronically Signed   By: Yvonne Kendall M.D.   On: 09/01/2022 10:44   DG Hip Unilat W or Wo Pelvis 2-3 Views Right  Result Date: 09/01/2022 CLINICAL DATA:  Fall.  Right hip pain. EXAM: DG HIP (WITH OR WITHOUT PELVIS) 2-3V RIGHT; RIGHT FEMUR 2 VIEWS COMPARISON:  AP pelvis 08/30/2020 FINDINGS: There is an acute fracture of the right greater trochanter extending through the intertrochanteric region and through the lesser trochanter. Mild superior and medial displacement of lesser trochanter. Moderate varus angulation. Moderate high-grade fracture impaction. The right femoral head appears normally located with respect to the right acetabulum. Minimal superior left femoroacetabular joint space narrowing. Minimal superolateral left acetabular degenerative osteophytosis. Joint spaces are preserved. No acute fracture is seen within the more distal aspect of the right femur. Moderate right knee osteoarthritis. IMPRESSION: Acute intertrochanteric proximal right femoral fracture with mild displacement, moderate varus angulation, and moderate to high-grade fracture impaction. Electronically Signed   By: Yvonne Kendall M.D.   On: 09/01/2022 10:39   DG Femur Min 2 Views Right  Result Date: 09/01/2022 CLINICAL DATA:  Fall.  Right hip pain. EXAM: DG HIP (WITH OR WITHOUT PELVIS) 2-3V RIGHT; RIGHT  FEMUR 2 VIEWS COMPARISON:  AP pelvis 08/30/2020 FINDINGS: There is an acute fracture of the right greater trochanter extending through the intertrochanteric region and through the lesser trochanter. Mild superior and medial displacement of lesser trochanter. Moderate varus angulation. Moderate high-grade fracture impaction. The right femoral head appears normally located with respect to the right acetabulum. Minimal superior left femoroacetabular joint space narrowing. Minimal superolateral left acetabular degenerative osteophytosis. Joint spaces are preserved. No acute fracture is seen within the more distal aspect of the right femur. Moderate right knee osteoarthritis. IMPRESSION: Acute intertrochanteric proximal right femoral fracture with mild displacement, moderate varus angulation, and moderate to high-grade fracture impaction. Electronically Signed   By: Yvonne Kendall M.D.   On: 09/01/2022 10:39   DG Shoulder Right  Result Date: 09/01/2022 CLINICAL DATA:  Fall. EXAM: RIGHT SHOULDER - 2+ VIEW COMPARISON:  Right shoulder and humerus radiographs 08/30/2020 FINDINGS: Interval healing of the prior remote nondisplaced greater tuberosity and mildly displaced surgical neck fractures. There is diffuse decreased bone mineralization. There are some lucencies in the region of the prior fracture lines, however the margins appear corticated and these are favored to represent the patient's now healed fracture. No definite acute fracture is seen. Mild glenohumeral joint space narrowing. Minimally angulated posterior 4th through seventh rib fractures with mild healing callus formation, subacute to chronic. IMPRESSION: 1. Interval healing of the remote nondisplaced greater tuberosity and mildly displaced surgical neck fractures. 2. No definite acute fracture is seen. 3. Minimally angulated posterior 4th through seventh rib fractures with mild healing callus formation, subacute to chronic. Electronically Signed   By: Yvonne Kendall M.D.   On: 09/01/2022 10:34    Review of Systems  Unable to perform ROS: Dementia   Blood pressure 99/85, pulse (!) 114, temperature 99.6 F (37.6 C), resp. rate 19, height 5\' 7"  (1.702 m), weight 50.2 kg, SpO2 98 %. Physical Exam Vitals reviewed.  HENT:     Head: Normocephalic.     Nose: Nose normal.  Mouth/Throat:     Mouth: Mucous membranes are moist.  Eyes:     Pupils: Pupils are equal, round, and reactive to light.  Cardiovascular:     Rate and Rhythm: Normal rate.     Pulses: Normal pulses.  Pulmonary:     Effort: Pulmonary effort is normal.  Abdominal:     General: Abdomen is flat.  Musculoskeletal:     Cervical back: Normal range of motion.  Skin:    General: Skin is warm.     Capillary Refill: Capillary refill takes less than 2 seconds.  Neurological:     Mental Status: She is alert.   Bilateral upper extremities have reasonable range of motion of the wrist shoulders and elbows.  Has a bit of bruising around the elbow but no crepitus with range of motion including flexion extension pronation supination of the elbow.  Patient has no pain with range of motion of the left knee hip and ankle.  Does have a small abrasion and some ecchymosis surrounding the left proximal tibial region but no knee effusion and stable collateral and cruciate ligaments.  Right leg has pain with range of motion and some shortening.  Pedal pulses intact and ankle dorsiflexion intact bilaterally  Assessment/Plan: Impression is right hip fracture in a demented patient who is ambulatory.  Discussed operative and nonoperative treatment options with her son.  They include but not limited to observation which would really be very difficult for the patient in terms of pain control and being able to ambulate.  Operative fixation carries the expected relatively high 25-month mortality risk for patients in her age group with dementia.  Also there is a chance of hardware failure nonunion malunion as  well as screw cut out depending on bone quality.  Nonetheless I think that does give this ambulatory patient the best chance that getting around.  Her son agrees.  All questions answered.  Landry Dyke Arlita Buffkin 09/01/2022, 8:40 PM

## 2022-09-01 NOTE — Progress Notes (Signed)
Pt tried pull IV and monitor out, applied mittens on bilateral hands. Unable to communication. Open eyes one time when touch and called her name,

## 2022-09-01 NOTE — ED Notes (Addendum)
Pt is asleep, responsive to stimuli.

## 2022-09-01 NOTE — Progress Notes (Signed)
Rec'd patient to floor from ED, Short stay called (5 min after)  to get report and have patient transported there.  Completed as much of the admission as possible prior to bringing patient down.

## 2022-09-02 ENCOUNTER — Encounter (HOSPITAL_COMMUNITY): Payer: Self-pay | Admitting: Orthopedic Surgery

## 2022-09-02 ENCOUNTER — Inpatient Hospital Stay (HOSPITAL_COMMUNITY)

## 2022-09-02 DIAGNOSIS — F03C4 Unspecified dementia, severe, with anxiety: Secondary | ICD-10-CM

## 2022-09-02 DIAGNOSIS — R338 Other retention of urine: Secondary | ICD-10-CM

## 2022-09-02 DIAGNOSIS — G9341 Metabolic encephalopathy: Secondary | ICD-10-CM

## 2022-09-02 DIAGNOSIS — I10 Essential (primary) hypertension: Secondary | ICD-10-CM

## 2022-09-02 DIAGNOSIS — E43 Unspecified severe protein-calorie malnutrition: Secondary | ICD-10-CM | POA: Insufficient documentation

## 2022-09-02 DIAGNOSIS — D649 Anemia, unspecified: Secondary | ICD-10-CM

## 2022-09-02 DIAGNOSIS — R7989 Other specified abnormal findings of blood chemistry: Secondary | ICD-10-CM

## 2022-09-02 DIAGNOSIS — E785 Hyperlipidemia, unspecified: Secondary | ICD-10-CM

## 2022-09-02 DIAGNOSIS — S72001A Fracture of unspecified part of neck of right femur, initial encounter for closed fracture: Secondary | ICD-10-CM | POA: Diagnosis not present

## 2022-09-02 DIAGNOSIS — R6510 Systemic inflammatory response syndrome (SIRS) of non-infectious origin without acute organ dysfunction: Secondary | ICD-10-CM

## 2022-09-02 LAB — IRON AND TIBC
Iron: 10 ug/dL — ABNORMAL LOW (ref 28–170)
Saturation Ratios: 4 % — ABNORMAL LOW (ref 10.4–31.8)
TIBC: 228 ug/dL — ABNORMAL LOW (ref 250–450)
UIBC: 218 ug/dL

## 2022-09-02 LAB — CBC
HCT: 26 % — ABNORMAL LOW (ref 36.0–46.0)
Hemoglobin: 8.3 g/dL — ABNORMAL LOW (ref 12.0–15.0)
MCH: 29.6 pg (ref 26.0–34.0)
MCHC: 31.9 g/dL (ref 30.0–36.0)
MCV: 92.9 fL (ref 80.0–100.0)
Platelets: 178 10*3/uL (ref 150–400)
RBC: 2.8 MIL/uL — ABNORMAL LOW (ref 3.87–5.11)
RDW: 14.3 % (ref 11.5–15.5)
WBC: 12.4 10*3/uL — ABNORMAL HIGH (ref 4.0–10.5)
nRBC: 0 % (ref 0.0–0.2)

## 2022-09-02 LAB — BASIC METABOLIC PANEL
Anion gap: 5 (ref 5–15)
BUN: 17 mg/dL (ref 8–23)
CO2: 24 mmol/L (ref 22–32)
Calcium: 7.9 mg/dL — ABNORMAL LOW (ref 8.9–10.3)
Chloride: 112 mmol/L — ABNORMAL HIGH (ref 98–111)
Creatinine, Ser: 0.77 mg/dL (ref 0.44–1.00)
GFR, Estimated: >60 mL/min (ref >60)
Glucose, Bld: 137 mg/dL — ABNORMAL HIGH (ref 70–99)
Potassium: 4.5 mmol/L (ref 3.5–5.1)
Sodium: 141 mmol/L (ref 135–145)

## 2022-09-02 LAB — URINE CULTURE: Culture: <10000 — AB

## 2022-09-02 LAB — VITAMIN D 25 HYDROXY (VIT D DEFICIENCY, FRACTURES): Vit D, 25-Hydroxy: 22.38 ng/mL — ABNORMAL LOW (ref 30–100)

## 2022-09-02 MED ORDER — OXYCODONE HCL 5 MG PO TABS
5.0000 mg | ORAL_TABLET | Freq: Four times a day (QID) | ORAL | Status: DC | PRN
  Administered 2022-09-03 – 2022-09-05 (×5): 5 mg via ORAL
  Filled 2022-09-02 (×5): qty 1

## 2022-09-02 MED ORDER — OLANZAPINE 10 MG PO TABS
10.0000 mg | ORAL_TABLET | Freq: Every day | ORAL | Status: DC
  Administered 2022-09-02 – 2022-09-05 (×4): 10 mg via ORAL
  Filled 2022-09-02 (×5): qty 1

## 2022-09-02 MED ORDER — SODIUM CHLORIDE 0.9 % IV BOLUS
500.0000 mL | Freq: Once | INTRAVENOUS | Status: AC
  Administered 2022-09-02: 500 mL via INTRAVENOUS

## 2022-09-02 MED ORDER — ENSURE ENLIVE PO LIQD
237.0000 mL | Freq: Two times a day (BID) | ORAL | Status: DC
  Administered 2022-09-02 – 2022-09-06 (×10): 237 mL via ORAL

## 2022-09-02 MED ORDER — CHLORHEXIDINE GLUCONATE CLOTH 2 % EX PADS
6.0000 | MEDICATED_PAD | Freq: Every day | CUTANEOUS | Status: DC
  Administered 2022-09-02 – 2022-09-06 (×5): 6 via TOPICAL

## 2022-09-02 MED ORDER — ADULT MULTIVITAMIN W/MINERALS CH
1.0000 | ORAL_TABLET | Freq: Every day | ORAL | Status: DC
  Administered 2022-09-02 – 2022-09-06 (×5): 1 via ORAL
  Filled 2022-09-02 (×5): qty 1

## 2022-09-02 MED ORDER — CITALOPRAM HYDROBROMIDE 20 MG PO TABS
10.0000 mg | ORAL_TABLET | Freq: Every day | ORAL | Status: DC
  Administered 2022-09-02 – 2022-09-06 (×5): 10 mg via ORAL
  Filled 2022-09-02 (×5): qty 1

## 2022-09-02 NOTE — Progress Notes (Signed)
Initial Nutrition Assessment  DOCUMENTATION CODES:   Severe malnutrition in context of chronic illness, Underweight  INTERVENTION:   -Ensure Plus High Protein po BID, each supplement provides 350 kcal and 20 grams of protein.   -Multivitamin with minerals daily  -Added ice cream to lunch and dinner trays  NUTRITION DIAGNOSIS:   Severe Malnutrition related to chronic illness (dementia) as evidenced by severe fat depletion, severe muscle depletion.  GOAL:   Patient will meet greater than or equal to 90% of their needs  MONITOR:   PO intake, Supplement acceptance, Labs, Weight trends, I & O's  REASON FOR ASSESSMENT:   Consult Hip fracture protocol  ASSESSMENT:   71 y.o. female with past medical history significant for dementia, essential hypertension, hyperlipidemia who presented from [facility] with altered mental status and fall.  10/1: s/p INTRAMEDULLARY (IM) NAIL INTERTROCHANTERIC  Patient sleeping in room, son at bedside. Pt unable to give history given dementia. Per son, pt from memory care facility. She receives mightyshakes at facility. Pt has not been eating well d/t dementia.  Son agreed with pt receiving Ensure supplements. He also reports she likes ice cream, will add to meal trays.  Pt's son not able to report UBW. Per weight records, weight is trending down but not significant for time frame.   Medications: Colace  Labs reviewed:  Low iron  NUTRITION - FOCUSED PHYSICAL EXAM:  Flowsheet Row Most Recent Value  Orbital Region Severe depletion  Upper Arm Region Severe depletion  Thoracic and Lumbar Region Severe depletion  Buccal Region Severe depletion  Temple Region Severe depletion  Clavicle Bone Region Severe depletion  Clavicle and Acromion Bone Region Severe depletion  Scapular Bone Region Severe depletion  Dorsal Hand Severe depletion  Patellar Region Severe depletion  Anterior Thigh Region Severe depletion  Posterior Calf  Region Severe depletion  Edema (RD Assessment) None  Hair Reviewed  [thin]  Eyes Unable to assess  [sleeping]  Mouth Reviewed  [poor dentition]  Skin Reviewed  [pale]  Nails Reviewed       Diet Order:   Diet Order             Diet regular Room service appropriate? Yes; Fluid consistency: Thin  Diet effective now                   EDUCATION NEEDS:   No education needs have been identified at this time  Skin:  Skin Assessment: Skin Integrity Issues: Skin Integrity Issues:: Incisions Incisions: 10/1 right hip  Last BM:  10/2 -type 6  Height:   Ht Readings from Last 1 Encounters:  09/01/22 5\' 7"  (1.702 m)    Weight:   Wt Readings from Last 1 Encounters:  09/01/22 50.2 kg    BMI:  Body mass index is 17.33 kg/m.  Estimated Nutritional Needs:   Kcal:  1400-1600  Protein:  65-75g  Fluid:  1.6L/day  Clayton Bibles, MS, RD, LDN Inpatient Clinical Dietitian Contact information available via Amion

## 2022-09-02 NOTE — TOC Initial Note (Addendum)
Transition of Care [facility]) - Initial/Assessment Note    Patient Details  Name: Nancy [name] MRN: [id number] Date of Birth: 24-Aug-1951  Transition of Care [facility]) CM/SW Contact:    Vassie Moselle, LCSW Phone Number: 09/02/2022, 1:10 PM  Clinical Narrative:                 CSW left voicemail with pt's son to introduce self and discuss discharge plans.  Pt is from [facility] and per Asencion Partridge w/ [facility] she is able to return at discharge. She will need a new FL-2 faxed to [phone] prior to returning.  Pt is active with Hospice services through [place].   Update 3:20pm- CSW met with pt's son at bedside. Pt's son confirms plan for pt to return to [facility]. He reports that pt may be recommended for wheelchair and/or walker as he is unsure if she has these items at [facility]. TOC will continue to follow.  Expected Discharge Plan: Memory Care Barriers to Discharge: Continued Medical Work up   Patient Goals and CMS Choice Patient states their goals for this hospitalization and ongoing recovery are:: Unable to obtain   Choice offered to / list presented to : [place] / [facility]  Expected Discharge Plan and Services Expected Discharge Plan: Memory Care In-house Referral: NA Discharge Planning Services: CM Consult Post Acute Care Choice: Resumption of Svcs/PTA Provider Living arrangements for the past 2 months: [facility]                 DME Arranged: N/A DME Agency: NA         HH Agency: [facility] [place]        Prior Living Arrangements/Services Living arrangements for the past 2 months: [place] Lives with:: Facility Resident Patient language and need for interpreter reviewed:: Yes Do you feel safe going back to the place where you live?: Yes      Need for Family Participation in Patient Care: Yes (Comment) Care giver support system in place?: Yes (comment) Current home services:  DME Criminal Activity/Legal Involvement Pertinent to Current Situation/Hospitalization: No - Comment as needed  Activities of Daily Living Home Assistive Devices/Equipment: Other (Comment) (from memory care unit) ADL Screening (condition at time of admission) Patient's cognitive ability adequate to safely complete daily activities?: No Is the patient deaf or have difficulty hearing?: Yes Does the patient have difficulty seeing, even when wearing glasses/contacts?: No Does the patient have difficulty concentrating, remembering, or making decisions?: Yes Patient able to express need for assistance with ADLs?: No Does the patient have difficulty dressing or bathing?: Yes Independently performs ADLs?: No Does the patient have difficulty walking or climbing stairs?: Yes Weakness of Legs: Both Weakness of Arms/Hands: Both  Permission Sought/Granted Permission sought to share information with : Facility Sport and exercise psychologist, Family Supports Permission granted to share information with : No  Share Information with NAME: Jakaria Lavergne     Permission granted to share info w Relationship: Son  Permission granted to share info w Contact Information: [phone]  Emotional Assessment Appearance:: Other (Comment Required (Unable to assess) Attitude/Demeanor/Rapport: Unable to Assess Affect (typically observed): Unable to Assess Orientation: : Oriented to Self Alcohol / Substance Use: Not Applicable Psych Involvement: No (comment)  Admission diagnosis:  Closed right hip fracture ([place]) [S72.001A] Closed displaced intertrochanteric fracture of right femur, initial encounter [facility]) [S72.141A] Patient Active Problem List   Diagnosis Date Noted   Closed right hip fracture ([place]) 09/01/2022  SIRS (systemic inflammatory response syndrome) ([place]) 09/01/2022   Acute urinary retention 09/01/2022   Elevated LFTs 09/01/2022   Dysphagia 12/06/2021   Stage I pressure ulcer of sacral region 19/59/7471    Acute metabolic encephalopathy 85/50/1586   Aortic atherosclerosis ([place]) 12/06/2021   Pressure injury of skin 12/05/2021   Malnutrition of moderate degree 12/05/2021   Demand ischemia 12/04/2021   Thoracic compression fracture ([place]) 12/04/2021   Hyperlipidemia    Hypertension    Dementia ([place])    Thrombocytosis    Normocytic anemia    Mild protein malnutrition ([place])    PCP:  Pcp, No Pharmacy:   [place], [place] [address] [place] [address] Phone: [phone] Fax: [phone]     Social Determinants of Health (SDOH) Interventions    Readmission Risk Interventions    09/02/2022    1:07 PM  Readmission Risk Prevention Plan  Transportation Screening Complete  PCP or Specialist Appt within 5-7 Days Complete  Home Care Screening Complete  Medication Review (RN CM) Complete

## 2022-09-02 NOTE — Progress Notes (Signed)
PROGRESS NOTE    Nancy [name]  NGE:[id number] DOB: [birth year] DOA: 09/01/2022 PCP: Pcp, No    Brief Narrative:   Nancy MiresMartha Markos is a 71 y.o. female with past medical history significant for dementia, essential hypertension, hyperlipidemia who presented from [facility] with altered mental status and fall.  History obtained from chart review as there is no family at bedside and patient with underlying dementia and unable to participate in HPI.  Apparently patient with a fall a few days prior, initially reported okay but it was noted that on day of admission in the morning that she was confused unable to bear weight on the right hip.  The facility became concerned and sent her to the ED for further evaluation.  In the ED, temperature 98.1 F, HR 87, RR 16, BP 121/67, SPO2 98% on room air.  WBC 13.4, hemoglobin 9.1, platelet count 200.  Sodium 141, potassium 3.5, chloride 111, CO2 27, glucose 114, BUN 17, creatinine 0.79.  AST 112, ALT 37, total bilirubin 1.1.  Urinalysis with negative leukocytes, negative nitrite, no bacteria, 0-5 WBCs.  CT head without contrast with no acute intracranial abnormality.  CT C-spine with no evidence of intracranial or cervical spine injury.  Chest x-ray with improved aeration left lower lung with mild linear atelectasis/scarring, resolution of prior pneumonia seen on 12/04/2021.  Minimal angulation posterior right fourth through seventh ribs age-indeterminate, likely subacute or older with no pneumothorax.  Left elbow with no acute osseous abnormality. Right hip/pelvis x-ray with acute injury generic proximal right femoral fracture with mild displacement, moderate varus angulation and moderate to high-grade impaction.  EDP consulted orthopedics, TRH consulted for admission and further evaluation and treatment of AMS and acute right hip fracture.  Assessment & Plan:   Right hip fracture s/p ORIF IMN Patient presenting from SNF following fall 2 days prior to  admission with patient on will to bear weight to right lower extremity.  Right hip/pelvis x-ray with acute injury generic proximal right femoral fracture with mild displacement, moderate varus angulation and moderate to high-grade impaction.  Orthopedics was consulted and patient underwent ORIF with IMN on 09/01/2022 by Dr. August Saucerean. -- Orthopedics following, appreciate assistance -- Vitamin D 25-hydroxy level: Pending -- Aspirin 81 mg p.o. twice daily for DVT prophylaxis per orthopedics -- Tylenol 1-2 tablets every 6 hours as needed moderate pain -- Oxycodone 5-10 mg p.o. every 4 hours as needed severe pain -- Pending PT/OT evaluation  UTI Urinary retention Patient was noted to have urinary retention with a residual greater than 400 mL.  Foley catheter was attempted multiple times by ED staff and unsuccessful.  Urology was consulted and patient underwent Foley catheter placement by Dr. Alvester MorinBell on 09/01/2022.  Urinalysis with negative nitrite, negative leukocytes, no bacteria 0-5 WBCs. --Urine culture: Pending --We will continue ceftriaxone for 3-day course --Continue Foley catheter for 5 days or more, peripherally once she is able to mobilize more prior to consideration of voiding trial per urology  Rib fracture, subacute to chronic Incidentally noted on chest x-ray, minimal angulation posterior right fourth through seventh ribs which are age-indeterminate likely subacute or older with no pneumothorax. --Supportive care, pain control with Tylenol, incentive spirometry  Anemia of chronic medical disease Hemoglobin 8.3 with normal MCV.  Iron low at 10 but TIBC also low at 228 which is consistent with anemia of chronic disease. --CBC daily, goal hemoglobin greater than 7.0.  Essential hypertension Hyperlipidemia Currently not on antihypertensive or cholesterol medicine outpatient.  BP 108/58 this  morning, well controlled. -- Continue monitor BP closely  Depression: Continue Celexa  Advanced  dementia --Delirium precautions --Get up during the day --Encourage a familiar face to remain present throughout the day --Keep blinds open and lights on during daylight hours --Minimize the use of opioids/benzodiazepines --Continue home Zyprexa 10 mg p.o. nightly     DVT prophylaxis: SCDs Start: 09/01/22 2330 Place TED hose Start: 09/01/22 2330 SCDs Start: 09/01/22 1845    Code Status: Full Code Family Communication: No family present at bedside this morning  Disposition Plan:  Level of care: Telemetry Status is: Inpatient Remains inpatient appropriate because: PT/OT evaluation, anticipate discharge back to [facility] for likely in 1-2 days    Consultants:  Orthopedics, Dr. August Saucer  Procedures:  ORIF right hip with IM nail, Dr. August Saucer 09/01/2022  Antimicrobials:  Ceftriaxone 10/1>> Perioperative cefazolin   Subjective: Seen examined at bedside, resting comfortably.  Pleasantly confused.  No family present.  Underwent surgical repair of her right hip yesterday.  Pending therapy evaluation.  Patient denies pain.  Unable to obtain any further ROS from patient due to her underlying dementia.  No acute concerns overnight per nursing staff.  Objective: Vitals:   09/01/22 2348 09/02/22 0220 09/02/22 0600 09/02/22 1122  BP: (!) 109/53 116/60 (!) 108/58 (!) 95/48  Pulse: (!) 103 89 94 (!) 120  Resp: 18 16 16 16   Temp: 97.6 F (36.4 C) 97.9 F (36.6 C) 97.8 F (36.6 C) 98.2 F (36.8 C)  TempSrc: Axillary Axillary Oral Oral  SpO2: 98% 97% 98% 100%  Weight:      Height:        Intake/Output Summary (Last 24 hours) at 09/02/2022 1214 Last data filed at 09/02/2022 1049 Gross per 24 hour  Intake 970 ml  Output 575 ml  Net 395 ml   Filed Weights   09/01/22 0938 09/01/22 1700  Weight: 49.9 kg 50.2 kg    Examination:  Physical Exam: GEN: NAD, alert, pleasantly confused HEENT: NCAT, PERRL, EOMI, sclera clear, dry mucous membranes, poor dentition PULM: CTAB  w/o wheezes/crackles, normal respiratory effort, on room air with SPO2 99% at rest CV: RRR w/o M/G/R GI: abd soft, NTND, NABS, no R/G/M MSK: no peripheral edema, moves all extremities independently, right hip surgical site noted with dressing in place, clean/dry/intact NEURO: CN II-XII intact, no focal deficits, sensation to light touch intact PSYCH: normal mood/affect Integumentary: Right hip surgical site as above, otherwise no other concerning rashes/lesions/wounds noted on exposed skin surfaces.     Data Reviewed: I have personally reviewed following labs and imaging studies  CBC: Recent Labs  Lab 09/01/22 0943 09/02/22 0717  WBC 13.4* 12.4*  NEUTROABS 11.2*  --   HGB 9.1* 8.3*  HCT 27.9* 26.0*  MCV 90.6 92.9  PLT 200 178   Basic Metabolic Panel: Recent Labs  Lab 09/01/22 0943 09/02/22 0717  NA 141 141  K 3.5 4.5  CL 111 112*  CO2 27 24  GLUCOSE 114* 137*  BUN 17 17  CREATININE 0.79 0.77  CALCIUM 8.3* 7.9*   GFR: Estimated Creatinine Clearance: 50.4 mL/min (by C-G formula based on SCr of 0.77 mg/dL). Liver Function Tests: Recent Labs  Lab 09/01/22 0943  AST 112*  ALT 37  ALKPHOS 64  BILITOT 1.1  PROT 6.3*  ALBUMIN 3.3*   No results for input(s): "LIPASE", "AMYLASE" in the last 168 hours. No results for input(s): "AMMONIA" in the last 168 hours. Coagulation Profile: Recent Labs  Lab 09/01/22 1450  INR 1.2   Cardiac Enzymes: No results for input(s): "CKTOTAL", "CKMB", "CKMBINDEX", "TROPONINI" in the last 168 hours. BNP (last 3 results) No results for input(s): "PROBNP" in the last 8760 hours. HbA1C: No results for input(s): "HGBA1C" in the last 72 hours. CBG: Recent Labs  Lab 09/01/22 0942  GLUCAP 112*   Lipid Profile: No results for input(s): "CHOL", "HDL", "LDLCALC", "TRIG", "CHOLHDL", "LDLDIRECT" in the last 72 hours. Thyroid Function Tests: No results for input(s): "TSH", "T4TOTAL", "FREET4", "T3FREE", "THYROIDAB" in the last 72  hours. Anemia Panel: Recent Labs    09/02/22 0414  TIBC 228*  IRON 10*   Sepsis Labs: Recent Labs  Lab 09/01/22 1620  LATICACIDVEN 0.9    No results found for this or any previous visit (from the past 240 hour(s)).       Radiology Studies: DG FEMUR, MIN 2 VIEWS RIGHT  Result Date: 09/02/2022 CLINICAL DATA:  Status post ORIF right femoral fracture EXAM: RIGHT FEMUR 2 VIEWS COMPARISON:  09/01/2022 FINDINGS: Medullary rod is noted within the right femur with proximal fixation screw traversing the femoral neck. Fracture fragments are in near anatomic alignment. IMPRESSION: Status post ORIF of proximal right femoral fracture. Electronically Signed   By: Inez Catalina M.D.   On: 09/02/2022 01:08   DG FEMUR, MIN 2 VIEWS RIGHT  Result Date: 09/01/2022 CLINICAL DATA:  Intraoperative right IM nail EXAM: RIGHT FEMUR 2 VIEWS; DG C-ARM 1-60 MIN-NO REPORT COMPARISON:  Right femur/hip radiographs dated 09/01/2022 Fluoroscopy time: 42 seconds 7.02 mGy FINDINGS: Intraoperative fluoroscopic images during IM nail with dynamic hip screw fixation of an intertrochanteric right hip fracture. Displaced lesser trochanteric fragment. Fracture fragments are otherwise in near anatomic alignment. IMPRESSION: Intraoperative fluoroscopic images during ORIF of a right hip fracture, as above. Electronically Signed   By: Julian Hy M.D.   On: 09/01/2022 22:01   DG C-Arm 1-60 Min-No Report  Result Date: 09/01/2022 Fluoroscopy was utilized by the requesting physician.  No radiographic interpretation.   DG Elbow Complete Left  Result Date: 09/01/2022 CLINICAL DATA:  Fall earlier today. EXAM: LEFT ELBOW - COMPLETE 3+ VIEW COMPARISON:  None Available. FINDINGS: There is no evidence of fracture, dislocation, or joint effusion. There is no evidence of focal bone abnormality. Mild osteoarthritis of the ulnohumeral joint. Soft tissues are unremarkable. IMPRESSION: No acute osseous abnormality. Electronically Signed    By: Ileana Roup M.D.   On: 09/01/2022 11:39   DG Hip Unilat W or Wo Pelvis 2-3 Views Left  Result Date: 09/01/2022 CLINICAL DATA:  Fall earlier today. EXAM: DG HIP (WITH OR WITHOUT PELVIS) 2V LEFT COMPARISON:  Pelvis radiograph from earlier today. FINDINGS: There is no evidence of hip fracture or dislocation. There is no evidence of arthropathy or other focal bone abnormality. Osteopenia. IMPRESSION: Negative left hip. Electronically Signed   By: Jorje Guild M.D.   On: 09/01/2022 11:29   CT Head Wo Contrast  Result Date: 09/01/2022 CLINICAL DATA:  Head injury.  Fall. EXAM: CT HEAD WITHOUT CONTRAST CT CERVICAL SPINE WITHOUT CONTRAST TECHNIQUE: Multidetector CT imaging of the head and cervical spine was performed following the standard protocol without intravenous contrast. Multiplanar CT image reconstructions of the cervical spine were also generated. RADIATION DOSE REDUCTION: This exam was performed according to the departmental dose-optimization program which includes automated exposure control, adjustment of the mA and/or kV according to patient size and/or use of iterative reconstruction technique. COMPARISON:  01/13/2022 FINDINGS: CT HEAD FINDINGS Brain: No evidence of acute infarction, hemorrhage, hydrocephalus, extra-axial  collection or mass lesion/mass effect. Mild cerebral volume loss and chronic small vessel ischemia. Vascular: No hyperdense vessel or unexpected calcification. Skull: Normal. Negative for fracture or focal lesion. Sinuses/Orbits: No evidence of injury. CT CERVICAL SPINE FINDINGS Alignment: Normal. Skull base and vertebrae: No acute fracture. No primary bone lesion or focal pathologic process. Soft tissues and spinal canal: No prevertebral fluid or swelling. No visible canal hematoma. Disc levels:  Ordinary degenerative changes. Upper chest: No evidence of injury IMPRESSION: No evidence of intracranial or cervical spine injury. Electronically Signed   By: Tiburcio Pea M.D.    On: 09/01/2022 10:48   CT Cervical Spine Wo Contrast  Result Date: 09/01/2022 CLINICAL DATA:  Head injury.  Fall. EXAM: CT HEAD WITHOUT CONTRAST CT CERVICAL SPINE WITHOUT CONTRAST TECHNIQUE: Multidetector CT imaging of the head and cervical spine was performed following the standard protocol without intravenous contrast. Multiplanar CT image reconstructions of the cervical spine were also generated. RADIATION DOSE REDUCTION: This exam was performed according to the departmental dose-optimization program which includes automated exposure control, adjustment of the mA and/or kV according to patient size and/or use of iterative reconstruction technique. COMPARISON:  01/13/2022 FINDINGS: CT HEAD FINDINGS Brain: No evidence of acute infarction, hemorrhage, hydrocephalus, extra-axial collection or mass lesion/mass effect. Mild cerebral volume loss and chronic small vessel ischemia. Vascular: No hyperdense vessel or unexpected calcification. Skull: Normal. Negative for fracture or focal lesion. Sinuses/Orbits: No evidence of injury. CT CERVICAL SPINE FINDINGS Alignment: Normal. Skull base and vertebrae: No acute fracture. No primary bone lesion or focal pathologic process. Soft tissues and spinal canal: No prevertebral fluid or swelling. No visible canal hematoma. Disc levels:  Ordinary degenerative changes. Upper chest: No evidence of injury IMPRESSION: No evidence of intracranial or cervical spine injury. Electronically Signed   By: Tiburcio Pea M.D.   On: 09/01/2022 10:48   DG Chest 1 View  Result Date: 09/01/2022 CLINICAL DATA:  Fall.  Right hip fracture. EXAM: CHEST  1 VIEW COMPARISON:  AP chest 12/04/2021, chest two views 11/29/2021; CTA chest 12/04/2021 FINDINGS: There is improved aeration of the left lower lung, now with mild linear likely atelectasis and scarring where previously there was higher density heterogeneous airspace opacification on 11/29/2021 and 12/04/2021 radiographs and 12/04/2021 CT. The  right lung is clear. No pleural effusion or pneumothorax. Cardiac silhouette and mediastinal contours within normal limits. Mild-to-moderate calcification within the aortic arch. There is new minimal angulation of the posterior right fourth through seventh ribs, age indeterminate fractures. There may be mild healing callus formation in this region and these are favored to be acute to subacute. IMPRESSION: 1. Improved aeration of the left lower lung, now with likely mild linear atelectasis and scarring. There appears to be resolution of the prior pneumonia seen on 12/04/2021. 2. New minimal angulation of the posterior right fourth through seventh ribs, age indeterminate fractures. There may be mild healing callus formation at these fractures, and these are favored to be subacute or older. Recommend clinical correlation. No pneumothorax. Electronically Signed   By: Neita Garnet M.D.   On: 09/01/2022 10:44   DG Hip Unilat W or Wo Pelvis 2-3 Views Right  Result Date: 09/01/2022 CLINICAL DATA:  Fall.  Right hip pain. EXAM: DG HIP (WITH OR WITHOUT PELVIS) 2-3V RIGHT; RIGHT FEMUR 2 VIEWS COMPARISON:  AP pelvis 08/30/2020 FINDINGS: There is an acute fracture of the right greater trochanter extending through the intertrochanteric region and through the lesser trochanter. Mild superior and medial displacement of lesser trochanter.  Moderate varus angulation. Moderate high-grade fracture impaction. The right femoral head appears normally located with respect to the right acetabulum. Minimal superior left femoroacetabular joint space narrowing. Minimal superolateral left acetabular degenerative osteophytosis. Joint spaces are preserved. No acute fracture is seen within the more distal aspect of the right femur. Moderate right knee osteoarthritis. IMPRESSION: Acute intertrochanteric proximal right femoral fracture with mild displacement, moderate varus angulation, and moderate to high-grade fracture impaction. Electronically  Signed   By: Neita Garnet M.D.   On: 09/01/2022 10:39   DG Femur Min 2 Views Right  Result Date: 09/01/2022 CLINICAL DATA:  Fall.  Right hip pain. EXAM: DG HIP (WITH OR WITHOUT PELVIS) 2-3V RIGHT; RIGHT FEMUR 2 VIEWS COMPARISON:  AP pelvis 08/30/2020 FINDINGS: There is an acute fracture of the right greater trochanter extending through the intertrochanteric region and through the lesser trochanter. Mild superior and medial displacement of lesser trochanter. Moderate varus angulation. Moderate high-grade fracture impaction. The right femoral head appears normally located with respect to the right acetabulum. Minimal superior left femoroacetabular joint space narrowing. Minimal superolateral left acetabular degenerative osteophytosis. Joint spaces are preserved. No acute fracture is seen within the more distal aspect of the right femur. Moderate right knee osteoarthritis. IMPRESSION: Acute intertrochanteric proximal right femoral fracture with mild displacement, moderate varus angulation, and moderate to high-grade fracture impaction. Electronically Signed   By: Neita Garnet M.D.   On: 09/01/2022 10:39   DG Shoulder Right  Result Date: 09/01/2022 CLINICAL DATA:  Fall. EXAM: RIGHT SHOULDER - 2+ VIEW COMPARISON:  Right shoulder and humerus radiographs 08/30/2020 FINDINGS: Interval healing of the prior remote nondisplaced greater tuberosity and mildly displaced surgical neck fractures. There is diffuse decreased bone mineralization. There are some lucencies in the region of the prior fracture lines, however the margins appear corticated and these are favored to represent the patient's now healed fracture. No definite acute fracture is seen. Mild glenohumeral joint space narrowing. Minimally angulated posterior 4th through seventh rib fractures with mild healing callus formation, subacute to chronic. IMPRESSION: 1. Interval healing of the remote nondisplaced greater tuberosity and mildly displaced surgical neck  fractures. 2. No definite acute fracture is seen. 3. Minimally angulated posterior 4th through seventh rib fractures with mild healing callus formation, subacute to chronic. Electronically Signed   By: Neita Garnet M.D.   On: 09/01/2022 10:34        Scheduled Meds:  acetaminophen  1,000 mg Oral Q6H   aspirin  81 mg Oral BID   Chlorhexidine Gluconate Cloth  6 each Topical Daily   docusate sodium  100 mg Oral BID   feeding supplement  237 mL Oral BID BM   multivitamin with minerals  1 tablet Oral Daily   Continuous Infusions:  sodium chloride 100 mL/hr at 09/02/22 0931   cefTRIAXone (ROCEPHIN)  IV 200 mL/hr at 09/02/22 1049   methocarbamol (ROBAXIN) IV       LOS: 1 day    Time spent: 52 minutes spent on chart review, discussion with nursing staff, consultants, updating family and interview/physical exam; more than 50% of that time was spent in counseling and/or coordination of care.    Alvira Philips [place], DO [facility] Available via Epic secure chat 7am-7pm After these hours, please refer to coverage provider listed on [email or web address] 09/02/2022, 12:14 PM

## 2022-09-02 NOTE — Anesthesia Postprocedure Evaluation (Signed)
Anesthesia Post Note  Patient: [name]  Procedure(s) Performed: INTRAMEDULLARY (IM) NAIL INTERTROCHANTERIC (Right)     Patient location during evaluation: PACU Anesthesia Type: General Level of consciousness: obtunded/minimal responses Pain management: pain level controlled Vital Signs Assessment: post-procedure vital signs reviewed and stable Respiratory status: spontaneous breathing, nonlabored ventilation, respiratory function stable and patient connected to nasal cannula oxygen Cardiovascular status: blood pressure returned to baseline, stable and tachycardic Postop Assessment: no apparent nausea or vomiting Anesthetic complications: no   No notable events documented.  Last Vitals:  Vitals:   09/01/22 2315 09/01/22 2348  BP: (!) 96/50 (!) 109/53  Pulse: (!) 103 (!) 103  Resp: 14 18  Temp: 36.8 C 36.4 C  SpO2: 100% 98%    Last Pain:  Vitals:   09/01/22 2348  TempSrc: Oral  PainSc:                  Santa Lighter

## 2022-09-02 NOTE — Evaluation (Signed)
Physical Therapy Evaluation Patient Details Name: Harlee Eckroth MRN: [id number] DOB: 1951-08-14 Today's Date: 09/02/2022  History of Present Illness  Patient is 71 y.o. female presented on 10/1 from [facility] after a fall. Pt noted to have Rt hip fracture and no s/p ORIF for IMN on 10/1. PMH significant for dementia, essential hypertension, hyperlipidemia.    Clinical Impression  Darolyn Double is a 71 y.o. female POD 1 s/p ORIF to Rt femoral neck fracture. PTA pt was independent with no device for mobility. Patient is now limited by functional impairments (see PT problem list below) and requires Mod-Max assist for bed mobility and transfers with RW. She is NWB of Rt LE with TDWB allowed for stand pivot transfers to aide in balance. Patient will benefit from continued skilled PT interventions to address impairments and progress towards PLOF. Acute PT will follow to progress mobility as able. Recommend return to [facility] with 24/7 care and PT at facility.       Recommendations for follow up therapy are one component of a multi-disciplinary discharge planning process, led by the attending physician.  Recommendations may be updated based on patient status, additional functional criteria and insurance authorization.  Follow Up Recommendations Skilled nursing-short term rehab (<3 hours/day) Can patient physically be transported by private vehicle: No    Assistance Recommended at Discharge Frequent or constant Supervision/Assistance  Patient can return home with the following  Two people to help with walking and/or transfers;Two people to help with bathing/dressing/bathroom;Assistance with cooking/housework;Direct supervision/assist for medications management;Assist for transportation;Help with stairs or ramp for entrance    Equipment Recommendations Rolling walker (2 wheels)  Recommendations for Other Services       Functional Status Assessment Patient has had a  recent decline in their functional status and demonstrates the ability to make significant improvements in function in a reasonable and predictable amount of time.     Precautions / Restrictions Precautions Precautions: Fall Restrictions Weight Bearing Restrictions: Yes RLE Weight Bearing: Non weight bearing Other Position/Activity Restrictions: TDWB for pivot transfers with RW only      Mobility  Bed Mobility Overal bed mobility: Needs Assistance Bed Mobility: Supine to Sit, Sit to Supine     Supine to sit: HOB elevated, Mod assist, Min assist Sit to supine: Mod assist, Max assist, +2 for safety/equipment   General bed mobility comments: Min-mod to initiated sitting up to EOB. assist to bring LE's off edge and raise trunk. 2+ mod-max to control return to supine.    Transfers Overall transfer level: Needs assistance Equipment used: Rolling walker (2 wheels) Transfers: Sit to/from Stand Sit to Stand: Mod assist, +2 physical assistance, +2 safety/equipment, From elevated surface, Max assist           General transfer comment: Mod-Max assist +2 to rise from EOB, pt unsteady    Ambulation/Gait                  Stairs            Wheelchair Mobility    Modified Rankin (Stroke Patients Only)       Balance Overall balance assessment: Needs assistance Sitting-balance support: Feet supported, Bilateral upper extremity supported Sitting balance-Leahy Scale: Fair     Standing balance support: Bilateral upper extremity supported Standing balance-Leahy Scale: Poor Standing balance comment: max +2  Pertinent Vitals/Pain Pain Assessment Pain Assessment: PAINAD Breathing: normal Negative Vocalization: occasional moan/groan, low speech, negative/disapproving quality Facial Expression: sad, frightened, frown Body Language: tense, distressed pacing, fidgeting Consolability: distracted or reassured by voice/touch PAINAD  Score: 4 Pain Intervention(s): Repositioned, Limited activity within patient's tolerance, Monitored during session    Home Living Family/patient expects to be discharged to:: Assisted living                 Home Equipment: Shower seat Additional Comments: at [facility] for ~2 years    Prior Function               Mobility Comments: mobilizing around hall at [facility] unit, no AD for ambulation and transfers. ADLs Comments: Staff assists with bathing and dressing.     Hand Dominance   Dominant Hand: Right    Extremity/Trunk Assessment   Upper Extremity Assessment Upper Extremity Assessment: Generalized weakness    Lower Extremity Assessment Lower Extremity Assessment: Generalized weakness    Cervical / Trunk Assessment Cervical / Trunk Assessment: Kyphotic  Communication   Communication:  (cognition limiting)  Cognition Arousal/Alertness: Awake/alert Behavior During Therapy: WFL for tasks assessed/performed Overall Cognitive Status: History of cognitive impairments - at baseline                                          General Comments      Exercises     Assessment/Plan    PT Assessment Patient needs continued PT services  PT Problem List Decreased balance;Decreased activity tolerance;Decreased mobility;Decreased cognition;Decreased knowledge of use of DME;Decreased strength;Decreased safety awareness;Decreased knowledge of precautions;Pain       PT Treatment Interventions DME instruction;Gait training;Stair training;Functional mobility training;Therapeutic activities;Therapeutic exercise;Balance training;Neuromuscular re-education;Cognitive remediation;Patient/family education    PT Goals (Current goals can be found in the Care Plan section)  Acute Rehab PT Goals Patient Stated Goal: get her recovered and walking again PT Goal Formulation: With family Time For Goal Achievement: 09/16/22 Potential to Achieve Goals:  Fair    Frequency Min 3X/week     Co-evaluation               AM-PAC PT "6 Clicks" Mobility  Outcome Measure Help needed turning from your back to your side while in a flat bed without using bedrails?: Total Help needed moving from lying on your back to sitting on the side of a flat bed without using bedrails?: Total Help needed moving to and from a bed to a chair (including a wheelchair)?: Total Help needed standing up from a chair using your arms (e.g., wheelchair or bedside chair)?: Total Help needed to walk in hospital room?: Total Help needed climbing 3-5 steps with a railing? : Total 6 Click Score: 6    End of Session Equipment Utilized During Treatment: Gait belt Activity Tolerance: Patient tolerated treatment well;Patient limited by pain Patient left: in bed;with bed alarm set;with call bell/phone within reach;with family/visitor present Nurse Communication: Mobility status PT Visit Diagnosis: Muscle weakness (generalized) (M62.81);Other abnormalities of gait and mobility (R26.89);Unsteadiness on feet (R26.81);Difficulty in walking, not elsewhere classified (R26.2);Pain Pain - Right/Left: Right Pain - part of body: Hip    Time: 0569-7948 PT Time Calculation (min) (ACUTE ONLY): 28 min   Charges:   PT Evaluation $PT Eval Moderate Complexity: 1 Mod PT Treatments $Therapeutic Activity: 8-22 mins        Renaldo Fiddler PT, DPT  Acute Rehabilitation Services Office [phone]  09/02/22 4:38 PM

## 2022-09-02 NOTE — Progress Notes (Signed)
[facility]) Hospitalized Hospice Patient   Nancy [name] is a current hospice patient with terminal diagnosis of Severe protien calorie malnutrition and dementia.  Patient had two falls at her facility Friday 9/29.  After hours [facility] nurse called to assess patient and found patient's right leg to be swollen, externally rotated and shortened compared to left.  Decision was made to have further evaluation at the ED.  Hospice was notified prior to sending patient to the hospital.  This is a related hospital admission per Dr. Antonieta Loveless, [facility] MD.    Visited with patient and son Nancy [name] at bedside.  Patient was alert but disoriented, happy to see her son.  Patient denies pain, receiving scheduled tylenol.  Patient asking to get out of bed.  Son voiced concerns about how to limit movement once patient has returned to facility to allow hip to heal.    At this time patient is inpatient appropriate as she is receiving IV fluid, IV antibiotics, IM nailing by ORTHO.   V/S:  97.8 oral, 108/58, 94 HR, 16 RR 98 @ 2 LPM  I&O:  1720/575  Labs:  WBC 12.4  HgB 8.3 UA NEG   Diagnostics:  DG FEMUR 2View: Proximal Right femur fracture with displacement DG Chest: new posterior right fourth through seventh ribs indeterminate fractures  IV/PRN:  0.9% sodium chloride IVF 122mL/h continuous Cefazolin IVPB 2g/113mL IV TID Tylenol IV 1g x1  Ceftriaxone IVBP 1g/145mL QD  Lactated Ringers bolus 571mL X1  Problem List:  RIght Hip Intertrochanteric Fracture s/p IM nailing -pain currently managed with tylenol -awaiting PT/OT consult  -At baseline patient ambulates around facility independently  Urinary Retention -foley placed in ED by Urologist   Acute on Chronic Metabolic Encephalopathy -UA NEG  -Dementia at baseline   Goals of Care:  Treat right hip fracture, manage pain, then return to memory care facility.    Discharge Planning:  Ongoing, family would like for patient to  return to [facility]  Family: Patient son, Nancy [name], was at bedside when patient was visited.  Support provided and questions answered.  Concerned about patient's ability to return to facility.  IDT: updated hospice staff.    Please contact Nancy [name] for transport if needed, as they are contracted to transport Nancy [name] active hospice patients.    Transfer summary and Medication list to shadow chart.    Kenna Gilbert BSN, RN  [place] chat or Amion under Hospice

## 2022-09-02 NOTE — Op Note (Unsigned)
Nancy [name], [name] MEDICAL RECORD NO: [id number] ACCOUNT NO: [id number] DATE OF BIRTH: September 14, 1951 FACILITY: [facility] LOCATION: [facility] PHYSICIAN: Yetta Barre. Marlou Sa, MD  Operative Report   DATE OF PROCEDURE: 09/01/2022  PREOPERATIVE DIAGNOSIS:  Right hip intertrochanteric fracture.  POSTOPERATIVE DIAGNOSIS:  Right hip intertrochanteric fracture.  PROCEDURE:  Right hip intertrochanteric fracture open reduction and internal fixation using Smith and Nephew nail.  SURGEON ATTENDING: Yetta Barre. Marlou Sa, MD  ASSISTANT:  Annie Main, PA.  INDICATIONS:  The patient is a 71 year old patient with right hip pain.  [address] and sustained an intertrochanteric fracture, presents now for operative management.  She does have dementia, but she is in an ambulator.  DESCRIPTION OF PROCEDURE:  The patient was brought to the operating room where general anesthetic was induced.  Preoperative antibiotics administered.  Timeout was called.  The patient was placed on the Hana bed with the left leg in lithotomy position  and peroneal nerve well padded.  Right leg placed under some traction and internal rotation.  Good reduction was achieved under fluoroscopic evaluation in the AP and lateral planes. Preoperative templating with the nail was performed.  The right hip  region was pre-scrubbed with alcohol and Betadine, allowed to air dry, prepped with DuraPrep solution and draped in a sterile manner.  After calling time-out, an incision 4 fingerbreadths proximal to the tip of the trochanter was made.  Skin and  subcutaneous tissues were sharply divided.  Guide pin placed across the fracture site.  Proximal reaming performed.  Nail was placed through a separate incision. Compression screw and lag screw were placed with very good placement of the screws in the  bottom half of the femoral neck and head.  Fluoroscopic imaging demonstrated good placement of the hardware, both in the hip and at the knee.  The fracture moved as a unit  with rotation after the compression screws were placed.  No distal interlocking  screws required.  Thorough irrigation was performed and incisions were anesthetized using Marcaine.  Incisions then closed using #1 Vicryl suture, 0 Vicryl suture, 2-0 Vicryl suture, and 3-0 nylon.  Aquacel dressing was placed.  The patient tolerated the  procedure well without immediate complication.  Luke's assistance was required at all times for retraction, opening, closing, mobilization of tissue and placement of hardware.  His assistance was a medical necessity during the entire case.   VAI D: 09/02/2022 12:44:50 am T: 09/02/2022 12:51:00 am  JOB: [id number]/ [id number]

## 2022-09-02 NOTE — Brief Op Note (Signed)
   09/01/2022  12:41 AM  PATIENT:  Nancy [name]  71 y.o. female  PRE-OPERATIVE DIAGNOSIS:  RIGHT HIP INTERTROCHANTERIC FRACTURE  POST-OPERATIVE DIAGNOSIS:  Right hip intertrochanteric fracture  PROCEDURE:  Procedure(s): INTRAMEDULLARY (IM) NAIL INTERTROCHANTERIC  SURGEON:  Surgeon(s): Marlou Sa, Tonna Corner, MD  ASSISTANT: Annie Main, PA  ANESTHESIA:   General  EBL: 50 ml    Total I/O In: 800 [I.V.:600; IV Piggyback:200] Out: 575 [Urine:525; Blood:50]  BLOOD ADMINISTERED: none  DRAINS: None  LOCAL MEDICATIONS USED: Marcaine SPECIMEN:  No Specimen  COUNTS:  YES  TOURNIQUET:  * No tourniquets in log *  DICTATION: .Other Dictation: Dictation Number done  PLAN OF CARE: Admit to inpatient   PATIENT DISPOSITION:  PACU - hemodynamically stable

## 2022-09-03 DIAGNOSIS — F03C4 Unspecified dementia, severe, with anxiety: Secondary | ICD-10-CM | POA: Diagnosis not present

## 2022-09-03 DIAGNOSIS — E876 Hypokalemia: Secondary | ICD-10-CM | POA: Diagnosis not present

## 2022-09-03 DIAGNOSIS — D62 Acute posthemorrhagic anemia: Secondary | ICD-10-CM | POA: Diagnosis not present

## 2022-09-03 DIAGNOSIS — E43 Unspecified severe protein-calorie malnutrition: Secondary | ICD-10-CM

## 2022-09-03 DIAGNOSIS — R338 Other retention of urine: Secondary | ICD-10-CM | POA: Diagnosis not present

## 2022-09-03 DIAGNOSIS — S72001A Fracture of unspecified part of neck of right femur, initial encounter for closed fracture: Secondary | ICD-10-CM | POA: Diagnosis not present

## 2022-09-03 DIAGNOSIS — G9341 Metabolic encephalopathy: Secondary | ICD-10-CM | POA: Diagnosis not present

## 2022-09-03 LAB — CBC
HCT: UNDETERMINED % (ref 36.0–46.0)
HCT: 23.1 % — ABNORMAL LOW (ref 36.0–46.0)
Hemoglobin: UNDETERMINED g/dL (ref 12.0–15.0)
Hemoglobin: 7.4 g/dL — ABNORMAL LOW (ref 12.0–15.0)
MCH: UNDETERMINED pg (ref 26.0–34.0)
MCH: 29.5 pg (ref 26.0–34.0)
MCHC: UNDETERMINED g/dL (ref 30.0–36.0)
MCHC: 32 g/dL (ref 30.0–36.0)
MCV: UNDETERMINED fL (ref 80.0–100.0)
MCV: 92 fL (ref 80.0–100.0)
Platelets: UNDETERMINED 10*3/uL (ref 150–400)
Platelets: 167 10*3/uL (ref 150–400)
RBC: UNDETERMINED MIL/uL (ref 3.87–5.11)
RBC: 2.51 MIL/uL — ABNORMAL LOW (ref 3.87–5.11)
RDW: UNDETERMINED % (ref 11.5–15.5)
RDW: 14.2 % (ref 11.5–15.5)
WBC: UNDETERMINED 10*3/uL (ref 4.0–10.5)
WBC: 9.5 10*3/uL (ref 4.0–10.5)
nRBC: UNDETERMINED % (ref 0.0–0.2)
nRBC: 0 % (ref 0.0–0.2)

## 2022-09-03 LAB — BASIC METABOLIC PANEL
Anion gap: 3 — ABNORMAL LOW (ref 5–15)
BUN: 16 mg/dL (ref 8–23)
CO2: 24 mmol/L (ref 22–32)
Calcium: 7.6 mg/dL — ABNORMAL LOW (ref 8.9–10.3)
Chloride: 117 mmol/L — ABNORMAL HIGH (ref 98–111)
Creatinine, Ser: 0.65 mg/dL (ref 0.44–1.00)
GFR, Estimated: >60 mL/min (ref >60)
Glucose, Bld: 122 mg/dL — ABNORMAL HIGH (ref 70–99)
Potassium: 3.4 mmol/L — ABNORMAL LOW (ref 3.5–5.1)
Sodium: 144 mmol/L (ref 135–145)

## 2022-09-03 LAB — HEMOGLOBIN AND HEMATOCRIT, BLOOD
HCT: 30.6 % — ABNORMAL LOW (ref 36.0–46.0)
Hemoglobin: 9.7 g/dL — ABNORMAL LOW (ref 12.0–15.0)

## 2022-09-03 LAB — IRON AND TIBC
Iron: 55 ug/dL (ref 28–170)
Saturation Ratios: 22 % (ref 10.4–31.8)
TIBC: 246 ug/dL — ABNORMAL LOW (ref 250–450)
UIBC: 191 ug/dL

## 2022-09-03 LAB — FERRITIN: Ferritin: 121 ng/mL (ref 11–307)

## 2022-09-03 LAB — RETICULOCYTES
Immature Retic Fract: 11.9 % (ref 2.3–15.9)
RBC.: 2.46 MIL/uL — ABNORMAL LOW (ref 3.87–5.11)
Retic Count, Absolute: 34.9 10*3/uL (ref 19.0–186.0)
Retic Ct Pct: 1.4 % (ref 0.4–3.1)

## 2022-09-03 LAB — FOLATE: Folate: 8.8 ng/mL (ref >5.9)

## 2022-09-03 LAB — VITAMIN B12: Vitamin B-12: 180 pg/mL (ref 180–914)

## 2022-09-03 LAB — PREPARE RBC (CROSSMATCH)

## 2022-09-03 MED ORDER — VITAMIN D (ERGOCALCIFEROL) 1.25 MG (50000 UNIT) PO CAPS
50000.0000 [IU] | ORAL_CAPSULE | ORAL | Status: DC
  Administered 2022-09-03: 50000 [IU] via ORAL
  Filled 2022-09-03: qty 1

## 2022-09-03 MED ORDER — POTASSIUM CHLORIDE 20 MEQ PO PACK
60.0000 meq | PACK | Freq: Once | ORAL | Status: AC
  Administered 2022-09-03: 60 meq via ORAL
  Filled 2022-09-03: qty 3

## 2022-09-03 MED ORDER — SODIUM CHLORIDE 0.9% IV SOLUTION: Freq: Once | INTRAVENOUS | Status: AC

## 2022-09-03 NOTE — Progress Notes (Addendum)
  Subjective: Nancy [name] is a 71 y.o. female s/p right Hip IM nail.  They are POD2.  Pt's pain is controlled.  She was asleep during evaluation and did not arise to give history.  She snored throughout the evaluation.   Objective: Vital signs in last 24 hours: Temp:  [97.7 F (36.5 C)-98.7 F (37.1 C)] 98.7 F (37.1 C) (10/03 1419) Pulse Rate:  [98-140] 114 (10/03 1419) Resp:  [16-22] 16 (10/03 1419) BP: (96-150)/(50-81) 141/63 (10/03 1419) SpO2:  [95 %-100 %] 100 % (10/03 1419)  Intake/Output from previous day: 10/02 0701 - 10/03 0700 In: 3375 [P.O.:1200; I.V.:1974.9; IV Piggyback:200.1] Out: 1150 [Urine:1150] Intake/Output this shift: Total I/O In: 1643.8 [P.O.:238; I.V.:1250.8; IV Piggyback:155] Out: -   Exam:  No gross blood or drainage overlying the dressing 2+ DP pulse No significant calf tenderness No pain with logroll of RLE  Labs: Recent Labs    09/01/22 0943 09/02/22 0717 09/03/22 0555 09/03/22 0956  HGB 9.1* 8.3* SPECIMEN CONTAMINATED, UNABLE TO PERFORM TEST(S). 7.4*   Recent Labs    09/03/22 0555 09/03/22 0956  WBC SPECIMEN CONTAMINATED, UNABLE TO PERFORM TEST(S). 9.5  RBC SPECIMEN CONTAMINATED, UNABLE TO PERFORM TEST(S). 2.51*  2.46*  HCT SPECIMEN CONTAMINATED, UNABLE TO PERFORM TEST(S). 23.1*  PLT SPECIMEN CONTAMINATED, UNABLE TO PERFORM TEST(S). 167   Recent Labs    09/02/22 0717 09/03/22 0956  NA 141 144  K 4.5 3.4*  CL 112* 117*  CO2 24 24  BUN 17 16  CREATININE 0.77 0.65  GLUCOSE 137* 122*  CALCIUM 7.9* 7.6*   Recent Labs    09/01/22 1450  INR 1.2    Assessment/Plan: Pt is POD2 s/p right hip IM nail.    -Disposition pending medical team clearance and decision; appreciate their assistance  -NWB to RLE; okay to TDWB for transfers only  -DVT Prophylaxis: aspirin 81mg  BID     [facility] 09/03/2022, 4:01 PM

## 2022-09-03 NOTE — Progress Notes (Addendum)
PROGRESS NOTE    Nancy [name]  WUJ:[id number] DOB: [birth year] DOA: 09/01/2022 PCP: Pcp, No    Brief Narrative:   Nancy [name] is a 71 y.o. female with past medical history significant for dementia, essential hypertension, hyperlipidemia who presented from [facility] with altered mental status and fall.  History obtained from chart review as there is no family at bedside and patient with underlying dementia and unable to participate in HPI.  Apparently patient with a fall a few days prior, initially reported okay but it was noted that on day of admission in the morning that she was confused unable to bear weight on the right hip.  The facility became concerned and sent her to the ED for further evaluation.  In the ED, temperature 98.1 F, HR 87, RR 16, BP 121/67, SPO2 98% on room air.  WBC 13.4, hemoglobin 9.1, platelet count 200.  Sodium 141, potassium 3.5, chloride 111, CO2 27, glucose 114, BUN 17, creatinine 0.79.  AST 112, ALT 37, total bilirubin 1.1.  Urinalysis with negative leukocytes, negative nitrite, no bacteria, 0-5 WBCs.  CT head without contrast with no acute intracranial abnormality.  CT C-spine with no evidence of intracranial or cervical spine injury.  Chest x-ray with improved aeration left lower lung with mild linear atelectasis/scarring, resolution of prior pneumonia seen on 12/04/2021.  Minimal angulation posterior right fourth through seventh ribs age-indeterminate, likely subacute or older with no pneumothorax.  Left elbow with no acute osseous abnormality. Right hip/pelvis x-ray with acute injury generic proximal right femoral fracture with mild displacement, moderate varus angulation and moderate to high-grade impaction.  EDP consulted orthopedics, TRH consulted for admission and further evaluation and treatment of AMS and acute right hip fracture.  Assessment & Plan:   Right hip fracture s/p ORIF IMN Patient presenting from SNF following fall 2 days prior to  admission with patient on will to bear weight to right lower extremity.  Right hip/pelvis x-ray with acute injury generic proximal right femoral fracture with mild displacement, moderate varus angulation and moderate to high-grade impaction.  Orthopedics was consulted and patient underwent ORIF with IMN on 09/01/2022 by Dr. Marlou Sa. -- Orthopedics following, appreciate assistance -- Vitamin D 25-hydroxy level: Pending -- Aspirin 81 mg p.o. twice daily for DVT prophylaxis per orthopedics -- Tylenol 1-2 tablets every 6 hours as needed moderate pain -- Oxycodone 5-10 mg p.o. every 4 hours as needed severe pain --TDWB RLE -- Pending PT/OT evaluation  Postoperative blood loss anemia --Hgb 6.5 this am; down from 8.3 yesterday.  --Check anemia panel --transfuse 2 unit PRBCs, repeat H&H 2 hours following transfusion --CBC in a.m.  Vitamin D deficiency Vitamin D 25-hydroxy level low at 22.  Started on vitamin D 50,000 units q. 7 days  Hypokalemia Potassium 3.4, will replete. --Repeat electrolytes in a.m. to include magnesium  UTI Urinary retention Patient was noted to have urinary retention with a residual greater than 400 mL.  Foley catheter was attempted multiple times by ED staff and unsuccessful.  Urology was consulted and patient underwent Foley catheter placement by Dr. Gloriann Loan on 09/01/2022.  Urinalysis with negative nitrite, negative leukocytes, no bacteria 0-5 WBCs.  Urine culture with less than 10K insignificant growth.  Completed 3-day course of ceftriaxone. --Continue Foley catheter for 5 days or more, urology recommended to continue until she is able to mobilize more prior to consideration of voiding trial per urology  Rib fracture, subacute to chronic Incidentally noted on chest x-ray, minimal angulation posterior right fourth  through seventh ribs which are age-indeterminate likely subacute or older with no pneumothorax. --Supportive care, pain control with Tylenol, incentive  spirometry  Anemia of chronic medical disease Hemoglobin 8.3 with normal MCV.  Iron low at 10 but TIBC also low at 228 which is consistent with anemia of chronic disease. --CBC daily, goal hemoglobin greater than 7.0.  Essential hypertension Hyperlipidemia Currently not on antihypertensive or cholesterol medicine outpatient.  BP 108/58 this morning, well controlled. -- Continue monitor BP closely  Depression: Continue Celexa  Advanced dementia --Delirium precautions --Get up during the day --Encourage a familiar face to remain present throughout the day --Keep blinds open and lights on during daylight hours --Minimize the use of opioids/benzodiazepines --Continue home Zyprexa 10 mg p.o. nightly     DVT prophylaxis: SCDs Start: 09/01/22 2330 Place TED hose Start: 09/01/22 2330 SCDs Start: 09/01/22 1845    Code Status: Full Code Family Communication: No family present at bedside this morning  Disposition Plan:  Level of care: Telemetry Status is: Inpatient Remains inpatient appropriate because: PT/OT evaluation, anticipate discharge back to [facility] for likely in 1-2 days    Consultants:  Orthopedics, Dr. August Saucer  Procedures:  ORIF right hip with IM nail, Dr. August Saucer 09/01/2022  Antimicrobials:  Ceftriaxone 10/1>> Perioperative cefazolin   Subjective: Seen examined at bedside, resting comfortably.  Pleasantly confused.  No family present.  Pending therapy evaluation.  Patient denies pain.  Unable to obtain any further ROS from patient due to her underlying dementia.  Received message from RN that hemoglobin 6.5, will transfuse.  No obvious signs of bleeding, surgical site intact.  No other acute concerns overnight per nursing staff.  Objective: Vitals:   09/03/22 0858 09/03/22 1000 09/03/22 1037 09/03/22 1233  BP: (!) 140/72 (!) 112/54 (!) 118/56 137/63  Pulse: (!) 120 (!) 117 (!) 109 98  Resp: (!) 22 18 18 18   Temp: 98.1 F (36.7 C) 97.7 F (36.5 C)  98.3 F (36.8 C) 98.2 F (36.8 C)  TempSrc: Oral Oral Oral Oral  SpO2: 99% 99% 99% 100%  Weight:      Height:        Intake/Output Summary (Last 24 hours) at 09/03/2022 1323 Last data filed at 09/03/2022 0920 Gross per 24 hour  Intake 1026.49 ml  Output 1150 ml  Net -123.51 ml   Filed Weights   09/01/22 0938 09/01/22 1700  Weight: 49.9 kg 50.2 kg    Examination:  Physical Exam: GEN: NAD, alert, pleasantly confused HEENT: NCAT, PERRL, EOMI, sclera clear, dry mucous membranes, poor dentition PULM: CTAB w/o wheezes/crackles, normal respiratory effort, on room air with SPO2 99% at rest CV: RRR w/o M/G/R GI: abd soft, NTND, NABS, no R/G/M MSK: no peripheral edema, moves all extremities independently, right hip surgical site noted with dressing in place, clean/dry/intact NEURO: CN II-XII intact, no focal deficits, sensation to light touch intact PSYCH: normal mood/affect Integumentary: Right hip surgical site as above, otherwise no other concerning rashes/lesions/wounds noted on exposed skin surfaces.     Data Reviewed: I have personally reviewed following labs and imaging studies  CBC: Recent Labs  Lab 09/01/22 0943 09/02/22 0717 09/03/22 0555 09/03/22 0956  WBC 13.4* 12.4* SPECIMEN CONTAMINATED, UNABLE TO PERFORM TEST(S). 9.5  NEUTROABS 11.2*  --   --   --   HGB 9.1* 8.3* SPECIMEN CONTAMINATED, UNABLE TO PERFORM TEST(S). 7.4*  HCT 27.9* 26.0* SPECIMEN CONTAMINATED, UNABLE TO PERFORM TEST(S). 23.1*  MCV 90.6 92.9 SPECIMEN CONTAMINATED, UNABLE TO PERFORM TEST(S). 92.0  PLT 200 178 SPECIMEN CONTAMINATED, UNABLE TO PERFORM TEST(S). 167   Basic Metabolic Panel: Recent Labs  Lab 09/01/22 0943 09/02/22 0717 09/03/22 0956  NA 141 141 144  K 3.5 4.5 3.4*  CL 111 112* 117*  CO2 27 24 24   GLUCOSE 114* 137* 122*  BUN 17 17 16   CREATININE 0.79 0.77 0.65  CALCIUM 8.3* 7.9* 7.6*   GFR: Estimated Creatinine Clearance: 50.4 mL/min (by C-G formula based on SCr of 0.65  mg/dL). Liver Function Tests: Recent Labs  Lab 09/01/22 0943  AST 112*  ALT 37  ALKPHOS 64  BILITOT 1.1  PROT 6.3*  ALBUMIN 3.3*   No results for input(s): "LIPASE", "AMYLASE" in the last 168 hours. No results for input(s): "AMMONIA" in the last 168 hours. Coagulation Profile: Recent Labs  Lab 09/01/22 1450  INR 1.2   Cardiac Enzymes: No results for input(s): "CKTOTAL", "CKMB", "CKMBINDEX", "TROPONINI" in the last 168 hours. BNP (last 3 results) No results for input(s): "PROBNP" in the last 8760 hours. HbA1C: No results for input(s): "HGBA1C" in the last 72 hours. CBG: Recent Labs  Lab 09/01/22 0942  GLUCAP 112*   Lipid Profile: No results for input(s): "CHOL", "HDL", "LDLCALC", "TRIG", "CHOLHDL", "LDLDIRECT" in the last 72 hours. Thyroid Function Tests: No results for input(s): "TSH", "T4TOTAL", "FREET4", "T3FREE", "THYROIDAB" in the last 72 hours. Anemia Panel: Recent Labs    09/02/22 0414  TIBC 228*  IRON 10*   Sepsis Labs: Recent Labs  Lab 09/01/22 1620  LATICACIDVEN 0.9    Recent Results (from the past 240 hour(s))  Urine Culture     Status: Abnormal   Collection Time: 09/01/22  4:36 PM   Specimen: Urine, Clean Catch  Result Value Ref Range Status   Specimen Description   Final    URINE, CLEAN CATCH Performed at [facility], [address] [place], [place]    Special Requests   Final    NONE Performed at [facility], [address], [place], [place]    Culture (A)  Final    <10,000 COLONIES/mL INSIGNIFICANT GROWTH Performed at [facility], [address] [place], [address] [place]    Report Status 09/02/2022 FINAL  Final         Radiology Studies: DG FEMUR, MIN 2 VIEWS RIGHT  Result Date: 09/02/2022 CLINICAL DATA:  Status post ORIF right femoral fracture EXAM: RIGHT FEMUR 2 VIEWS COMPARISON:  09/01/2022 FINDINGS: Medullary rod is noted within the right femur with  proximal fixation screw traversing the femoral neck. Fracture fragments are in near anatomic alignment. IMPRESSION: Status post ORIF of proximal right femoral fracture. Electronically Signed   By: 11/02/2022 M.D.   On: 09/02/2022 01:08   DG FEMUR, MIN 2 VIEWS RIGHT  Result Date: 09/01/2022 CLINICAL DATA:  Intraoperative right IM nail EXAM: RIGHT FEMUR 2 VIEWS; DG C-ARM 1-60 MIN-NO REPORT COMPARISON:  Right femur/hip radiographs dated 09/01/2022 Fluoroscopy time: 42 seconds 7.02 mGy FINDINGS: Intraoperative fluoroscopic images during IM nail with dynamic hip screw fixation of an intertrochanteric right hip fracture. Displaced lesser trochanteric fragment. Fracture fragments are otherwise in near anatomic alignment. IMPRESSION: Intraoperative fluoroscopic images during ORIF of a right hip fracture, as above. Electronically Signed   By: 11/01/2022 M.D.   On: 09/01/2022 22:01   DG C-Arm 1-60 Min-No Report  Result Date: 09/01/2022 Fluoroscopy was utilized by the requesting physician.  No radiographic interpretation.        Scheduled  Meds:  sodium chloride   Intravenous Once   aspirin  81 mg Oral BID   Chlorhexidine Gluconate Cloth  6 each Topical Daily   citalopram  10 mg Oral Daily   docusate sodium  100 mg Oral BID   feeding supplement  237 mL Oral BID BM   multivitamin with minerals  1 tablet Oral Daily   OLANZapine  10 mg Oral QHS   Vitamin D (Ergocalciferol)  50,000 Units Oral Q7 days   Continuous Infusions:  sodium chloride 100 mL/hr at 09/03/22 0538   cefTRIAXone (ROCEPHIN)  IV 1 g (09/02/22 1713)   methocarbamol (ROBAXIN) IV 500 mg (09/03/22 0118)     LOS: 2 days    Time spent: 50 minutes spent on chart review, discussion with nursing staff, consultants, updating family and interview/physical exam; more than 50% of that time was spent in counseling and/or coordination of care.    Alvira Philips [place], DO [facility] Available via Epic secure chat 7am-7pm After  these hours, please refer to coverage provider listed on [email or web address] 09/03/2022, 1:23 PM

## 2022-09-03 NOTE — NC FL2 (Addendum)
[place] LEVEL OF CARE SCREENING TOOL     IDENTIFICATION  Patient Name: Nancy [name] Birthdate: [birth year] Sex: female Admission Date (Current Location): 09/01/2022  [facility] and [place] Number:  Herbalist and Address:  [facility],  [place], [place]      Provider Number: [id number]  Attending Physician Name and Address:  [place] Eric J, DO  Relative Name and Phone Number:  Aundria Jochimsen [phone]    Current Level of Care: Hospital Recommended Level of Care: [place], Memory Care Prior Approval Number:    Date Approved/Denied:   PASRR Number: [id number]  Discharge Plan: Other (Comment) (ALF/Memory Care)    Current Diagnoses: Patient Active Problem List   Diagnosis Date Noted   Protein-calorie malnutrition, severe 09/02/2022   Closed right hip fracture ([place]) 09/01/2022   SIRS (systemic inflammatory response syndrome) ([place]) 09/01/2022   Acute urinary retention 09/01/2022   Elevated LFTs 09/01/2022   Dysphagia 12/06/2021   Stage I pressure ulcer of sacral region 99991111   Acute metabolic encephalopathy 99991111   Aortic atherosclerosis ([place]) 12/06/2021   Pressure injury of skin 12/05/2021   Malnutrition of moderate degree 12/05/2021   Demand ischemia 12/04/2021   Thoracic compression fracture ([employer]) 12/04/2021   Hyperlipidemia    Hypertension    Dementia (HCC)    Thrombocytosis    Normocytic anemia    Mild protein malnutrition (HCC)     Orientation RESPIRATION BLADDER Height & Weight     Self  O2 Continent Weight: 110 lb 10.7 oz (50.2 kg) Height:  5\' 7"  (170.2 cm)  BEHAVIORAL SYMPTOMS/MOOD NEUROLOGICAL BOWEL NUTRITION STATUS      Incontinent Diet (Regular)  AMBULATORY STATUS COMMUNICATION OF NEEDS Skin   Extensive Assist Does not communicate Normal                       Personal Care Assistance Level of Assistance  Bathing, Feeding, Dressing Bathing Assistance:  Limited assistance Feeding assistance: Limited assistance Dressing Assistance: Limited assistance     Functional Limitations Info  Sight, Hearing, Speech Sight Info: Impaired Hearing Info: Impaired Speech Info: Impaired    SPECIAL CARE FACTORS FREQUENCY  PT (By licensed PT), OT (By licensed OT)     PT Frequency: 5x/wk OT Frequency: 5x/wk            Contractures Contractures Info: Not present    Additional Factors Info  Code Status, Allergies, Psychotropic Code Status Info: FULL Allergies Info: Cefdinir Psychotropic Info: See MAR         Current Medications (09/03/2022):  This is the current hospital active medication list Current Facility-Administered Medications  Medication Dose Route Frequency Provider Last Rate Last Admin   0.9 %  sodium chloride infusion   Intravenous Continuous Kyle, Tyrone A, DO 100 mL/hr at 09/03/22 0538 New Bag at 09/03/22 0538   acetaminophen (TYLENOL) tablet 325-650 mg  325-650 mg Oral Q6H PRN Magnant, Charles L, PA-C       aspirin chewable tablet 81 mg  81 mg Oral BID Magnant, Charles L, PA-C   81 mg at 09/02/22 2117   cefTRIAXone (ROCEPHIN) 1 g in sodium chloride 0.9 % 100 mL IVPB  1 g Intravenous Q24H [place] Donnamarie Poag, DO 200 mL/hr at 09/02/22 1713 1 g at 09/02/22 1713   Chlorhexidine Gluconate Cloth 2 % PADS 6 each  6 each Topical Daily [place] Eric J, DO   6 each at 09/02/22 1300   citalopram (  CELEXA) tablet 10 mg  10 mg Oral Daily [place] Donnamarie Poag, DO   10 mg at 09/02/22 1712   docusate sodium (COLACE) capsule 100 mg  100 mg Oral BID Magnant, Charles L, PA-C   100 mg at 09/02/22 2117   feeding supplement (ENSURE ENLIVE / ENSURE PLUS) liquid 237 mL  237 mL Oral BID BM [place] Eric J, DO   237 mL at 09/02/22 1402   HYDROcodone-acetaminophen (NORCO/VICODIN) 5-325 MG per tablet 1-2 tablet  1-2 tablet Oral Q6H PRN Marylyn Ishihara, Tyrone A, DO       menthol-cetylpyridinium (CEPACOL) lozenge 3 mg  1 lozenge Oral PRN Magnant, Charles L, PA-C       Or    phenol (CHLORASEPTIC) mouth spray 1 spray  1 spray Mouth/Throat PRN Magnant, Charles L, PA-C       methocarbamol (ROBAXIN) tablet 500 mg  500 mg Oral Q6H PRN Magnant, Charles L, PA-C       Or   methocarbamol (ROBAXIN) 500 mg in dextrose 5 % 50 mL IVPB  500 mg Intravenous Q6H PRN Magnant, Charles L, PA-C 110 mL/hr at 09/03/22 0118 500 mg at 09/03/22 0118   metoCLOPramide (REGLAN) tablet 5-10 mg  5-10 mg Oral Q8H PRN Magnant, Charles L, PA-C       Or   metoCLOPramide (REGLAN) injection 5-10 mg  5-10 mg Intravenous Q8H PRN Magnant, Charles L, PA-C       multivitamin with minerals tablet 1 tablet  1 tablet Oral Daily [place] Donnamarie Poag, DO   1 tablet at 09/02/22 1308   OLANZapine (ZYPREXA) tablet 10 mg  10 mg Oral QHS [place] Donnamarie Poag, DO   10 mg at 09/02/22 2117   ondansetron (ZOFRAN) tablet 4 mg  4 mg Oral Q6H PRN Magnant, Charles L, PA-C       Or   ondansetron (ZOFRAN) injection 4 mg  4 mg Intravenous Q6H PRN Magnant, Charles L, PA-C       oxyCODONE (Oxy IR/ROXICODONE) immediate release tablet 5 mg  5 mg Oral Q6H PRN [place] Eric J, DO       Vitamin D (Ergocalciferol) (DRISDOL) 1.25 MG (50000 UNIT) capsule 50,000 Units  50,000 Units Oral Q7 days Magnant, Gerrianne Scale, PA-C         Discharge Medications: Please see discharge summary for a list of discharge medications.  Relevant Imaging Results:  Relevant Lab Results:   Additional Information SS# [id number]  Vassie Moselle, LCSW

## 2022-09-03 NOTE — Progress Notes (Signed)
   09/03/22 1355  Assess: MEWS Score  Temp 98.3 F (36.8 C)  BP (!) 150/81  MAP (mmHg) 94  Pulse Rate (!) 121  Resp 18  SpO2 100 %  O2 Device Nasal Cannula  Patient Activity (if Appropriate) In bed  O2 Flow Rate (L/min) 2 L/min  Assess: MEWS Score  MEWS Temp 0  MEWS Systolic 0  MEWS Pulse 2  MEWS RR 0  MEWS LOC 0  MEWS Score 2  MEWS Score Color Yellow  Assess: if the MEWS score is Yellow or Red  Were vital signs taken at a resting state? Yes  Focused Assessment No change from prior assessment  Does the patient meet 2 or more of the SIRS criteria? Yes  Does the patient have a confirmed or suspected source of infection? Yes  Provider and Rapid Response Notified? No  MEWS guidelines implemented *See Row Information* No, previously yellow, continue vital signs every 4 hours  Treat  MEWS Interventions Administered prn meds/treatments  Pain Scale Faces  Pain Score 0  Faces Pain Scale 0  Take Vital Signs  Increase Vital Sign Frequency  Yellow: Q 2hr X 2 then Q 4hr X 2, if remains yellow, continue Q 4hrs  Escalate  MEWS: Escalate Yellow: discuss with charge nurse/RN and consider discussing with provider and RRT  Notify: Charge Nurse/RN  Name of Charge Nurse/RN Notified Damin Salido RN  Date Charge Nurse/RN Notified 09/03/22  Time Charge Nurse/RN Notified 0700  Notify: Provider  Provider Name/Title [place]  Date Provider Notified 09/03/22  Time Provider Notified 1355  Method of Notification Page  Notification Reason Other (Comment) (update)  Provider response See new orders  Date of Provider Response 09/03/22  Time of Provider Response 1400  Notify: Rapid Response  Name of Rapid Response RN Notified  (no)  Document  Patient Outcome Stabilized after interventions  Progress note created (see row info) Yes  Assess: SIRS CRITERIA  SIRS Temperature  0  SIRS Pulse 1  SIRS Respirations  0  SIRS WBC 1  SIRS Score Sum  2

## 2022-09-03 NOTE — Plan of Care (Signed)

## 2022-09-03 NOTE — Progress Notes (Signed)
OT Cancellation Note  Patient Details Name: Nancy [name] MRN: [id number] DOB: 08-23-1951   Cancelled Treatment:    Reason Eval/Treat Not Completed: Other (comment) Pt very confused and getting blood this day.  Will check on pt next day.  May not be appropriate for OT.  Kari Baars, [place]  [phone], Edwena Felty D 09/03/2022, 12:55 PM

## 2022-09-03 NOTE — Progress Notes (Signed)
WL 150[address] [facility]) Hospitalized Hospice Patient    Nancy [name] is a current hospice patient with terminal diagnosis of Severe protien calorie malnutrition and dementia.  Patient had two falls at her facility Friday 9/29.  After hours [facility] nurse called to assess patient and found patient's right leg to be swollen, externally rotated and shortened compared to left.  Decision was made to have further evaluation at the ED.  Hospice was notified prior to sending patient to the hospital.  This is a related hospital admission per Dr. Alphonsus Sias, [facility] MD.    Visited patient at bedside. Patient was alert but disoriented. She did have on mittens. Report exchanged with bedside RN. Patient had drop in Hgb and will receive 1 unit of PRBC. Tried to reach son by phone, left voicemail.   At this time patient is inpatient appropriate as she is receiving IV fluids, Rocephin 1g IVPB, 1 unit PRBC, and IM nailing. No PRNs given.   VS:150/81, 121, 18, 100%NC2L  I&O:238/0?  Labs:  Latest Reference Range & Units 09/03/22 09:56  BASIC METABOLIC PANEL  Rpt !  Sodium 135 - 145 mmol/L 144  Potassium 3.5 - 5.1 mmol/L 3.4 (L)  Chloride 98 - 111 mmol/L 117 (H)  CO2 22 - 32 mmol/L 24  Glucose 70 - 99 mg/dL 710 (H)  BUN 8 - 23 mg/dL 16  Creatinine 6.26 - 9.48 mg/dL 5.46  Calcium 8.9 - 27.0 mg/dL 7.6 (L)  Anion gap 5 - 15  3 (L)  GFR, Estimated >60 mL/min >60  WBC 4.0 - 10.5 K/uL 9.5  RBC 3.87 - 5.11 MIL/uL 2.51 (L)  Hemoglobin 12.0 - 15.0 g/dL 7.4 (L)  HCT 35.0 - 09.3 % 23.1 (L)  MCV 80.0 - 100.0 fL 92.0  MCH 26.0 - 34.0 pg 29.5  MCHC 30.0 - 36.0 g/dL 81.8  RDW 29.9 - 37.1 % 14.2  Platelets 150 - 400 K/uL 167  nRBC 0.0 - 0.2 % 0.0  RBC. 3.87 - 5.11 MIL/uL 2.46 (L)  Retic Ct Pct 0.4 - 3.1 % 1.4  Retic Count, Absolute 19.0 - 186.0 K/uL 34.9  Immature Retic Fract 2.3 - 15.9 % 11.9  !: Data is abnormal (L): Data is abnormally low (H): Data is abnormally high  Diagnostics:  DG FEMUR 2View:  Proximal Right femur fracture with displacement DG Chest: new posterior right fourth through seventh ribs indeterminate fractures  IV/PRN:  cefTRIAXone (ROCEPHIN) 1 g in sodium chloride 0.9 % 100 mL IVPB Dose: 1 g 0.9 % sodium chloride infusion Rate: 100 mL/hr Freq: Continuous Route: IV Start: 09/01/22 1545 acetaminophen (TYLENOL) tablet 325-650 mg Dose: 325-650 mg Freq: Every 6 hours PRN Route: PO HYDROcodone-acetaminophen (NORCO/VICODIN) 5-325 MG per tablet 1-2 tablet Dose: 1-2 tablet Freq: Every 6 hours PRN Route: PO  Problem List: Right hip fracture s/p ORIF IMN Patient presenting from SNF following fall 2 days prior to admission with patient on will to bear weight to right lower extremity.  Right hip/pelvis x-ray with acute injury generic proximal right femoral fracture with mild displacement, moderate varus angulation and moderate to high-grade impaction.  Orthopedics was consulted and patient underwent ORIF with IMN on 09/01/2022 by Dr. August Saucer. -- Orthopedics following, appreciate assistance -- Vitamin D 25-hydroxy level: Pending -- Aspirin 81 mg p.o. twice daily for DVT prophylaxis per orthopedics -- Tylenol 1-2 tablets every 6 hours as needed moderate pain -- Oxycodone 5-10 mg p.o. every 4 hours as needed severe pain --TDWB RLE -- Pending PT/OT evaluation  Postoperative blood loss anemia --Hgb 6.5 this am; down from 8.3 yesterday.  --Check anemia panel --transfuse 2 unit PRBCs, repeat H&H 2 hours following transfusion --CBC in a.m.   Vitamin D deficiency Vitamin D 25-hydroxy level low at 22.  Started on vitamin D 50,000 units q. 7 days   Hypokalemia Potassium 3.4, will replete. --Repeat electrolytes in a.m. to include magnesium   UTI Urinary retention Patient was noted to have urinary retention with a residual greater than 400 mL.  Foley catheter was attempted multiple times by ED staff and unsuccessful.  Urology was consulted and patient underwent Foley catheter  placement by Dr. Gloriann Loan on 09/01/2022.  Urinalysis with negative nitrite, negative leukocytes, no bacteria 0-5 WBCs.  Urine culture with less than 10K insignificant growth.  Completed 3-day course of ceftriaxone. --Continue Foley catheter for 5 days or more, urology recommended to continue until she is able to mobilize more prior to consideration of voiding trial per urology   Rib fracture, subacute to chronic Incidentally noted on chest x-ray, minimal angulation posterior right fourth through seventh ribs which are age-indeterminate likely subacute or older with no pneumothorax. --Supportive care, pain control with Tylenol, incentive spirometry   Anemia of chronic medical disease Hemoglobin 8.3 with normal MCV.  Iron low at 10 but TIBC also low at 228 which is consistent with anemia of chronic disease. --CBC daily, goal hemoglobin greater than 7.0.   Essential hypertension Hyperlipidemia Currently not on antihypertensive or cholesterol medicine outpatient.  BP 108/58 this morning, well controlled. -- Continue monitor BP closely   Depression: Continue Celexa   Advanced dementia --Delirium precautions --Get up during the day --Encourage a familiar face to remain present throughout the day --Keep blinds open and lights on during daylight hours --Minimize the use of opioids/benzodiazepines --Continue home Zyprexa 10 mg p.o. nightly  DC Planning: Return back to [facility] once medically stable-potentially 10/4. Liaison ordered Rolling walker to be delivered to facility.  IDT: Updated  GOC: FULL, PT continued, IM nailing of fx.  Family: Left VM for son Rodman Key. No return call at this time  Clementeen Hoof, [place], [facility] Liaison [phone]

## 2022-09-03 NOTE — Progress Notes (Signed)
Per lab, critical lab result - Hgb. 6.5. Notified MD and assigned nurse.

## 2022-09-03 NOTE — Progress Notes (Signed)
   09/02/22 1954  Assess: MEWS Score  Temp 98.7 F (37.1 C)  BP (!) 97/51  MAP (mmHg) (!) 62  Pulse Rate (!) 127  Resp 20  SpO2 96 %  O2 Device Room Air  Assess: MEWS Score  MEWS Temp 0  MEWS Systolic 1  MEWS Pulse 2  MEWS RR 0  MEWS LOC 0  MEWS Score 3  MEWS Score Color Yellow  Assess: if the MEWS score is Yellow or Red  Were vital signs taken at a resting state? Yes  Focused Assessment No change from prior assessment  Does the patient meet 2 or more of the SIRS criteria? Yes  Does the patient have a confirmed or suspected source of infection? Yes  Provider and Rapid Response Notified? Yes  MEWS guidelines implemented *See Row Information* Yes  Treat  MEWS Interventions Escalated (See documentation below)  Take Vital Signs  Increase Vital Sign Frequency  Yellow: Q 2hr X 2 then Q 4hr X 2, if remains yellow, continue Q 4hrs  Escalate  MEWS: Escalate Yellow: discuss with charge nurse/RN and consider discussing with provider and RRT  Notify: Charge Nurse/RN  Name of Charge Nurse/RN Notified Tom RN  Date Charge Nurse/RN Notified 09/02/22  Notify: Provider  Provider Name/Title Olena Heckle NP  Date Provider Notified 09/02/22  Time Provider Notified 2001  Method of Notification Page  Notification Reason Change in status  Provider response See new orders  Document  Patient Outcome Other (Comment) (remains on unit)  Assess: SIRS CRITERIA  SIRS Temperature  0  SIRS Pulse 1  SIRS Respirations  0  SIRS WBC 1  SIRS Score Sum  2

## 2022-09-03 NOTE — TOC Progression Note (Addendum)
Transition of Care [facility]) - Progression Note    Patient Details  Name: Nancy [name] MRN: [id number] Date of Birth: 12-03-1950  Transition of Care [facility]) CM/SW [place], [place] Phone Number: 09/03/2022, 12:33 PM  Clinical Narrative:    CSW spoke with [facility] who will be ordering a rolling walker for this pt. [facility] is able to provide limited PT services for pt when she returns to [facility].  CSW left voicemail for pt's son to discuss discharge plans.   Update 2:20pm- CSW received return call from pt's son. Pt's son is interested in pt going to SNF prior to returning to [facility]. CSW discussed with son that as pt is active w/ hospice insurance will not cover both and they would have to private pay for SNF. Pt's son seemed confused on hospice services and stated he did not believe she was getting full hospice services. CSW let him know that according to [facility] pt is active in hospice services. Pt's son asked whether he could cancel hospice services and for pt to go to SNF instead. CSW encouraged pt to speak with our palliative care team to discuss [place] prior to making a decision of hospice vs SNF. Pt's son was agreeable to this. MD notified for palliative care consult.    Expected Discharge Plan: Memory Care Barriers to Discharge: Continued Medical Work up  Expected Discharge Plan and Services Expected Discharge Plan: Memory Care In-house Referral: NA Discharge Planning Services: CM Consult Post Acute Care Choice: Resumption of Svcs/PTA Provider Living arrangements for the past 2 months: [place]                 DME Arranged: N/A DME Agency: NA         HH Agency: [facility] [place] Determinants of Health (SDOH) Interventions    Readmission Risk Interventions    09/02/2022    1:07 PM  Readmission Risk Prevention Plan  Transportation Screening Complete  PCP or Specialist  Appt within 5-7 Days Complete  Home Care Screening Complete  Medication Review (RN CM) Complete

## 2022-09-04 DIAGNOSIS — S72001A Fracture of unspecified part of neck of right femur, initial encounter for closed fracture: Secondary | ICD-10-CM

## 2022-09-04 LAB — MAGNESIUM: Magnesium: 2 mg/dL (ref 1.7–2.4)

## 2022-09-04 LAB — TYPE AND SCREEN
ABO/RH(D): A POS
Antibody Screen: NEGATIVE
Unit division: 0
Unit division: 0

## 2022-09-04 LAB — BASIC METABOLIC PANEL
Anion gap: 7 (ref 5–15)
BUN: 11 mg/dL (ref 8–23)
CO2: 23 mmol/L (ref 22–32)
Calcium: 7.6 mg/dL — ABNORMAL LOW (ref 8.9–10.3)
Chloride: 107 mmol/L (ref 98–111)
Creatinine, Ser: 0.61 mg/dL (ref 0.44–1.00)
GFR, Estimated: >60 mL/min (ref >60)
Glucose, Bld: 95 mg/dL (ref 70–99)
Potassium: 3.9 mmol/L (ref 3.5–5.1)
Sodium: 137 mmol/L (ref 135–145)

## 2022-09-04 LAB — BPAM RBC
Blood Product Expiration Date: 202310292359
Blood Product Expiration Date: 202310292359
ISSUE DATE / TIME: 202310031000
ISSUE DATE / TIME: 202310031355
Unit Type and Rh: 6200
Unit Type and Rh: 6200

## 2022-09-04 LAB — CBC
HCT: 30.2 % — ABNORMAL LOW (ref 36.0–46.0)
Hemoglobin: 9.8 g/dL — ABNORMAL LOW (ref 12.0–15.0)
MCH: 30 pg (ref 26.0–34.0)
MCHC: 32.5 g/dL (ref 30.0–36.0)
MCV: 92.4 fL (ref 80.0–100.0)
Platelets: 170 10*3/uL (ref 150–400)
RBC: 3.27 MIL/uL — ABNORMAL LOW (ref 3.87–5.11)
RDW: 14.5 % (ref 11.5–15.5)
WBC: 8.9 10*3/uL (ref 4.0–10.5)
nRBC: 0 % (ref 0.0–0.2)

## 2022-09-04 MED ORDER — LOPERAMIDE HCL 2 MG PO CAPS: 2.0000 mg | ORAL_CAPSULE | ORAL | Status: DC | PRN

## 2022-09-04 NOTE — Progress Notes (Signed)
Physical Therapy Treatment Patient Details Name: Nancy [name] MRN: [id number] DOB: 07-23-1951 Today's Date: 09/04/2022   History of Present Illness Patient is 71 y.o. female presented on 09/01/2022 from [facility] SNF after a fall. Pt noted to have a right hip fracture and no s/p ORIF on 09/01/2022. PMH significant for dementia, essential hypertension, hyperlipidemia.    PT Comments    Patient lethargic at start but very willing to work with therapy once awake. Pt followed cues to reach for bed rail but required Max Assist+2 to pivot hips and raise trunk to sit up EOB.  Pt initially required Max assist for seated balance but progressed to min guard to mod assist as pt sat for longer duration. She demonstrated good initiation for power up from EOB to stand with RW and 2+ min assist, pt unable to maintain NWB on Rt LE and unsure if pt only TDWB vs PWB. Max +2 utilized to stand and pivot bed>recliner. Sling pad in place for lift and RN notified of transfer technique. Pt positioned in chair with towel rolls and pillows to deter anterior scooting. She will benefit from skilled PT in SNF setting prior to return to memory care facility.    Recommendations for follow up therapy are one component of a multi-disciplinary discharge planning process, led by the attending physician.  Recommendations may be updated based on patient status, additional functional criteria and insurance authorization.  Follow Up Recommendations  Skilled nursing-short term rehab (<3 hours/day) Can patient physically be transported by private vehicle: No   Assistance Recommended at Discharge Frequent or constant Supervision/Assistance  Patient can return home with the following Two people to help with walking and/or transfers;Two people to help with bathing/dressing/bathroom;Assistance with cooking/housework;Direct supervision/assist for medications management;Assist for transportation;Help with stairs or ramp for  entrance   Equipment Recommendations  Rolling walker (2 wheels)    Recommendations for Other Services       Precautions / Restrictions Precautions Precautions: Fall Restrictions Weight Bearing Restrictions: Yes RLE Weight Bearing: Non weight bearing Other Position/Activity Restrictions: TDWB for pivot transfers with RW only     Mobility  Bed Mobility Overal bed mobility: Needs Assistance Bed Mobility: Supine to Sit     Supine to sit: Max assist, +2 for physical assistance, HOB elevated, +2 for safety/equipment     General bed mobility comments: Max+2 with use of bed pad to raise trunk and pivot to EOB, assist to bring LE's off side. pt able to follow cues to reach for bed rail.    Transfers Overall transfer level: Needs assistance Equipment used: Rolling walker (2 wheels) Transfers: Sit to/from Stand, Bed to chair/wheelchair/BSC Sit to Stand: Min assist, +2 physical assistance, +2 safety/equipment, From elevated surface Stand pivot transfers: Max assist, +2 physical assistance, +2 safety/equipment         General transfer comment: Pt able to initaite power up with min assist and RW with +2 for support/safety. Pt unable to maintain NWB on Rt LE and unclear if maintained TDWB however she did have decrease weight shift Rt. [facility] Max assist provided to stand and pivot bed>chair as pt is unablet o maintain TDWB with RW for pivot transfers.    Ambulation/Gait                   Stairs             Wheelchair Mobility    Modified Rankin (Stroke Patients Only)       Balance Overall balance assessment: Needs  assistance   Sitting balance-Leahy Scale: Poor Sitting balance - Comments: Max assist at start fluctuating between min guard-mod assist as pt sat up for greater time Postural control: Posterior lean   Standing balance-Leahy Scale: Zero Standing balance comment: Max +2                            Cognition Arousal/Alertness: Lethargic  (initially lethargic, however more arousable with verbal/tactile cues and once sitting EOB) Behavior During Therapy: Flat affect Overall Cognitive Status: History of cognitive impairments - at baseline                                 General Comments: Required increased time, consistent tactile cues, and repetition of prompts for simple 1 step command follow, delayed initiation, suspected cognitive processing deficits, required significant assist for sequencing and problem solving        Exercises      General Comments        Pertinent Vitals/Pain Pain Assessment Pain Assessment: PAINAD Breathing: normal Negative Vocalization: none Facial Expression: smiling or inexpressive Body Language: tense, distressed pacing, fidgeting Consolability: distracted or reassured by voice/touch PAINAD Score: 2 Pain Location: Patient reported having "a little" pain, however she did not specify where her pain was located. Pain Intervention(s): Limited activity within patient's tolerance, Monitored during session, Repositioned    Home Living Family/patient expects to be discharged to:: Other (Comment) ([place])                        Prior Function            PT Goals (current goals can now be found in the care plan section) Acute Rehab PT Goals PT Goal Formulation: With family Time For Goal Achievement: 09/16/22 Potential to Achieve Goals: Fair Progress towards PT goals: Progressing toward goals    Frequency    Min 3X/week      PT Plan Current plan remains appropriate    Co-evaluation PT/OT/SLP Co-Evaluation/Treatment: Yes Reason for Co-Treatment: Complexity of the patient's impairments (multi-system involvement);For patient/therapist safety;To address functional/ADL transfers PT goals addressed during session: Mobility/safety with mobility;Balance OT goals addressed during session: ADL's and self-care      AM-PAC PT "6 Clicks"  Mobility   Outcome Measure  Help needed turning from your back to your side while in a flat bed without using bedrails?: Total Help needed moving from lying on your back to sitting on the side of a flat bed without using bedrails?: Total Help needed moving to and from a bed to a chair (including a wheelchair)?: Total Help needed standing up from a chair using your arms (e.g., wheelchair or bedside chair)?: Total Help needed to walk in hospital room?: Total Help needed climbing 3-5 steps with a railing? : Total 6 Click Score: 6    End of Session Equipment Utilized During Treatment: Gait belt Activity Tolerance: Patient tolerated treatment well Patient left: in chair;with call bell/phone within reach;with chair alarm set;with family/visitor present Nurse Communication: Mobility status;Need for lift equipment (lift pad in chair vs 2+ Max assist) PT Visit Diagnosis: Muscle weakness (generalized) (M62.81);Other abnormalities of gait and mobility (R26.89);Unsteadiness on feet (R26.81);Difficulty in walking, not elsewhere classified (R26.2);Pain Pain - Right/Left: Right Pain - part of body: Hip     Time: 0762-2633 PT Time Calculation (min) (ACUTE ONLY): 44 min  Charges:  $  Therapeutic Activity: 23-37 mins                     Wynn Maudlin, DPT Acute Rehabilitation Services Office [phone]  09/04/22 3:26 PM

## 2022-09-04 NOTE — Progress Notes (Signed)
PROGRESS NOTE Nancy [name]  [id number] DOB: 04-21-1951 DOA: 09/01/2022 PCP: Pcp, No   Brief Narrative/Hospital Course: 62F w/ Hx dementia, HTN, HLD admit with altered mental status and a fall with right hip fracture s/p ORIF IMN 10/1 Marlou Sa).  Patient also had questionable UTI treated with Ceftriaxone and admitted.  Patient is a currently hospice patient with diagnosis of severe protein calorie malnutrition and dementia. Palliative care was consulted family/patient rescinded hospice and planning for rehab.  Initial work-up with CT head, CT C-spine chest x-ray in the ED along with right hip pelvis x-ray showing right femoral fracture.    Subjective: Seen and examined this morning, alert awake able to tell me her name no other complaints, with baseline dementia.  On [place] oxygen.   Assessment and Plan: Principal Problem:   Closed right hip fracture (HCC) Active Problems:   Acute metabolic encephalopathy   Hyperlipidemia   Hypertension   Dementia (HCC)   Normocytic anemia   SIRS (systemic inflammatory response syndrome) (HCC)   Acute urinary retention   Elevated LFTs   Protein-calorie malnutrition, severe   Hypokalemia   Acute postoperative anemia due to expected blood loss   Right hip fracture secondary to fall status post ORIF 10/1.  Continue to replete vitamin D continue aspirin twice daily for DVT prophylaxis continue Tylenol oxycodone for pain control.  TTWB RLE, PT OT per orthopedics  Goals of care Advanced dementia Severe Protein malnutrition Currently a hospice patient: Continue supportive care delirium precaution Zyprexa minimize opiates and benzodiazepines.  PT OT consult family wants to removing from hospice to consider for SNF.  Currently full code.  Palliative care/[facility] care hospice following await further discussion for disposition  Rib fracture incidentally noted-minimal angulation posterior right fourth through seventh ribs which are age-indeterminate likely  subacute or older with no pneumothorax.Supportive care, pain control with Tylenol, incentive spirometry  Postoperative acute blood loss anemia Anemia of chronic disease:Keep hemoglobin above 7 g.  Treatment as below Recent Labs  Lab 09/02/22 0717 09/03/22 0555 09/03/22 0956 09/03/22 1757 09/04/22 0556  HGB 8.3* SPECIMEN CONTAMINATED, UNABLE TO PERFORM TEST(S). 7.4* 9.7* 9.8*  HCT 26.0* SPECIMEN CONTAMINATED, UNABLE TO PERFORM TEST(S). 23.1* 30.6* 30.2*    Vitamin D deficiency at 22-started weekly replacement high-dose Hypokalemia repleted UTI/urine retention: More than 400 cc unsuccessful Foley attempts in the ED, urology consulted Dr. Gloriann Loan placed Foley 10/1 completed 3 days of antibiotics, continue Foley catheter  at least for 5 days and continue until she is able to mobilize more prior to voiding trial  Hypertension/HLD: BP stable not needing meds.  Not on a statin  DVT prophylaxis: SCDs Start: 09/01/22 2330 Place TED hose Start: 09/01/22 2330 SCDs Start: 09/01/22 1845 Code Status:   Code Status: Full Code Family Communication: plan of care discussed with patient at bedside. Patient status is: Inpatient because of hip fracture Level of care: Telemetry   Dispo: The patient is from: Hospice            Anticipated disposition: TBD-possible SNF   Palliative care/[facility] care hospice following await further discussion for disposition  Objective: Vitals last 24 hrs: Vitals:   09/03/22 1625 09/03/22 2046 09/04/22 0003 09/04/22 0420  BP: 137/64 (!) 143/62 129/69 (!) 144/78  Pulse: (!) 110 (!) 106 (!) 102 82  Resp: (!) 22 18 20 16   Temp: 98.6 F (37 C) 98.4 F (36.9 C) 98.2 F (36.8 C) 97.7 F (36.5 C)  TempSrc: Oral Oral Oral Oral  SpO2: 100% 100% 99% 100%  Weight:      Height:       Weight change:   Physical Examination: General exam: alert awake oriented x1 HEENT:Oral mucosa moist, Ear/Nose WNL grossly, dentition normal. Respiratory system: bilaterally diminished  BS, no use of accessory muscle Cardiovascular system: S1 & S2 +, No JVD. Gastrointestinal system: Abdomen soft,NT,ND, BS+ Nervous System:Alert, awake, moving extremities and grossly nonfocal Extremities: Right hip surgical site with Aquacel dressing intact  Skin: No rashes,no icterus. MSK: Normal muscle bulk,tone, power  Medications reviewed:  Scheduled Meds:  aspirin  81 mg Oral BID   Chlorhexidine Gluconate Cloth  6 each Topical Daily   citalopram  10 mg Oral Daily   docusate sodium  100 mg Oral BID   feeding supplement  237 mL Oral BID BM   multivitamin with minerals  1 tablet Oral Daily   OLANZapine  10 mg Oral QHS   Vitamin D (Ergocalciferol)  50,000 Units Oral Q7 days   Continuous Infusions:  sodium chloride 100 mL/hr at 09/04/22 0804   methocarbamol (ROBAXIN) IV 500 mg (09/03/22 0118)      Diet Order             Diet regular Room service appropriate? Yes; Fluid consistency: Thin  Diet effective now                    Nutrition Problem: Severe Malnutrition Etiology: chronic illness (dementia) Signs/Symptoms: severe fat depletion, severe muscle depletion Interventions: Ensure Enlive (each supplement provides 350kcal and 20 grams of protein), MVI   Intake/Output Summary (Last 24 hours) at 09/04/2022 1053 Last data filed at 09/04/2022 0900 Gross per 24 hour  Intake 2573.81 ml  Output 2350 ml  Net 223.81 ml   Net IO Since Admission: 3,731.76 mL [09/04/22 1053]  Wt Readings from Last 3 Encounters:  09/01/22 50.2 kg  01/13/22 55 kg  12/06/21 55.2 kg     Unresulted Labs (From admission, onward)     Start     Ordered   09/01/22 1337  SARS Coronavirus 2 by RT PCR (hospital order, performed in [place] hospital lab) *cepheid single result test* Anterior Nasal Swab  (Tier 2 - Symptomatic/Asymptomatic)  Once,   URGENT        09/01/22 1336          Data Reviewed: I have personally reviewed following labs and imaging studies CBC: Recent Labs  Lab  09/01/22 0943 09/02/22 0717 09/03/22 0555 09/03/22 0956 09/03/22 1757 09/04/22 0556  WBC 13.4* 12.4* SPECIMEN CONTAMINATED, UNABLE TO PERFORM TEST(S). 9.5  --  8.9  NEUTROABS 11.2*  --   --   --   --   --   HGB 9.1* 8.3* SPECIMEN CONTAMINATED, UNABLE TO PERFORM TEST(S). 7.4* 9.7* 9.8*  HCT 27.9* 26.0* SPECIMEN CONTAMINATED, UNABLE TO PERFORM TEST(S). 23.1* 30.6* 30.2*  MCV 90.6 92.9 SPECIMEN CONTAMINATED, UNABLE TO PERFORM TEST(S). 92.0  --  92.4  PLT 200 178 SPECIMEN CONTAMINATED, UNABLE TO PERFORM TEST(S). 167  --  [id number]   Basic Metabolic Panel: Recent Labs  Lab 09/01/22 0943 09/02/22 0717 09/03/22 0956 09/04/22 0556  NA 141 141 144 137  K 3.5 4.5 3.4* 3.9  CL 111 112* 117* 107  CO2 27 24 24 23   GLUCOSE 114* 137* 122* 95  BUN 17 17 16 11   CREATININE 0.79 0.77 0.65 0.61  CALCIUM 8.3* 7.9* 7.6* 7.6*  MG  --   --   --  2.0   GFR: Estimated Creatinine Clearance: 50.4 mL/min (  by C-G formula based on SCr of 0.61 mg/dL). Liver Function Tests: Recent Labs  Lab 09/01/22 0943  AST 112*  ALT 37  ALKPHOS 64  BILITOT 1.1  PROT 6.3*  ALBUMIN 3.3*   No results for input(s): "LIPASE", "AMYLASE" in the last 168 hours. No results for input(s): "AMMONIA" in the last 168 hours. Coagulation Profile: Recent Labs  Lab 09/01/22 1450  INR 1.2   BNP (last 3 results) No results for input(s): "PROBNP" in the last 8760 hours. HbA1C: No results for input(s): "HGBA1C" in the last 72 hours. CBG: Recent Labs  Lab 09/01/22 0942  GLUCAP 112*   Lipid Profile: No results for input(s): "CHOL", "HDL", "LDLCALC", "TRIG", "CHOLHDL", "LDLDIRECT" in the last 72 hours. Thyroid Function Tests: No results for input(s): "TSH", "T4TOTAL", "FREET4", "T3FREE", "THYROIDAB" in the last 72 hours. Sepsis Labs: Recent Labs  Lab 09/01/22 1620  LATICACIDVEN 0.9    Recent Results (from the past 240 hour(s))  Urine Culture     Status: Abnormal   Collection Time: 09/01/22  4:36 PM   Specimen: Urine,  Clean Catch  Result Value Ref Range Status   Specimen Description   Final    URINE, CLEAN CATCH Performed at [facility], [place] [address] [place], [place] [address]    Special Requests   Final    NONE Performed at [facility], [place] [name] [place], [place] [address]    Culture (A)  Final    <10,000 COLONIES/mL INSIGNIFICANT GROWTH Performed at [place] [address], [place], [place] [address]    Report Status 09/02/2022 FINAL  Final    Antimicrobials: Anti-infectives (From admission, onward)    Start     Dose/Rate Route Frequency Ordered Stop   09/02/22 0030  ceFAZolin (ANCEF) IVPB 2g/100 mL premix        2 g 200 mL/hr over 30 Minutes Intravenous Every 8 hours 09/01/22 2329 09/02/22 1048   09/01/22 1545  cefTRIAXone (ROCEPHIN) 1 g in sodium chloride 0.9 % 100 mL IVPB        1 g 200 mL/hr over 30 Minutes Intravenous Every 24 hours 09/01/22 1538 09/03/22 1650      Culture/Microbiology    Component Value Date/Time   SDES  09/01/2022 1636    URINE, CLEAN CATCH Performed at [facility], [place] [address] [place], [place] [address]    SPECREQUEST  09/01/2022 1636    NONE Performed at [facility], [place] [address], [place], [place] [address]    CULT (A) 09/01/2022 1636    <10,000 COLONIES/mL INSIGNIFICANT GROWTH Performed at [place] [address] [place], [place] [address]    REPTSTATUS 09/02/2022 FINAL 09/01/2022 1636    Other culture-see note  Radiology Studies: No results found.   LOS: 3 days   Antonieta Pert, MD [facility]  09/04/2022, 10:53 AM

## 2022-09-04 NOTE — Hospital Course (Addendum)
50F w/ Hx dementia, HTN, HLD admit with altered mental status and a fall with right hip fracture s/p ORIF IMN 10/1 Marlou Sa).  Patient also had questionable UTI treated with Ceftriaxone and admitted.  Patient is a currently hospice patient with diagnosis of severe protein calorie malnutrition and dementia. Palliative care was consulted family/patient rescinded hospice and planning for rehab.  Initial work-up with CT head, CT C-spine chest x-ray in the ED along with right hip pelvis x-ray showing right femoral fracture.

## 2022-09-04 NOTE — Evaluation (Signed)
Occupational Therapy Evaluation Patient Details Name: Nancy [name] MRN: [id number] DOB: 09-09-1951 Today's Date: 09/04/2022   History of Present Illness Patient is 71 y.o. female presented on 09/01/2022 from [facility] SNF after a fall. Pt noted to have a right hip fracture and no s/p ORIF on 09/01/2022. She further presented with post-op blood loss anemia s/p transfusion. PMH significant for dementia, essential hypertension, hyperlipidemia.   Clinical Impression      Patient initially presented with noted lethargy, however she became appropriately aroused after verbal and tactile cues were provided. She required max assist x2 for supine to sit, total assist for lower body dressing, and max hand-over-hand assist for feeding seated EOB. She initially required max assist to maintain sitting edge of bed, however she progressed to needing min guard assist. She was unable to recall R LE weight-bearing restrictions and she was also unable to maintain appropriate weight-bearing in standing. Overall, she required increased assist and increased cues for problem-solving, sequencing, and recall. She could follow simple 1 step commands, however needed increased time, intermittent repetition and occasional tactile cues. She will benefit from further OT services to facilitate improved ADL participation and to decrease the risk for further functional decline.    Recommendations for follow up therapy are one component of a multi-disciplinary discharge planning process, led by the attending physician.  Recommendations may be updated based on patient status, additional functional criteria and insurance authorization.   Follow Up Recommendations  Skilled nursing-short term rehab (<3 hours/day)    Assistance Recommended at Discharge Frequent or constant Supervision/Assistance  Patient can return home with the following A lot of help with bathing/dressing/bathroom;A lot of help with walking and/or  transfers    Functional Status Assessment  Patient has had a recent decline in their functional status and demonstrates the ability to make significant improvements in function in a reasonable and predictable amount of time.  Equipment Recommendations  None recommended by OT       Precautions / Restrictions Precautions Precautions: Fall Restrictions Weight Bearing Restrictions: Yes RLE Weight Bearing: Non weight bearing Other Position/Activity Restrictions: TDWB for pivot transfers with RW only      Mobility Bed Mobility Overal bed mobility: Needs Assistance Bed Mobility: Supine to Sit     Supine to sit: Max assist, +2 for physical assistance          Transfers Overall transfer level: Needs assistance Equipment used: Rolling walker (2 wheels) Transfers: Sit to/from Stand Sit to Stand: +2 physical assistance, From elevated surface, Min assist           General transfer comment: pt able to stand with min assist x2, however pt was unable to maintain R LE weight-bearing status & subsequently required max assist x2 to stand-pivot to the bedside chair      Balance Overall balance assessment: Needs assistance   Sitting balance-Leahy Scale: Poor Sitting balance - Comments: initially she required max assist, however she progressed to requiring min guard assist     Standing balance-Leahy Scale: Poor Standing balance comment: max assist x2 for dynamic standing balance           ADL either performed or assessed with clinical judgement   ADL   Eating/Feeding: Maximal assistance;Sitting;Cueing for sequencing Eating/Feeding Details (indicate cue type and reason): Required max hand-over-hand assist with increased overall cues for initiation, scooping food, and bringing food to mouth. Performed with pt seated EOB Grooming: Wash/dry face;Maximal assistance;Cueing for sequencing Grooming Details (indicate cue type and reason): Required max  hand-over-hand assist for face  washing in bed         Upper Body Dressing : Total assistance   Lower Body Dressing: Total assistance       Toileting- Clothing Manipulation and Hygiene: Total assistance               Vision   Additional Comments: She required occasional cues for visual orientation to items, however it is not certain what her true baseline visual abilities are; she would occasionally present with a fixed gaze            Pertinent Vitals/Pain Pain Assessment Pain Location: Patient reported having "a little" pain, however she did not specify where her pain was located. Pain Intervention(s): Limited activity within patient's tolerance, Repositioned     Hand Dominance Right   Extremity/Trunk Assessment Upper Extremity Assessment Upper Extremity Assessment:  (Difficult to assess in detail given her impaired cognition. She largely required AAROM for B UE & she appeared to demo at least gross functional grasp of her hands bilaterally)   Lower Extremity Assessment Lower Extremity Assessment: Generalized weakness       Communication Communication Communication: Receptive difficulties   Cognition Arousal/Alertness: Lethargic (initially lethargic, however more arousable once stimulated with verbal and tactile cues)   Overall Cognitive Status: History of cognitive impairments - at baseline      General Comments: Required increased time, consistent tactile cues, and repetition of prompts for simple 1 step command follow, delayed initiation, suspected cognitive processing deficits, required significant assist for sequencing and problem solving                Home Living Family/patient expects to be discharged to:: Other (Comment) [facility])          Prior Functioning/Environment Prior Level of Function : Needs assist  Cognitive Assist : ADLs (cognitive);Mobility (cognitive)           Mobility Comments: mobilizing around hall at [facility] unit, no AD for  ambulation and transfers. ADLs Comments: Staff assists with bathing and dressing.        OT Problem List: Decreased strength;Decreased coordination;Decreased range of motion;Decreased cognition;Decreased activity tolerance;Decreased safety awareness;Impaired balance (sitting and/or standing);Decreased knowledge of use of DME or AE;Impaired vision/perception;Decreased knowledge of precautions;Impaired UE functional use      OT Treatment/Interventions: Self-care/ADL training;Therapeutic exercise;Therapeutic activities;Neuromuscular education;Cognitive remediation/compensation;Energy conservation;Visual/perceptual remediation/compensation;DME and/or AE instruction;Patient/family education;Balance training    OT Goals(Current goals can be found in the care plan section) Acute Rehab OT Goals OT Goal Formulation: Patient unable to participate in goal setting Time For Goal Achievement: 09/18/22 Potential to Achieve Goals: Fair ADL Goals Pt Will Perform Eating: with min assist;sitting Pt Will Perform Grooming: with min assist;sitting Pt Will Perform Upper Body Bathing: with min assist;sitting Pt Will Perform Upper Body Dressing: with min assist;sitting  OT Frequency: Min 2X/week       AM-PAC OT "6 Clicks" Daily Activity     Outcome Measure Help from another person eating meals?: A Lot Help from another person taking care of personal grooming?: A Lot Help from another person toileting, which includes using toliet, bedpan, or urinal?: Total Help from another person bathing (including washing, rinsing, drying)?: Total Help from another person to put on and taking off regular upper body clothing?: A Lot Help from another person to put on and taking off regular lower body clothing?: Total 6 Click Score: 9   End of Session Equipment Utilized During Treatment: Rolling walker (2 wheels);Gait belt Nurse  Communication: Mobility status  Activity Tolerance:  (Fair overall tolerance) Patient left: in  chair;with call bell/phone within reach;with chair alarm set;with family/visitor present  OT Visit Diagnosis: Muscle weakness (generalized) (M62.81);Feeding difficulties (R63.3)                Time: 2595-6387 OT Time Calculation (min): 44 min Charges:  OT General Charges $OT Visit: 1 Visit OT Evaluation $OT Eval Moderate Complexity: 1 Mod    Ibtisam Benge L Tryson Lumley, OTR/L 09/04/2022, 3:06 PM

## 2022-09-04 NOTE — Progress Notes (Signed)
Palliative-   Consult received and chart reviewed. Patient is under care of [facility]. Palliative was consulted due to son being confused about hospice services and wanting to rescind hospice for patient to go to nursing facility.  [facility] liaison notified and able to discuss these issues with family.  Palliative will sign off for now- please feel free to reconsult if further assistance is needed.   Mariana Kaufman, AGNP-C Palliative Medicine  No charge

## 2022-09-04 NOTE — Care Management Important Message (Signed)
Important Message  Patient Details Hospice Patient. Name: Nancy [name] MRN: [id number] Date of Birth: 12-19-1950   Medicare Important Message Given:  No     Kerin Salen 09/04/2022, 10:40 AM

## 2022-09-04 NOTE — Progress Notes (Signed)
[facility]) Hospital Liaison Note Hospitalized Hospice Patient Visit  Nancy [name] is a current hospice patient followed at [facility] facility, with terminal diagnosis of Severe protein calorie malnutrition and dementia.  Patient had two falls at her facility Friday 9/29.  After hours [facility] nurse called to assess patient and found patient's right leg to be swollen, externally rotated and shortened compared to left.  The decision was made to have further evaluation at the ED.  Hospice was notified prior to sending patient to the hospital.  This is a related hospital admission per Dr. Antonieta Loveless, [facility] MD.    Visited with Patient at bedside, pt remained asleep.  Called to speak with son Nancy [name] to clarify hospice services vs. SNF.  Son acknowledges that patient may not be able to participate in rehab due to decreased mental capacity, and inability to follow cues for physical mobility.  Patient is restricted to touch down weight bearing per orthopedics orders.   Nancy [name] agreed to see how patient does with PT today and hospice will continue to follow care.    The patient is GIP appropriate as she is receiving continuous IV fluid and IVBP methocarbamol for pain management q6h.  Pain is also being managed with PO oxycodone IR 5mg  tablet q6h.    VS 97.8 oral 144/78 HR 82 RR 16   I&O Intake 2573.81 ml  Output 2350 ml  Net 223.81 ml   Abnormal Labs Hgb 9.8 (up from 7.4, s/p blood transfusion 2 units) Diagnostics DG FEMUR 2View: Proximal Right femur fracture with displacement DG Chest: new posterior right fourth through seventh ribs indeterminate fractures IV/PRN meds (GIP Meds that justify why they cannot go home) NS @100  mL/h, IVBP methocarbamol q6h, PO 5mg  oxycodone q6h Problems List Right Hip Intertrochanteric Fracture s/p IM nailing -pain currently managed with tylenol -awaiting PT/OT consult  -At baseline patient ambulates around facility independently    Urinary Retention -foley placed in ED by Urologist    Acute on Chronic Metabolic Encephalopathy -UA NEG  -Dementia at baseline GOC  Treat right hip fracture, manage pain.  Patient son, Nancy [name], is considering moving patient to SNF for rehab vs returning to [facility] care.  IDT: Hospice Staff updated. Report exchanged with hospital staff.   Anticipated discharge pending patient's ability to participate in rehab vs. returning to [facility].   Please contact [facility] for transport if needed, as they are contracted to transport [place] active hospice patients.     Thank you for the opportunity to participate in this patient's care.    Kenna Gilbert BSN, RN  [facility] Liaison  [phone]

## 2022-09-05 DIAGNOSIS — S72001A Fracture of unspecified part of neck of right femur, initial encounter for closed fracture: Secondary | ICD-10-CM | POA: Diagnosis not present

## 2022-09-05 NOTE — Progress Notes (Addendum)
Patient stable Range of motion right hip minimally painful Dressings intact Skilled nursing placement in progress Needs to return to clinic a week from tomorrow at which time we will remove the sutures and check radiographs and likely allow her to be weightbearing as tolerated at that time  Aspirin for DVT prophylaxis 81 mg twice a day Norco for pain if needed but it does not really look like she is having too much pain when I roll the right leg around.

## 2022-09-05 NOTE — TOC Progression Note (Addendum)
Transition of Care [facility]) - Progression Note    Patient Details  Name: Nancy [name] MRN: [id number] Date of Birth: 06/10/1951  Transition of Care [facility] CM/SW [place], [place] Phone Number: 09/05/2022, 11:14 AM  Clinical Narrative:    Pt's son has chosen to temporarily end hospice services for this pt as pt would benefit from SNF placement prior to returning to [facility].  CSW spoke with pt's son and he has accepted bed offer for SNF placement at [facility]. Insurance Josem Kaufmann has been requested for SNF.  Pt will continue to receive palliative care services through [facility].    Expected Discharge Plan: Memory Care Barriers to Discharge: Continued Medical Work up  Expected Discharge Plan and Services Expected Discharge Plan: Memory Care In-house Referral: NA Discharge Planning Services: CM Consult Post Acute Care Choice: Resumption of Svcs/PTA Provider Living arrangements for the past 2 months: [place]                 DME Arranged: Walker rolling DME Agency: [facility] [place] Date DME Agency Contacted: 09/03/22 Time DME Agency Contacted: [id number] Representative spoke with at DME Agency: [place] Agency: [facility] [place] [facility] Interventions    Readmission Risk Interventions    09/02/2022    1:07 PM  Readmission Risk Prevention Plan  Transportation Screening Complete  PCP or Specialist Appt within 5-7 Days Complete  Home Care Screening Complete  Medication Review (RN CM) Complete

## 2022-09-05 NOTE — Progress Notes (Signed)
Occupational Therapy Treatment Patient Details Name: Nancy [name] MRN: [id number] DOB: [birth year] Today's Date: 09/05/2022   History of present illness Patient is 71 y.o. female presented on 09/01/2022 from [facility] SNF after a fall. Pt noted to have a right hip fracture and no s/p ORIF on 09/01/2022. PMH significant for dementia, essential hypertension, hyperlipidemia.   OT comments  Patient was noted to be with slight restlessness, anxiousness, and increased confusion, upon this OT entering her room. She was noted to verbalize about tangential topics. As such, she required frequent redirection to tasks. She was also noted to have decreased recall of precautions, decreased insight into deficits, and decreased safety awareness, as she was noted to attempt to stand prematurely multiple times after being assisted to EOB. Her overall sitting balance was improved today, as she required SBA to min guard assist in this regard. Her ability to perform simple self-feeding EOB was also improved today. She will continue to benefit from OT services to maximize her safety and independence with ADLs.    Recommendations for follow up therapy are one component of a multi-disciplinary discharge planning process, led by the attending physician.  Recommendations may be updated based on patient status, additional functional criteria and insurance authorization.    Follow Up Recommendations  Skilled nursing-short term rehab (<3 hours/day)    Assistance Recommended at Discharge Frequent or constant Supervision/Assistance  Patient can return home with the following  A lot of help with bathing/dressing/bathroom;A lot of help with walking and/or transfers         Precautions / Restrictions Precautions Precautions: Fall Restrictions Weight Bearing Restrictions: Yes RLE Weight Bearing: Touchdown weight bearing Other Position/Activity Restrictions: TDWB for pivot transfers with RW only       Mobility  Bed Mobility Overal bed mobility: Needs Assistance Bed Mobility: Supine to Sit     Supine to sit: Mod assist, HOB elevated Sit to supine: Max assist   General bed mobility comments: required general cues for sequencing/transfer technique, including reaching for bed rail and advancing B LE    Transfers Overall transfer level: Needs assistance Equipment used: Rolling walker (2 wheels) Transfers: Sit to/from Stand, Bed to chair/wheelchair/BSC Sit to Stand: From elevated surface, Min assist           General transfer comment: She took lateral steps along the EOB using a RW, requiring mod assist, cues for safety awareness, and cues for RW advancement and sequencing steps         ADL either performed or assessed with clinical judgement   ADL   Eating/Feeding: Minimal assistance;Sitting;Bed level Eating/Feeding Details (indicate cue type and reason): She required min hand-over-hand assist to drink water from a cup seated EOB. Grooming: Sitting;Bed level;Wash/dry face;Moderate assistance Grooming Details (indicate cue type and reason): She required cues for initiation and sustained attention to task, in order to perform face washing seated EOB.             Lower Body Dressing: Total assistance                                 Cognition Arousal/Alertness: Awake/alert Behavior During Therapy: Anxious (Confused) Overall Cognitive Status: History of cognitive impairments - at baseline                    General Comments: required increased cues for general safety awareness, decreased insight into deficits  Pertinent Vitals/ Pain       Pain Assessment Pain Assessment: No/denies pain   Frequency  Min 2X/week        Progress Toward Goals  OT Goals(current goals can now be found in the care plan section)  Progress towards OT goals: Progressing toward goals  Acute Rehab OT Goals OT Goal Formulation: Patient unable to  participate in goal setting Time For Goal Achievement: 09/18/22 Potential to Achieve Goals: Fair  Plan Discharge plan remains appropriate       AM-PAC OT "6 Clicks" Daily Activity     Outcome Measure   Help from another person eating meals?: A Lot Help from another person taking care of personal grooming?: A Lot Help from another person toileting, which includes using toliet, bedpan, or urinal?: Total Help from another person bathing (including washing, rinsing, drying)?: A Lot Help from another person to put on and taking off regular upper body clothing?: A Lot Help from another person to put on and taking off regular lower body clothing?: Total 6 Click Score: 10    End of Session Equipment Utilized During Treatment: Rolling walker (2 wheels)  OT Visit Diagnosis: Muscle weakness (generalized) (M62.81);Unsteadiness on feet (R26.81)   Activity Tolerance  (Limited by confusion)   Patient Left with call bell/phone within reach;in bed;with bed alarm set   Nurse Communication  (Nurse cleared pt for therapy)        Time: 7782-4235 OT Time Calculation (min): 18 min  Charges: OT General Charges $OT Visit: 1 Visit OT Treatments $Therapeutic Activity: 8-22 mins    Reuben Likes, OTR/L 09/05/2022, 5:03 PM

## 2022-09-05 NOTE — Progress Notes (Signed)
PROGRESS NOTE Nancy [name]  DJM:[id number] DOB: [birth year] DOA: 09/01/2022 PCP: Pcp, No   Brief Narrative/Hospital Course: 46F w/ Hx dementia, HTN, HLD admit with altered mental status and a fall with right hip fracture s/p ORIF IMN 10/1 August [place]).  Patient also had questionable UTI treated with Ceftriaxone and admitted.  Patient is a currently hospice patient with diagnosis of severe protein calorie malnutrition and dementia. Palliative care was consulted family/patient rescinded hospice and planning for rehab.  Initial work-up with CT head, CT C-spine chest x-ray in the ED along with right hip pelvis x-ray showing right femoral fracture.  Status post ORIF.  At this time surgically stable hemoglobin holding stable, PT OT following recommending skilled nursing facility.  PTA she was a hospice patient     Subjective: Alert awake with baseline dementia, pleasant not in distress.     Assessment and Plan: Principal Problem:   Closed right hip fracture (HCC) Active Problems:   Acute metabolic encephalopathy   Hyperlipidemia   Hypertension   Dementia (HCC)   Normocytic anemia   SIRS (systemic inflammatory response syndrome) (HCC)   Acute urinary retention   Elevated LFTs   Protein-calorie malnutrition, severe   Hypokalemia   Acute postoperative anemia due to expected blood loss   Right hip fracture secondary to fall status post ORIF 10/1: Continue to replete vitamin D continue aspirin twice daily for DVT prophylaxis continue Tylenol oxycodone for pain control.  TTWB RLE, PT OT per orthopedics.  PTOT following recommending skilled nursing facility  Goals of care Advanced dementia Severe Protein malnutrition Currently a hospice patient: Continue supportive care delirium precaution Zyprexa AND minimize opiates and benzodiazepines.  PT OT consultED.  family wants to removing from hospice to consider for SNF.Currently full code.  Palliative care/[facility] care hospice following-advised  outpatient palliative care follow-up.   Rib fracture incidentally noted-minimal angulation posterior right fourth through seventh ribs which are age-indeterminate likely subacute or older with no pneumothorax.Supportive care, pain control with Tylenol, incentive spirometry  Postoperative acute blood loss anemia Anemia of chronic disease:Keep hemoglobin above 7 g.  Stable and monitor as below  Recent Labs  Lab 09/02/22 0717 09/03/22 0555 09/03/22 0956 09/03/22 1757 09/04/22 0556  HGB 8.3* SPECIMEN CONTAMINATED, UNABLE TO PERFORM TEST(S). 7.4* 9.7* 9.8*  HCT 26.0* SPECIMEN CONTAMINATED, UNABLE TO PERFORM TEST(S). 23.1* 30.6* 30.2*    Vitamin D deficiency at 22-started weekly replacement high-dose Hypokalemia repleted UTI/urine retention: More than 400 cc unsuccessful Foley attempts in the ED, urology consulted Dr. Alvester Morin placed Foley 10/1 completed 3 days of antibiotics, continue Foley catheter  at least for 5 days and continue until she is able to mobilize more prior to voiding trial  Hypertension/HLD: BP stable not needing meds.  Not on a statin  DVT prophylaxis: SCDs Start: 09/01/22 2330 Place TED hose Start: 09/01/22 2330 SCDs Start: 09/01/22 1845 Code Status:   Code Status: Full Code Family Communication: plan of care discussed with patient at bedside. Patient status is: Inpatient because of hip fracture Level of care: Telemetry   Dispo: The patient is from: Hospice            Anticipated disposition: hospice vs SNF.At this time medically stable TOC/Palliative care/[facility] TEAM following to decide disposition.    Objective: Vitals last 24 hrs: Vitals:   09/04/22 1430 09/04/22 1949 09/05/22 0506 09/05/22 0803  BP: 129/67 132/73 138/75 133/75  Pulse: 95 (!) 108 (!) 133 (!) 109  Resp:  14 20 20   Temp: 98.8 F (37.1 C) 98  F (36.7 C) 98.1 F (36.7 C) 98.2 F (36.8 C)  TempSrc: Oral Oral Oral Oral  SpO2: 100% 100% 99% 100%  Weight:      Height:       Weight change:    Physical Examination: General exam: AAox1, older than stated age, weak appearing. HEENT:Oral mucosa moist, Ear/Nose WNL grossly, dentition normal. Respiratory system: bilaterally diminished, no use of accessory muscle Cardiovascular system: S1 & S2 +, No JVD,. Gastrointestinal system: Abdomen soft,NT,ND,BS+ Nervous System:Alert, awake, moving extremities and grossly nonfocal Extremities: LE ankle edema neg, right hip bruise present with Aquacel dressings intact, distal peripheral pulses palpable.  Skin: No rashes,no icterus. MSK: Normal muscle bulk,tone, power   Medications reviewed:  Scheduled Meds:  aspirin  81 mg Oral BID   Chlorhexidine Gluconate Cloth  6 each Topical Daily   citalopram  10 mg Oral Daily   docusate sodium  100 mg Oral BID   feeding supplement  237 mL Oral BID BM   multivitamin with minerals  1 tablet Oral Daily   OLANZapine  10 mg Oral QHS   Vitamin D (Ergocalciferol)  50,000 Units Oral Q7 days   Continuous Infusions:  sodium chloride 100 mL/hr at 09/05/22 0840   methocarbamol (ROBAXIN) IV 500 mg (09/03/22 0118)      Diet Order             Diet regular Room service appropriate? Yes; Fluid consistency: Thin  Diet effective now                    Nutrition Problem: Severe Malnutrition Etiology: chronic illness (dementia) Signs/Symptoms: severe fat depletion, severe muscle depletion Interventions: Ensure Enlive (each supplement provides 350kcal and 20 grams of protein), MVI   Intake/Output Summary (Last 24 hours) at 09/05/2022 1101 Last data filed at 09/05/2022 9326 Gross per 24 hour  Intake 2084.84 ml  Output 4300 ml  Net -2215.16 ml   Net IO Since Admission: 1,516.6 mL [09/05/22 1101]  Wt Readings from Last 3 Encounters:  09/01/22 50.2 kg  01/13/22 55 kg  12/06/21 55.2 kg     Unresulted Labs (From admission, onward)     Start     Ordered   09/01/22 1337  SARS Coronavirus 2 by RT PCR (hospital order, performed in [facility]  lab) *cepheid single result test* Anterior Nasal Swab  (Tier 2 - Symptomatic/Asymptomatic)  Once,   URGENT        09/01/22 1336          Data Reviewed: I have personally reviewed following labs and imaging studies CBC: Recent Labs  Lab 09/01/22 0943 09/02/22 0717 09/03/22 0555 09/03/22 0956 09/03/22 1757 09/04/22 0556  WBC 13.4* 12.4* SPECIMEN CONTAMINATED, UNABLE TO PERFORM TEST(S). 9.5  --  8.9  NEUTROABS 11.2*  --   --   --   --   --   HGB 9.1* 8.3* SPECIMEN CONTAMINATED, UNABLE TO PERFORM TEST(S). 7.4* 9.7* 9.8*  HCT 27.9* 26.0* SPECIMEN CONTAMINATED, UNABLE TO PERFORM TEST(S). 23.1* 30.6* 30.2*  MCV 90.6 92.9 SPECIMEN CONTAMINATED, UNABLE TO PERFORM TEST(S). 92.0  --  92.4  PLT 200 178 SPECIMEN CONTAMINATED, UNABLE TO PERFORM TEST(S). 167  --  712   Basic Metabolic Panel: Recent Labs  Lab 09/01/22 0943 09/02/22 0717 09/03/22 0956 09/04/22 0556  NA 141 141 144 137  K 3.5 4.5 3.4* 3.9  CL 111 112* 117* 107  CO2 27 24 24 23   GLUCOSE 114* 137* 122* 95  BUN 17 17  16 11  CREATININE 0.79 0.77 0.65 0.61  CALCIUM 8.3* 7.9* 7.6* 7.6*  MG  --   --   --  2.0   GFR: Estimated Creatinine Clearance: 50.4 mL/min (by C-G formula based on SCr of 0.61 mg/dL). Liver Function Tests: Recent Labs  Lab 09/01/22 0943  AST 112*  ALT 37  ALKPHOS 64  BILITOT 1.1  PROT 6.3*  ALBUMIN 3.3*   No results for input(s): "LIPASE", "AMYLASE" in the last 168 hours. No results for input(s): "AMMONIA" in the last 168 hours. Coagulation Profile: Recent Labs  Lab 09/01/22 1450  INR 1.2   BNP (last 3 results) No results for input(s): "PROBNP" in the last 8760 hours. HbA1C: No results for input(s): "HGBA1C" in the last 72 hours. CBG: Recent Labs  Lab 09/01/22 0942  GLUCAP 112*   Lipid Profile: No results for input(s): "CHOL", "HDL", "LDLCALC", "TRIG", "CHOLHDL", "LDLDIRECT" in the last 72 hours. Thyroid Function Tests: No results for input(s): "TSH", "T4TOTAL", "FREET4", "T3FREE",  "THYROIDAB" in the last 72 hours. Sepsis Labs: Recent Labs  Lab 09/01/22 1620  LATICACIDVEN 0.9    Recent Results (from the past 240 hour(s))  Urine Culture     Status: Abnormal   Collection Time: 09/01/22  4:36 PM   Specimen: Urine, Clean Catch  Result Value Ref Range Status   Specimen Description   Final    URINE, CLEAN CATCH Performed at [facility], [address] [place], [place] [address]    Special Requests   Final    NONE Performed at [facility], [address] [place], [place] [address]    Culture (A)  Final    <10,000 COLONIES/mL INSIGNIFICANT GROWTH Performed at [facility], [address], [place], [place] [address]    Report Status 09/02/2022 FINAL  Final    Antimicrobials: Anti-infectives (From admission, onward)    Start     Dose/Rate Route Frequency Ordered Stop   09/02/22 0030  ceFAZolin (ANCEF) IVPB 2g/100 mL premix        2 g 200 mL/hr over 30 Minutes Intravenous Every 8 hours 09/01/22 2329 09/02/22 1048   09/01/22 1545  cefTRIAXone (ROCEPHIN) 1 g in sodium chloride 0.9 % 100 mL IVPB        1 g 200 mL/hr over 30 Minutes Intravenous Every 24 hours 09/01/22 1538 09/03/22 1650      Culture/Microbiology    Component Value Date/Time   SDES  09/01/2022 1636    URINE, CLEAN CATCH Performed at [facility], [address] [place], [place] [address]    SPECREQUEST  09/01/2022 1636    NONE Performed at [facility], [address] [place], [place] [address]    CULT (A) 09/01/2022 1636    <10,000 COLONIES/mL INSIGNIFICANT GROWTH Performed at [facility], [address] [place], [place] [address]    REPTSTATUS 09/02/2022 FINAL 09/01/2022 1636    Other culture-see note  Radiology Studies: No results found.   LOS: 4 days   Lanae Boast, MD [facility]  09/05/2022, 11:01 AM

## 2022-09-05 NOTE — Progress Notes (Signed)
Manufacturing engineer [facility]) Hospital Liaison Note  Pt son, Rodman Key, wishes to pursue SNF/ Rehab.  Rodman Key revoked hospice services and is agreeable to outpatient palliative care follow up.    Please reach out with any questions or concerns.  Kenna Gilbert BSN, RN  [facility] Liaison [phone]

## 2022-09-06 ENCOUNTER — Encounter (HOSPITAL_COMMUNITY): Payer: Self-pay | Admitting: Internal Medicine

## 2022-09-06 DIAGNOSIS — S72001A Fracture of unspecified part of neck of right femur, initial encounter for closed fracture: Secondary | ICD-10-CM | POA: Diagnosis not present

## 2022-09-06 MED ORDER — ASPIRIN 81 MG PO CHEW: 81.0000 mg | CHEWABLE_TABLET | Freq: Two times a day (BID) | ORAL | 0 refills | Status: AC

## 2022-09-06 MED ORDER — VITAMIN D (ERGOCALCIFEROL) 1.25 MG (50000 UNIT) PO CAPS: 50000.0000 [IU] | ORAL_CAPSULE | ORAL | 0 refills | Status: AC

## 2022-09-06 MED ORDER — DOCUSATE SODIUM 100 MG PO CAPS: 100.0000 mg | ORAL_CAPSULE | Freq: Two times a day (BID) | ORAL | 0 refills | Status: DC

## 2022-09-06 MED ORDER — ENSURE ENLIVE PO LIQD: 237.0000 mL | Freq: Two times a day (BID) | ORAL | 12 refills | Status: DC

## 2022-09-06 NOTE — TOC Progression Note (Addendum)
Transition of Care [facility]) - Progression Note    Patient Details  Name: Nancy [name] MRN: [id number] Date of Birth: 1951-09-20  Transition of Care [facility]) CM/SW [place], LCSW Phone Number: 09/06/2022, 10:23 AM  Clinical Narrative:    CSW received a call from [place] about pt's insurance for SNF. Per Bernadene Bell since hospice services were rescinded yesterday 09/05/22 pt will not be eligible to go to SNF under her [facility] insurance until the 1st of next month.  CSW left voicemail for pt's son and currently awaiting return call.   Update 12:40pm- CSW spoke with pt's son and updated him on insurance status. Pt's son has decided to private pay for SNF placement. Pt is able to transfer to [facility] today once finance/payment has been completed.   Expected Discharge Plan: Memory Care Barriers to Discharge: Continued Medical Work up  Expected Discharge Plan and Services Expected Discharge Plan: Memory Care In-house Referral: NA Discharge Planning Services: CM Consult Post Acute Care Choice: Resumption of Svcs/PTA Provider Living arrangements for the past 2 months: [place]                 DME Arranged: Walker rolling DME Agency: [facility] [place] Date DME Agency Contacted: 09/03/22 Time DME Agency Contacted: [phone] Representative spoke with at DME Agency: [place] Agency: [facility] [place] [facility] [place] ([place]) Interventions    Readmission Risk Interventions    09/02/2022    1:07 PM  Readmission Risk Prevention Plan  Transportation Screening Complete  PCP or Specialist Appt within 5-7 Days Complete  Home Care Screening Complete  Medication Review (RN CM) Complete

## 2022-09-06 NOTE — Plan of Care (Signed)
Patient is in bed pleasantly confused, she yells out when someone passes the room. Patient has a foley and Mitts are in place to prevent line pulling.

## 2022-09-06 NOTE — Discharge Summary (Addendum)
Physician Discharge Summary  Nancy [name] POE:[id number] DOB: Dec 30, 1950 DOA: 09/01/2022  PCP: Pcp, No  Admit date: 09/01/2022 Discharge date: 09/06/2022 Recommendations for Outpatient Follow-up:  Follow up with PCP and otheropedics  in 1 weeks-call for appointment  she was retaining they need to try voiding trial in 5 to 7 days and follow-up with urology  Discharge Dispo: SNF with hospice Discharge Condition: Stable Code Status:   Code Status: Full Code Diet recommendation:  Diet Order             Diet regular Room service appropriate? Yes; Fluid consistency: Thin  Diet effective now                    Brief/Interim Summary: 13F w/ Hx dementia, HTN, HLD admit with altered mental status and a fall with right hip fracture s/p ORIF IMN 10/1 Nancy [name]).  Patient also had questionable UTI treated with Ceftriaxone and admitted.  Patient is a currently hospice patient with diagnosis of severe protein calorie malnutrition and dementia. Palliative care was consulted family/patient rescinded hospice and planning for rehab.  Initial work-up with CT head, CT C-spine chest x-ray in the ED along with right hip pelvis x-ray showing right femoral fracture.  Status post ORIF.  At this time surgically stable hemoglobin holding stable, PT OT following recommending skilled nursing facility.  PTA she was a hospice patient. Hospice has been rescinded, as per her insurance or benefit will kick in on first of next month when she will be offered for skilled nursing facility. Discharge Diagnoses:  Principal Problem:   Closed right hip fracture (HCC) Active Problems:   Acute metabolic encephalopathy   Hyperlipidemia   Hypertension   Dementia (HCC)   Normocytic anemia   SIRS (systemic inflammatory response syndrome) (HCC)   Acute urinary retention   Elevated LFTs   Protein-calorie malnutrition, severe   Hypokalemia   Acute postoperative anemia due to expected blood loss  Right hip fracture secondary to  fall status post ORIF 10/1: Continue to replete vitamin D continue aspirin twice daily for DVT prophylaxis continue Tylenol oxycodone for pain control.  TTWB RLE, PT OT per orthopedics.  PTOT following recommending skilled nursing facility-Hospice has been rescinded, as per her insurance or benefit will kick in on first of next month when she will be offered for skilled nursing facility. Hospice has been reinstated and patient going back to the facility   Goals of care Advanced dementia Severe Protein malnutrition Currently a hospice patient-now rescinded Continue supportive care delirium precaution Zyprexa.minimize opiates and benzodiazepines.  PT OT consultED.  family wants to removing from hospice to consider for SNF.Currently full code.  Palliative care/[facility] hosp[facility] following-advised outpatient palliative care follow-up.    Rib fracture incidentally noted-minimal angulation posterior right fourth through seventh ribs which are age-indeterminate likely subacute or older with no pneumothorax.Supportive care, pain control with Tylenol, incentive spirometry   Postoperative acute blood loss anemia Anemia of chronic disease:Keep hemoglobin above 7 g.  Stable and monitor as below intermittently.   Vitamin D deficiency at 22-started weekly replacement high-dose Hypokalemia repleted UTI/urine retention: More than 400 cc unsuccessful Foley attempts in the ED, urology consulted Dr. Alvester Morin placed Foley 10/1 completed 3 days of antibiotics, continue Foley catheter  at least for 5 days and continue until she is able to mobilize more prior to voiding trial   Hypertension/HLD: BP stable not needing meds.  Not on a statin  Consults: Pmr, ortho Subjective: Alert awake confused at baseline  Discharge  Exam: Vitals:   09/06/22 0703 09/06/22 1318  BP: 127/69 120/60  Pulse: (!) 120 (!) 127  Resp: 16 17  Temp: 97.7 F (36.5 C) 99.5 F (37.5 C)  SpO2: 100% 98%   General: Pt is alert, awake,  not in acute distress Cardiovascular: RRR, S1/S2 +, no rubs, no gallops Respiratory: CTA bilaterally, no wheezing, no rhonchi Abdominal: Soft, NT, ND, bowel sounds + Extremities: no edema, no cyanosis  Discharge Instructions  Discharge Instructions     Discharge instructions   Complete by: As directed    she was retaining so need to try voiding trial in 5 to 7 days and follow-up with urology  Please call call MD or return to ER for similar or worsening recurring problem that brought you to hospital or if any fever,nausea/vomiting,abdominal pain, uncontrolled pain, chest pain,  shortness of breath or any other alarming symptoms.  Please follow-up your doctor as instructed in a week time and call the office for appointment.  Please avoid alcohol, smoking, or any other illicit substance and maintain healthy habits including taking your regular medications as prescribed.  You were cared for by a hospitalist during your hospital stay. If you have any questions about your discharge medications or the care you received while you were in the hospital after you are discharged, you can call the unit and ask to speak with the hospitalist on call if the hospitalist that took care of you is not available.  Once you are discharged, your primary care physician will handle any further medical issues. Please note that NO REFILLS for any discharge medications will be authorized once you are discharged, as it is imperative that you return to your primary care physician (or establish a relationship with a primary care physician if you do not have one) for your aftercare needs so that they can reassess your need for medications and monitor your lab values   Discharge wound care:   Complete by: As directed    Reinforce dressing until removed by orthopedic surgery in postop follow-up in the office with Dr. Deforest Hoyles activity slowly   Complete by: As directed       Allergies as of 09/06/2022        Reactions   Cefdinir Diarrhea, Other (See Comments)   Patient states this medication makes her "feel weird," as well as having diarrhea        Medication List     TAKE these medications    acetaminophen 500 MG tablet Commonly known as: TYLENOL Take 500 mg by mouth every 6 (six) hours as needed for mild pain, fever or headache.   aspirin 81 MG chewable tablet Chew 1 tablet (81 mg total) by mouth 2 (two) times daily for 25 days.   citalopram 10 MG tablet Commonly known as: CELEXA Take 10 mg by mouth daily.   docusate sodium 100 MG capsule Commonly known as: COLACE Take 1 capsule (100 mg total) by mouth 2 (two) times daily.   feeding supplement Liqd Take 237 mLs by mouth 2 (two) times daily between meals.   loperamide 2 MG capsule Commonly known as: IMODIUM Take 2 mg by mouth as needed (with each loose stool- cannot exceed 8 doses/24 hours).   magnesium hydroxide 400 MG/5ML suspension Commonly known as: MILK OF MAGNESIA Take 30 mLs by mouth at bedtime as needed for mild constipation.   Mi-Acid 200-200-20 MG/5ML suspension Generic drug: alum & mag hydroxide-simeth Take 30 mLs by mouth every 6 (six)  hours as needed for indigestion or heartburn.   neomycin-bacitracin-polymyxin [phone] Oint Apply 1 Application topically as needed (to minor abrasions after cleaning with normal saline- cover with a Band-Aid or Gauze & tape).   OLANZapine 10 MG tablet Commonly known as: ZYPREXA Take 10 mg by mouth daily.   PRESCRIPTION MEDICATION Take 1 each by mouth in the morning, at noon, and at bedtime. Mighty Shakes   Robafen 100 MG/5ML liquid Generic drug: guaiFENesin Take 10 mLs by mouth every 6 (six) hours as needed for cough.   Vitamin D (Ergocalciferol) 1.25 MG (50000 UNIT) Caps capsule Commonly known as: DRISDOL Take 1 capsule (50,000 Units total) by mouth every 7 (seven) days for 4 doses. Start taking on: September 10, 2022               Discharge Care  Instructions  (From admission, onward)           Start     Ordered   09/06/22 0000  Discharge wound care:       Comments: Reinforce dressing until removed by orthopedic surgery in postop follow-up in the office with Dr. Marlou Sa   09/06/22 1058            Contact information for follow-up providers     Marlou Sa Tonna Corner, MD Follow up in 1 week(s).   Specialty: Orthopedic Surgery Contact information: [place] [address] [phone]         [facility] Follow up in 1 week(s).   Why: she was retaining, need to try voiding trial in 5 to 7 days and follow-up with urology Contact information: [place] [phone]             Contact information for after-discharge care     [facility] .   Service: Skilled Nursing Contact information: [address] [address] [phone]                    Allergies  Allergen Reactions   Cefdinir Diarrhea and Other (See Comments)    Patient states this medication makes her "feel weird," as well as having diarrhea    The results of significant diagnostics from this hospitalization (including imaging, microbiology, ancillary and laboratory) are listed below for reference.    Microbiology: Recent Results (from the past 240 hour(s))  Urine Culture     Status: Abnormal   Collection Time: 09/01/22  4:36 PM   Specimen: Urine, Clean Catch  Result Value Ref Range Status   Specimen Description   Final    URINE, CLEAN CATCH Performed at [facility], [place] [address], [place], [place] [address]    Special Requests   Final    NONE Performed at [facility], [place] [address], [place], [place] [address]    Culture (A)  Final    <10,000 COLONIES/mL INSIGNIFICANT GROWTH Performed at [place] [address] [place], [place]  [address]    Report Status 09/02/2022 FINAL  Final    Procedures/Studies: DG FEMUR, MIN 2 VIEWS RIGHT  Result Date: 09/02/2022 CLINICAL DATA:  Status post ORIF right femoral fracture EXAM: RIGHT FEMUR 2 VIEWS COMPARISON:  09/01/2022 FINDINGS: Medullary rod is noted within the right femur with proximal fixation screw traversing the femoral neck. Fracture fragments are in near anatomic alignment. IMPRESSION: Status post ORIF of proximal right femoral fracture.  Electronically Signed   By: Alcide Clever M.D.   On: 09/02/2022 01:08   DG FEMUR, MIN 2 VIEWS RIGHT  Result Date: 09/01/2022 CLINICAL DATA:  Intraoperative right IM nail EXAM: RIGHT FEMUR 2 VIEWS; DG C-ARM 1-60 MIN-NO REPORT COMPARISON:  Right femur/hip radiographs dated 09/01/2022 Fluoroscopy time: 42 seconds 7.02 mGy FINDINGS: Intraoperative fluoroscopic images during IM nail with dynamic hip screw fixation of an intertrochanteric right hip fracture. Displaced lesser trochanteric fragment. Fracture fragments are otherwise in near anatomic alignment. IMPRESSION: Intraoperative fluoroscopic images during ORIF of a right hip fracture, as above. Electronically Signed   By: Charline Bills M.D.   On: 09/01/2022 22:01   DG C-Arm 1-60 Min-No Report  Result Date: 09/01/2022 Fluoroscopy was utilized by the requesting physician.  No radiographic interpretation.   DG Elbow Complete Left  Result Date: 09/01/2022 CLINICAL DATA:  Fall earlier today. EXAM: LEFT ELBOW - COMPLETE 3+ VIEW COMPARISON:  None Available. FINDINGS: There is no evidence of fracture, dislocation, or joint effusion. There is no evidence of focal bone abnormality. Mild osteoarthritis of the ulnohumeral joint. Soft tissues are unremarkable. IMPRESSION: No acute osseous abnormality. Electronically Signed   By: Sherron Ales M.D.   On: 09/01/2022 11:39   DG Hip Unilat W or Wo Pelvis 2-3 Views Left  Result Date: 09/01/2022 CLINICAL DATA:  Fall earlier today. EXAM: DG HIP (WITH OR WITHOUT  PELVIS) 2V LEFT COMPARISON:  Pelvis radiograph from earlier today. FINDINGS: There is no evidence of hip fracture or dislocation. There is no evidence of arthropathy or other focal bone abnormality. Osteopenia. IMPRESSION: Negative left hip. Electronically Signed   By: Tiburcio Pea M.D.   On: 09/01/2022 11:29   CT Head Wo Contrast  Result Date: 09/01/2022 CLINICAL DATA:  Head injury.  Fall. EXAM: CT HEAD WITHOUT CONTRAST CT CERVICAL SPINE WITHOUT CONTRAST TECHNIQUE: Multidetector CT imaging of the head and cervical spine was performed following the standard protocol without intravenous contrast. Multiplanar CT image reconstructions of the cervical spine were also generated. RADIATION DOSE REDUCTION: This exam was performed according to the departmental dose-optimization program which includes automated exposure control, adjustment of the mA and/or kV according to patient size and/or use of iterative reconstruction technique. COMPARISON:  01/13/2022 FINDINGS: CT HEAD FINDINGS Brain: No evidence of acute infarction, hemorrhage, hydrocephalus, extra-axial collection or mass lesion/mass effect. Mild cerebral volume loss and chronic small vessel ischemia. Vascular: No hyperdense vessel or unexpected calcification. Skull: Normal. Negative for fracture or focal lesion. Sinuses/Orbits: No evidence of injury. CT CERVICAL SPINE FINDINGS Alignment: Normal. Skull base and vertebrae: No acute fracture. No primary bone lesion or focal pathologic process. Soft tissues and spinal canal: No prevertebral fluid or swelling. No visible canal hematoma. Disc levels:  Ordinary degenerative changes. Upper chest: No evidence of injury IMPRESSION: No evidence of intracranial or cervical spine injury. Electronically Signed   By: Tiburcio Pea M.D.   On: 09/01/2022 10:48   CT Cervical Spine Wo Contrast  Result Date: 09/01/2022 CLINICAL DATA:  Head injury.  Fall. EXAM: CT HEAD WITHOUT CONTRAST CT CERVICAL SPINE WITHOUT CONTRAST  TECHNIQUE: Multidetector CT imaging of the head and cervical spine was performed following the standard protocol without intravenous contrast. Multiplanar CT image reconstructions of the cervical spine were also generated. RADIATION DOSE REDUCTION: This exam was performed according to the departmental dose-optimization program which includes automated exposure control, adjustment of the mA and/or kV according to patient size and/or use of iterative reconstruction technique. COMPARISON:  01/13/2022 FINDINGS: CT HEAD FINDINGS  Brain: No evidence of acute infarction, hemorrhage, hydrocephalus, extra-axial collection or mass lesion/mass effect. Mild cerebral volume loss and chronic small vessel ischemia. Vascular: No hyperdense vessel or unexpected calcification. Skull: Normal. Negative for fracture or focal lesion. Sinuses/Orbits: No evidence of injury. CT CERVICAL SPINE FINDINGS Alignment: Normal. Skull base and vertebrae: No acute fracture. No primary bone lesion or focal pathologic process. Soft tissues and spinal canal: No prevertebral fluid or swelling. No visible canal hematoma. Disc levels:  Ordinary degenerative changes. Upper chest: No evidence of injury IMPRESSION: No evidence of intracranial or cervical spine injury. Electronically Signed   By: Tiburcio Pea M.D.   On: 09/01/2022 10:48   DG Chest 1 View  Result Date: 09/01/2022 CLINICAL DATA:  Fall.  Right hip fracture. EXAM: CHEST  1 VIEW COMPARISON:  AP chest 12/04/2021, chest two views 11/29/2021; CTA chest 12/04/2021 FINDINGS: There is improved aeration of the left lower lung, now with mild linear likely atelectasis and scarring where previously there was higher density heterogeneous airspace opacification on 11/29/2021 and 12/04/2021 radiographs and 12/04/2021 CT. The right lung is clear. No pleural effusion or pneumothorax. Cardiac silhouette and mediastinal contours within normal limits. Mild-to-moderate calcification within the aortic arch.  There is new minimal angulation of the posterior right fourth through seventh ribs, age indeterminate fractures. There may be mild healing callus formation in this region and these are favored to be acute to subacute. IMPRESSION: 1. Improved aeration of the left lower lung, now with likely mild linear atelectasis and scarring. There appears to be resolution of the prior pneumonia seen on 12/04/2021. 2. New minimal angulation of the posterior right fourth through seventh ribs, age indeterminate fractures. There may be mild healing callus formation at these fractures, and these are favored to be subacute or older. Recommend clinical correlation. No pneumothorax. Electronically Signed   By: Neita Garnet M.D.   On: 09/01/2022 10:44   DG Hip Unilat W or Wo Pelvis 2-3 Views Right  Result Date: 09/01/2022 CLINICAL DATA:  Fall.  Right hip pain. EXAM: DG HIP (WITH OR WITHOUT PELVIS) 2-3V RIGHT; RIGHT FEMUR 2 VIEWS COMPARISON:  AP pelvis 08/30/2020 FINDINGS: There is an acute fracture of the right greater trochanter extending through the intertrochanteric region and through the lesser trochanter. Mild superior and medial displacement of lesser trochanter. Moderate varus angulation. Moderate high-grade fracture impaction. The right femoral head appears normally located with respect to the right acetabulum. Minimal superior left femoroacetabular joint space narrowing. Minimal superolateral left acetabular degenerative osteophytosis. Joint spaces are preserved. No acute fracture is seen within the more distal aspect of the right femur. Moderate right knee osteoarthritis. IMPRESSION: Acute intertrochanteric proximal right femoral fracture with mild displacement, moderate varus angulation, and moderate to high-grade fracture impaction. Electronically Signed   By: Neita Garnet M.D.   On: 09/01/2022 10:39   DG Femur Min 2 Views Right  Result Date: 09/01/2022 CLINICAL DATA:  Fall.  Right hip pain. EXAM: DG HIP (WITH OR  WITHOUT PELVIS) 2-3V RIGHT; RIGHT FEMUR 2 VIEWS COMPARISON:  AP pelvis 08/30/2020 FINDINGS: There is an acute fracture of the right greater trochanter extending through the intertrochanteric region and through the lesser trochanter. Mild superior and medial displacement of lesser trochanter. Moderate varus angulation. Moderate high-grade fracture impaction. The right femoral head appears normally located with respect to the right acetabulum. Minimal superior left femoroacetabular joint space narrowing. Minimal superolateral left acetabular degenerative osteophytosis. Joint spaces are preserved. No acute fracture is seen within the more distal aspect of  the right femur. Moderate right knee osteoarthritis. IMPRESSION: Acute intertrochanteric proximal right femoral fracture with mild displacement, moderate varus angulation, and moderate to high-grade fracture impaction. Electronically Signed   By: Neita Garnet M.D.   On: 09/01/2022 10:39   DG Shoulder Right  Result Date: 09/01/2022 CLINICAL DATA:  Fall. EXAM: RIGHT SHOULDER - 2+ VIEW COMPARISON:  Right shoulder and humerus radiographs 08/30/2020 FINDINGS: Interval healing of the prior remote nondisplaced greater tuberosity and mildly displaced surgical neck fractures. There is diffuse decreased bone mineralization. There are some lucencies in the region of the prior fracture lines, however the margins appear corticated and these are favored to represent the patient's now healed fracture. No definite acute fracture is seen. Mild glenohumeral joint space narrowing. Minimally angulated posterior 4th through seventh rib fractures with mild healing callus formation, subacute to chronic. IMPRESSION: 1. Interval healing of the remote nondisplaced greater tuberosity and mildly displaced surgical neck fractures. 2. No definite acute fracture is seen. 3. Minimally angulated posterior 4th through seventh rib fractures with mild healing callus formation, subacute to chronic.  Electronically Signed   By: Neita Garnet M.D.   On: 09/01/2022 10:34    Labs: BNP (last 3 results) Recent Labs    12/04/21 1325  BNP 210.9*   Basic Metabolic Panel: Recent Labs  Lab 09/01/22 0943 09/02/22 0717 09/03/22 0956 09/04/22 0556  NA 141 141 144 137  K 3.5 4.5 3.4* 3.9  CL 111 112* 117* 107  CO2 27 24 24 23   GLUCOSE 114* 137* 122* 95  BUN 17 17 16 11   CREATININE 0.79 0.77 0.65 0.61  CALCIUM 8.3* 7.9* 7.6* 7.6*  MG  --   --   --  2.0   Liver Function Tests: Recent Labs  Lab 09/01/22 0943  AST 112*  ALT 37  ALKPHOS 64  BILITOT 1.1  PROT 6.3*  ALBUMIN 3.3*   No results for input(s): "LIPASE", "AMYLASE" in the last 168 hours. No results for input(s): "AMMONIA" in the last 168 hours. CBC: Recent Labs  Lab 09/01/22 0943 09/02/22 0717 09/03/22 0555 09/03/22 0956 09/03/22 1757 09/04/22 0556  WBC 13.4* 12.4* SPECIMEN CONTAMINATED, UNABLE TO PERFORM TEST(S). 9.5  --  8.9  NEUTROABS 11.2*  --   --   --   --   --   HGB 9.1* 8.3* SPECIMEN CONTAMINATED, UNABLE TO PERFORM TEST(S). 7.4* 9.7* 9.8*  HCT 27.9* 26.0* SPECIMEN CONTAMINATED, UNABLE TO PERFORM TEST(S). 23.1* 30.6* 30.2*  MCV 90.6 92.9 SPECIMEN CONTAMINATED, UNABLE TO PERFORM TEST(S). 92.0  --  92.4  PLT 200 178 SPECIMEN CONTAMINATED, UNABLE TO PERFORM TEST(S). 167  --  170   Cardiac Enzymes: No results for input(s): "CKTOTAL", "CKMB", "CKMBINDEX", "TROPONINI" in the last 168 hours. BNP: Invalid input(s): "POCBNP" CBG: Recent Labs  Lab 09/01/22 0942  GLUCAP 112*   D-Dimer No results for input(s): "DDIMER" in the last 72 hours. Hgb A1c No results for input(s): "HGBA1C" in the last 72 hours. Lipid Profile No results for input(s): "CHOL", "HDL", "LDLCALC", "TRIG", "CHOLHDL", "LDLDIRECT" in the last 72 hours. Thyroid function studies No results for input(s): "TSH", "T4TOTAL", "T3FREE", "THYROIDAB" in the last 72 hours.  Invalid input(s): "FREET3" Anemia work up No results for input(s):  "VITAMINB12", "FOLATE", "FERRITIN", "TIBC", "IRON", "RETICCTPCT" in the last 72 hours. Urinalysis    Component Value Date/Time   COLORURINE YELLOW 09/01/2022 1636   APPEARANCEUR CLEAR 09/01/2022 1636   LABSPEC 1.025 09/01/2022 1636   PHURINE 5.0 09/01/2022 1636   GLUCOSEU NEGATIVE 09/01/2022  1636   HGBUR MODERATE (A) 09/01/2022 1636   BILIRUBINUR NEGATIVE 09/01/2022 1636   KETONESUR NEGATIVE 09/01/2022 1636   PROTEINUR 30 (A) 09/01/2022 1636   NITRITE NEGATIVE 09/01/2022 1636   LEUKOCYTESUR NEGATIVE 09/01/2022 1636   Sepsis Labs Recent Labs  Lab 09/02/22 0717 09/03/22 0555 09/03/22 0956 09/04/22 0556  WBC 12.4* SPECIMEN CONTAMINATED, UNABLE TO PERFORM TEST(S). 9.5 8.9   Microbiology Recent Results (from the past 240 hour(s))  Urine Culture     Status: Abnormal   Collection Time: 09/01/22  4:36 PM   Specimen: Urine, Clean Catch  Result Value Ref Range Status   Specimen Description   Final    URINE, CLEAN CATCH Performed at [facility], [address] [place], [place] [address]    Special Requests   Final    NONE Performed at [facility], [address] [place], [place] [address]    Culture (A)  Final    <10,000 COLONIES/mL INSIGNIFICANT GROWTH Performed at [facility], [address] [place], [place] [address]    Report Status 09/02/2022 FINAL  Final     Time coordinating discharge: 35 minutes  SIGNED: Lanae Boastamesh Mat Stuard, MD  [facility] 09/06/2022, 2:29 PM  If 7PM-7AM, please contact night-coverage [email or web address]

## 2022-09-06 NOTE — TOC Transition Note (Signed)
Transition of Care [facility]) - CM/SW Discharge Note   Patient Details  Name: Nancy [name] MRN: [id number] Date of Birth: Sep 04, 1951  Transition of Care [facility]) CM/SW Contact:  Vassie Moselle, LCSW Phone Number: 09/06/2022, 2:19 PM   Clinical Narrative:    Pt is to transfer to [facility]. Pt will be going to room 127. RN to call report to [phone]. Pt will resume hospice services with [facility]. Pt will be transported via [facility].    Final next level of care: Skilled Nursing Facility Barriers to Discharge: Barriers Resolved   Patient Goals and CMS Choice Patient states their goals for this hospitalization and ongoing recovery are:: Unable to obtain CMS Medicare.gov Compare Post Acute Care list provided to:: Patient Represenative (must comment) (Son) Choice offered to / list presented to : [facility] POA / Guardian, Adult Children  Discharge Placement PASRR number recieved: 09/03/22            Patient chooses bed at: [facility] Patient to be transferred to facility by: [facility] Name of family member notified: Sandi Mariscal Patient and family notified of of transfer: 09/06/22  Discharge Plan and Services In-house Referral: NA Discharge Planning Services: CM Consult Post Acute Care Choice: Resumption of Svcs/PTA Provider          DME Arranged: N/A DME Agency: NA Date DME Agency Contacted: 09/03/22 Time DME Agency Contacted: 2025 Representative spoke with at DME Agency: [place] Agency: [facility] [place] [facility] Interventions     Readmission Risk Interventions    09/02/2022    1:07 PM  Readmission Risk Prevention Plan  Transportation Screening Complete  PCP or Specialist Appt within 5-7 Days Complete  Home Care Screening Complete  Medication Review (RN CM) Complete

## 2022-09-06 NOTE — Plan of Care (Addendum)
Patient is being discharged to SNF, IV and tele removed. Foley to remain in place. Discharge Package was given to transport. Report called to [phone] spoke with [place]. Son followed patient to the facility

## 2022-09-06 NOTE — Progress Notes (Signed)
Patient assisted with lunch, she consumed 50% and tolerated it well

## 2022-09-06 NOTE — Progress Notes (Signed)
Manufacturing engineer [facility]) Hospital Liaison Note  MSW notified by TOC/Meredith that family has elected to reinstate hospice benefit as they are no longer seeking SNF rehab. MSW voiced understanding and appropriate [facility] staff has been made aware.   Please do not hesitate to call with any hospice related questions.    Thank you for the opportunity to participate in this patient's care.  Daphene Calamity, MSW [facility] Liaison  [phone]

## 2022-09-06 NOTE — Progress Notes (Signed)
PROGRESS NOTE Nancy [name]  WUJ:[id number] DOB: 17-Jul-1951 DOA: 09/01/2022 PCP: Pcp, No   Brief Narrative/Hospital Course: 14F w/ Hx dementia, HTN, HLD admit with altered mental status and a fall with right hip fracture s/p ORIF IMN 10/1 [facility]).  Patient also had questionable UTI treated with Ceftriaxone and admitted.  Patient is a currently hospice patient with diagnosis of severe protein calorie malnutrition and dementia. Palliative care was consulted family/patient rescinded hospice and planning for rehab.  Initial work-up with CT head, CT C-spine chest x-ray in the ED along with right hip pelvis x-ray showing right femoral fracture.  Status post ORIF.  At this time surgically stable hemoglobin holding stable, PT OT following recommending skilled nursing facility.  PTA she was a hospice patient. Hospice has been rescinded, as per her insurance or benefit will kick in on first of next month when she will be offered for skilled nursing facility.    Subjective: Seen and examined this morning alert awake confused at baseline.  No complaints.     Assessment and Plan: Principal Problem:   Closed right hip fracture (HCC) Active Problems:   Acute metabolic encephalopathy   Hyperlipidemia   Hypertension   Dementia (HCC)   Normocytic anemia   SIRS (systemic inflammatory response syndrome) (HCC)   Acute urinary retention   Elevated LFTs   Protein-calorie malnutrition, severe   Hypokalemia   Acute postoperative anemia due to expected blood loss   Right hip fracture secondary to fall status post ORIF 10/1: Continue to replete vitamin D continue aspirin twice daily for DVT prophylaxis continue Tylenol oxycodone for pain control.  TTWB RLE, PT OT per orthopedics.  PTOT following recommending skilled nursing facility-Hospice has been rescinded, as per her insurance or benefit will kick in on first of next month when she will be offered for skilled nursing facility.  Goals of care Advanced  dementia Severe Protein malnutrition Currently a hospice patient-now rescinded Continue supportive care delirium precaution Zyprexa.minimize opiates and benzodiazepines.  PT OT consultED.  family wants to removing from hospice to consider for SNF.Currently full code.  Palliative care/[facility] care hospice following-advised outpatient palliative care follow-up.   Rib fracture incidentally noted-minimal angulation posterior right fourth through seventh ribs which are age-indeterminate likely subacute or older with no pneumothorax.Supportive care, pain control with Tylenol, incentive spirometry  Postoperative acute blood loss anemia Anemia of chronic disease:Keep hemoglobin above 7 g.  Stable and monitor as below intermittently. Recent Labs  Lab 09/02/22 0717 09/03/22 0555 09/03/22 0956 09/03/22 1757 09/04/22 0556  HGB 8.3* SPECIMEN CONTAMINATED, UNABLE TO PERFORM TEST(S). 7.4* 9.7* 9.8*  HCT 26.0* SPECIMEN CONTAMINATED, UNABLE TO PERFORM TEST(S). 23.1* 30.6* 30.2*     Vitamin D deficiency at 20-started weekly replacement high-dose Hypokalemia repleted UTI/urine retention: More than 400 cc unsuccessful Foley attempts in the ED, urology consulted Dr. Alvester Morin placed Foley 10/1 completed 3 days of antibiotics, continue Foley catheter  at least for 5 days and continue until she is able to mobilize more prior to voiding trial  Hypertension/HLD: BP stable not needing meds.  Not on a statin.   DVT prophylaxis: SCDs Start: 09/01/22 2330 Place TED hose Start: 09/01/22 2330 SCDs Start: 09/01/22 1845 Code Status:   Code Status: Full Code Family Communication: plan of care discussed with patient at bedside. Patient status is: Inpatient because of hip fracture Level of care: Telemetry   Dispo: The patient is from: Hospice            Anticipated disposition: awaiting SNF.Stable medically.  Objective: Vitals last 24 hrs: Vitals:   09/05/22 2150 09/05/22 2316 09/06/22 0314 09/06/22 0703  BP:  (!) 145/74 130/68 (!) 135/58 127/69  Pulse: (!) 123 (!) 108 (!) 103 (!) 120  Resp:  16 16 16   Temp:  98.5 F (36.9 C) 99.1 F (37.3 C) 97.7 F (36.5 C)  TempSrc:  Oral Oral Oral  SpO2: 98% 98% 98% 100%  Weight:      Height:       Weight change:   Physical Examination: General exam: AAox1, older than stated age, weak appearing. HEENT:Oral mucosa moist, Ear/Nose WNL grossly, dentition normal. Respiratory system: bilaterally diminished, no use of accessory muscle Cardiovascular system: S1 & S2 +, No JVD,. Gastrointestinal system: Abdomen soft,NT,ND,BS+ Nervous System:Alert, awake, moving extremities and grossly nonfocal Extremities: LE ankle edema neg, surgical site on right leg with Aquacel dressing intact with bruises Skin: No rashes,no icterus. MSK: Normal muscle bulk,tone, power    Medications reviewed:  Scheduled Meds:  aspirin  81 mg Oral BID   Chlorhexidine Gluconate Cloth  6 each Topical Daily   citalopram  10 mg Oral Daily   docusate sodium  100 mg Oral BID   feeding supplement  237 mL Oral BID BM   multivitamin with minerals  1 tablet Oral Daily   OLANZapine  10 mg Oral QHS   Vitamin D (Ergocalciferol)  50,000 Units Oral Q7 days   Continuous Infusions:  sodium chloride 100 mL/hr at 09/05/22 1914   methocarbamol (ROBAXIN) IV 500 mg (09/03/22 0118)      Diet Order             Diet regular Room service appropriate? Yes; Fluid consistency: Thin  Diet effective now                    Nutrition Problem: Severe Malnutrition Etiology: chronic illness (dementia) Signs/Symptoms: severe fat depletion, severe muscle depletion Interventions: Ensure Enlive (each supplement provides 350kcal and 20 grams of protein), MVI   Intake/Output Summary (Last 24 hours) at 09/06/2022 1053 Last data filed at 09/06/2022 1004 Gross per 24 hour  Intake 268 ml  Output 3150 ml  Net -2882 ml    Net IO Since Admission: -1,365.4 mL [09/06/22 1053]  Wt Readings from Last 3  Encounters:  09/01/22 50.2 kg  01/13/22 55 kg  12/06/21 55.2 kg     Unresulted Labs (From admission, onward)     Start     Ordered   09/01/22 1337  SARS Coronavirus 2 by RT PCR (hospital order, performed in [facility] hospital lab) *cepheid single result test* Anterior Nasal Swab  (Tier 2 - Symptomatic/Asymptomatic)  Once,   URGENT        09/01/22 1336          Data Reviewed: I have personally reviewed following labs and imaging studies CBC: Recent Labs  Lab 09/01/22 0943 09/02/22 0717 09/03/22 0555 09/03/22 0956 09/03/22 1757 09/04/22 0556  WBC 13.4* 12.4* SPECIMEN CONTAMINATED, UNABLE TO PERFORM TEST(S). 9.5  --  8.9  NEUTROABS 11.2*  --   --   --   --   --   HGB 9.1* 8.3* SPECIMEN CONTAMINATED, UNABLE TO PERFORM TEST(S). 7.4* 9.7* 9.8*  HCT 27.9* 26.0* SPECIMEN CONTAMINATED, UNABLE TO PERFORM TEST(S). 23.1* 30.6* 30.2*  MCV 90.6 92.9 SPECIMEN CONTAMINATED, UNABLE TO PERFORM TEST(S). 92.0  --  92.4  PLT 200 178 SPECIMEN CONTAMINATED, UNABLE TO PERFORM TEST(S). 167  --  170    Basic Metabolic Panel: Recent  Labs  Lab 09/01/22 0943 09/02/22 0717 09/03/22 0956 09/04/22 0556  NA 141 141 144 137  K 3.5 4.5 3.4* 3.9  CL 111 112* 117* 107  CO2 27 24 24 23   GLUCOSE 114* 137* 122* 95  BUN 17 17 16 11   CREATININE 0.79 0.77 0.65 0.61  CALCIUM 8.3* 7.9* 7.6* 7.6*  MG  --   --   --  2.0    GFR: Estimated Creatinine Clearance: 50.4 mL/min (by C-G formula based on SCr of 0.61 mg/dL). Liver Function Tests: Recent Labs  Lab 09/01/22 0943  AST 112*  ALT 37  ALKPHOS 64  BILITOT 1.1  PROT 6.3*  ALBUMIN 3.3*    No results for input(s): "LIPASE", "AMYLASE" in the last 168 hours. No results for input(s): "AMMONIA" in the last 168 hours. Coagulation Profile: Recent Labs  Lab 09/01/22 1450  INR 1.2    BNP (last 3 results) No results for input(s): "PROBNP" in the last 8760 hours. HbA1C: No results for input(s): "HGBA1C" in the last 72 hours. CBG: Recent Labs   Lab 09/01/22 0942  GLUCAP 112*    Lipid Profile: No results for input(s): "CHOL", "HDL", "LDLCALC", "TRIG", "CHOLHDL", "LDLDIRECT" in the last 72 hours. Thyroid Function Tests: No results for input(s): "TSH", "T4TOTAL", "FREET4", "T3FREE", "THYROIDAB" in the last 72 hours. Sepsis Labs: Recent Labs  Lab 09/01/22 1620  LATICACIDVEN 0.9     Recent Results (from the past 240 hour(s))  Urine Culture     Status: Abnormal   Collection Time: 09/01/22  4:36 PM   Specimen: Urine, Clean Catch  Result Value Ref Range Status   Specimen Description   Final    URINE, CLEAN CATCH Performed at [facility], [place] [address] [place], [place] [address]    Special Requests   Final    NONE Performed at [facility], [place] [address] [place], [place] [address]    Culture (A)  Final    <10,000 COLONIES/mL INSIGNIFICANT GROWTH Performed at [place] [address], [place], [place] [address]    Report Status 09/02/2022 FINAL  Final    Antimicrobials: Anti-infectives (From admission, onward)    Start     Dose/Rate Route Frequency Ordered Stop   09/02/22 0030  ceFAZolin (ANCEF) IVPB 2g/100 mL premix        2 g 200 mL/hr over 30 Minutes Intravenous Every 8 hours 09/01/22 2329 09/02/22 1048   09/01/22 1545  cefTRIAXone (ROCEPHIN) 1 g in sodium chloride 0.9 % 100 mL IVPB        1 g 200 mL/hr over 30 Minutes Intravenous Every 24 hours 09/01/22 1538 09/03/22 1650      Culture/Microbiology    Component Value Date/Time   SDES  09/01/2022 1636    URINE, CLEAN CATCH Performed at [facility], [place] [address] [place], [place] [address]    SPECREQUEST  09/01/2022 1636    NONE Performed at [facility], [place] [address] [place], [place] [address]    CULT (A) 09/01/2022 1636    <10,000 COLONIES/mL INSIGNIFICANT GROWTH Performed at [place] [address] [place], [place] [address]    REPTSTATUS  09/02/2022 FINAL 09/01/2022 1636    Other culture-see note  Radiology Studies: No results found.   LOS: 5 days   Antonieta Pert, MD [facility]  09/06/2022, 10:53 AM

## 2022-09-06 NOTE — Progress Notes (Signed)
Patient is alert to self and she appears stable. There is bruising to the right inner upper thigh, patient is aware of situation. Foley is in place and has adequate output. Patient was able to take pills whole in applesauce without difficulty.

## 2022-09-08 DIAGNOSIS — S72141A Displaced intertrochanteric fracture of right femur, initial encounter for closed fracture: Secondary | ICD-10-CM

## 2022-09-20 ENCOUNTER — Encounter: Payer: Medicare HMO | Admitting: Orthopedic Surgery

## 2022-09-25 ENCOUNTER — Encounter: Payer: Medicare HMO | Admitting: Orthopedic Surgery

## 2022-09-25 ENCOUNTER — Emergency Department (HOSPITAL_BASED_OUTPATIENT_CLINIC_OR_DEPARTMENT_OTHER): Admitting: Radiology

## 2022-09-25 ENCOUNTER — Emergency Department (HOSPITAL_BASED_OUTPATIENT_CLINIC_OR_DEPARTMENT_OTHER)
Admission: EM | Admit: 2022-09-25 | Discharge: 2022-09-25 | Disposition: A | Discharge status: Home / Self Care | Attending: Emergency Medicine | Admitting: Emergency Medicine

## 2022-09-25 ENCOUNTER — Other Ambulatory Visit: Payer: Self-pay

## 2022-09-25 ENCOUNTER — Encounter (HOSPITAL_BASED_OUTPATIENT_CLINIC_OR_DEPARTMENT_OTHER): Payer: Self-pay | Admitting: Emergency Medicine

## 2022-09-25 ENCOUNTER — Emergency Department (HOSPITAL_BASED_OUTPATIENT_CLINIC_OR_DEPARTMENT_OTHER)

## 2022-09-25 DIAGNOSIS — S0990XA Unspecified injury of head, initial encounter: Secondary | ICD-10-CM

## 2022-09-25 DIAGNOSIS — S0101XA Laceration without foreign body of scalp, initial encounter: Secondary | ICD-10-CM | POA: Insufficient documentation

## 2022-09-25 DIAGNOSIS — W19XXXA Unspecified fall, initial encounter: Secondary | ICD-10-CM | POA: Insufficient documentation

## 2022-09-25 DIAGNOSIS — S5001XA Contusion of right elbow, initial encounter: Secondary | ICD-10-CM | POA: Diagnosis not present

## 2022-09-25 DIAGNOSIS — F03B0 Unspecified dementia, moderate, without behavioral disturbance, psychotic disturbance, mood disturbance, and anxiety: Secondary | ICD-10-CM | POA: Insufficient documentation

## 2022-09-25 DIAGNOSIS — R7309 Other abnormal glucose: Secondary | ICD-10-CM | POA: Insufficient documentation

## 2022-09-25 DIAGNOSIS — M9701XA Periprosthetic fracture around internal prosthetic right hip joint, initial encounter: Secondary | ICD-10-CM | POA: Insufficient documentation

## 2022-09-25 DIAGNOSIS — Z7982 Long term (current) use of aspirin: Secondary | ICD-10-CM | POA: Diagnosis not present

## 2022-09-25 NOTE — ED Provider Notes (Signed)
[facility] Provider Note   CSN: [id number] Arrival date & time: 09/25/22  1156     History  Chief Complaint  Patient presents with   Lytle Michaels    Nancy [name] is a 71 y.o. female.  Patient presents EMS from [facility] after unwitnessed fall.  Patient laceration to posterior scalp, abrasion to right elbow and possibly hit her hip.  Patient at baseline with dementia history.  No other concerning reports from staff that transition care to EMS.  No family at bedside.       Home Medications Prior to Admission medications   Medication Sig Start Date End Date Taking? Authorizing Provider  acetaminophen (TYLENOL) 500 MG tablet Take 500 mg by mouth every 6 (six) hours as needed for mild pain, fever or headache.   Yes [provider]  aspirin 81 MG chewable tablet Chew 1 tablet (81 mg total) by mouth 2 (two) times daily for 25 days. 09/06/22 10/01/22 Yes Antonieta Pert, MD  citalopram (CELEXA) 10 MG tablet Take 10 mg by mouth daily. 07/20/20  Yes [provider]  divalproex (DEPAKOTE) 125 MG DR tablet Take 125 mg by mouth daily.   Yes [provider]  docusate sodium (COLACE) 100 MG capsule Take 1 capsule (100 mg total) by mouth 2 (two) times daily. 09/06/22  Yes Antonieta Pert, MD  doxycycline (VIBRA-TABS) 100 MG tablet Take 100 mg by mouth 2 (two) times daily.   Yes [provider]  loperamide (IMODIUM) 2 MG capsule Take 2 mg by mouth as needed (with each loose stool- cannot exceed 8 doses/24 hours).   Yes [provider]  magnesium hydroxide (MILK OF MAGNESIA) 400 MG/5ML suspension Take 30 mLs by mouth at bedtime as needed for mild constipation.   Yes [provider]  MI-ACID 200-200-20 MG/5ML suspension Take 30 mLs by mouth every 6 (six) hours as needed for indigestion or heartburn.   Yes [provider]  OLANZapine (ZYPREXA) 10 MG tablet Take 10 mg by mouth daily. 08/17/20  Yes [provider]   feeding supplement (ENSURE ENLIVE / ENSURE PLUS) LIQD Take 237 mLs by mouth 2 (two) times daily between meals. 09/06/22   Antonieta Pert, MD  neomycin-bacitracin-polymyxin [phone] OINT Apply 1 Application topically as needed (to minor abrasions after cleaning with normal saline- cover with a Band-Aid or Gauze & tape).    [provider]  ROBAFEN 100 MG/5ML liquid Take 10 mLs by mouth every 6 (six) hours as needed for cough.    [provider]  Vitamin D, Ergocalciferol, (DRISDOL) 1.25 MG (50000 UNIT) CAPS capsule Take 1 capsule (50,000 Units total) by mouth every 7 (seven) days for 4 doses. 09/10/22 10/02/22  Antonieta Pert, MD      Allergies    Cefdinir    Review of Systems   Review of Systems  Unable to perform ROS: Dementia    Physical Exam Updated Vital Signs BP (!) 104/39   Pulse 86   Temp 97.8 F (36.6 C) (Axillary)   Resp 16   Ht 5\' 2"  (1.575 m)   Wt 48.4 kg   LMP  (LMP Unknown)   SpO2 98%   BMI 19.52 kg/m  Physical Exam Vitals and nursing note reviewed.  Constitutional:      General: She is not in acute distress.    Appearance: She is well-developed.  HENT:     Head: Normocephalic.     Comments: Patient has 1 cm with mild gaping laceration with mild  bleeding posterior upper scalp no step-off.    Mouth/Throat:     Mouth: Mucous membranes are moist.  Eyes:     General:        Right eye: No discharge.        Left eye: No discharge.     Conjunctiva/sclera: Conjunctivae normal.  Neck:     Trachea: No tracheal deviation.  Cardiovascular:     Rate and Rhythm: Normal rate and regular rhythm.     Heart sounds: No murmur heard. Pulmonary:     Effort: Pulmonary effort is normal.     Breath sounds: Normal breath sounds.  Abdominal:     General: There is no distension.     Palpations: Abdomen is soft.     Tenderness: There is no abdominal tenderness. There is no guarding.  Musculoskeletal:        General: Tenderness present. No swelling.     Cervical  back: Neck supple. No rigidity or tenderness.     Comments: No obvious discomfort to flexion extension of lower extremities hips knees or ankles.  Patient has mild discomfort lateral right elbow with mild superficial abrasion and ecchymosis no joint effusion.  Patient has no other tenderness to major joints in the upper extremities bilateral.  Minimal discomfort with flexion right hip.  No midline cervical tenderness c-collar in place.  Skin:    General: Skin is warm.     Capillary Refill: Capillary refill takes less than 2 seconds.     Findings: Rash present.  Neurological:     General: No focal deficit present.     Mental Status: She is alert.     Comments: Pleasant dementia, patient has general weakness, moves extremities equal bilateral with mild weakness.  Pupils equal bilateral.  Psychiatric:     Comments: Pleasant dementia     ED Results / Procedures / Treatments   Labs (all labs ordered are listed, but only abnormal results are displayed) Labs Reviewed  CBG MONITORING, ED  CBG MONITORING, ED    EKG None  Radiology DG Elbow Complete Right  Result Date: 09/25/2022 CLINICAL DATA:  Fall.  Pain. EXAM: RIGHT ELBOW - COMPLETE 3+ VIEW COMPARISON:  None Available. FINDINGS: There is no evidence of fracture, dislocation, or joint effusion. There is no evidence of arthropathy or other focal bone abnormality. Soft tissues are unremarkable. IMPRESSION: Negative. Electronically Signed   By: Kennith Center M.D.   On: 09/25/2022 14:14   DG Hip Unilat W or Wo Pelvis 2-3 Views Left  Result Date: 09/25/2022 CLINICAL DATA:  Fall. EXAM: DG HIP (WITH OR WITHOUT PELVIS) 2-3V LEFT COMPARISON:  None Available. FINDINGS: There is no evidence of hip fracture or dislocation. There is no evidence of arthropathy or other focal bone abnormality. IMPRESSION: Negative. Electronically Signed   By: Larose Hires D.O.   On: 09/25/2022 14:02   DG Hip Unilat W or Wo Pelvis 2-3 Views Right  Result Date:  09/25/2022 CLINICAL DATA:  Fall. EXAM: DG HIP (WITH OR WITHOUT PELVIS) 2-3V RIGHT COMPARISON:  Radiographs dated September 02, 2022 FINDINGS: Intramedullary rod with two proximal screws in the femoral neck. Linear lucency about the lateral aspect of the proximal femur suggesting acute periprosthetic fracture soft tissue swelling about the hip joint. IMPRESSION: Acute periprosthetic fracture about the proximal femur. Electronically Signed   By: Larose Hires D.O.   On: 09/25/2022 14:01   CT Head Wo Contrast  Result Date: 09/25/2022 CLINICAL DATA:  head injury, dementia; head injury EXAM: CT  HEAD WITHOUT CONTRAST CT CERVICAL SPINE WITHOUT CONTRAST TECHNIQUE: Multidetector CT imaging of the head and cervical spine was performed following the standard protocol without intravenous contrast. Multiplanar CT image reconstructions of the cervical spine were also generated. RADIATION DOSE REDUCTION: This exam was performed according to the departmental dose-optimization program which includes automated exposure control, adjustment of the mA and/or kV according to patient size and/or use of iterative reconstruction technique. COMPARISON:  None Available. FINDINGS: CT HEAD FINDINGS Brain: No evidence of acute infarction, hemorrhage, hydrocephalus, extra-axial collection or mass lesion/mass effect. Vascular: No hyperdense vessel identified. Skull: No acute fracture. Sinuses/Orbits: Clear sinuses.  No acute orbital findings. Other: No mastoid effusions. CT CERVICAL SPINE FINDINGS Alignment: Mild anterolisthesis of C4 on C5, favored to be degenerative in etiology given facet arthropathy at this level. Skull base and vertebrae: Vertebral heights are maintained. No evidence of acute fracture. Soft tissues and spinal canal: No prevertebral fluid or swelling. No visible canal hematoma. Disc levels: Moderate multilevel degenerative change. Degenerative disc disease greatest at C5-C6. Disc bulging at multiple levels. Upper chest:  Visualized lung apices are clear. IMPRESSION: 1. No evidence of acute intracranial abnormality. 2. No evidence of acute fracture or traumatic malalignment in the cervical spine. Electronically Signed   By: Feliberto Harts M.D.   On: 09/25/2022 13:39   CT Cervical Spine Wo Contrast  Result Date: 09/25/2022 CLINICAL DATA:  head injury, dementia; head injury EXAM: CT HEAD WITHOUT CONTRAST CT CERVICAL SPINE WITHOUT CONTRAST TECHNIQUE: Multidetector CT imaging of the head and cervical spine was performed following the standard protocol without intravenous contrast. Multiplanar CT image reconstructions of the cervical spine were also generated. RADIATION DOSE REDUCTION: This exam was performed according to the departmental dose-optimization program which includes automated exposure control, adjustment of the mA and/or kV according to patient size and/or use of iterative reconstruction technique. COMPARISON:  None Available. FINDINGS: CT HEAD FINDINGS Brain: No evidence of acute infarction, hemorrhage, hydrocephalus, extra-axial collection or mass lesion/mass effect. Vascular: No hyperdense vessel identified. Skull: No acute fracture. Sinuses/Orbits: Clear sinuses.  No acute orbital findings. Other: No mastoid effusions. CT CERVICAL SPINE FINDINGS Alignment: Mild anterolisthesis of C4 on C5, favored to be degenerative in etiology given facet arthropathy at this level. Skull base and vertebrae: Vertebral heights are maintained. No evidence of acute fracture. Soft tissues and spinal canal: No prevertebral fluid or swelling. No visible canal hematoma. Disc levels: Moderate multilevel degenerative change. Degenerative disc disease greatest at C5-C6. Disc bulging at multiple levels. Upper chest: Visualized lung apices are clear. IMPRESSION: 1. No evidence of acute intracranial abnormality. 2. No evidence of acute fracture or traumatic malalignment in the cervical spine. Electronically Signed   By: Feliberto Harts M.D.    On: 09/25/2022 13:39    Procedures .Marland KitchenLaceration Repair  Date/Time: 09/25/2022 3:03 PM  Performed by: Blane Ohara, MD Authorized by: Blane Ohara, MD   Consent:    Consent obtained:  Emergent situation   Risks, benefits, and alternatives were discussed: yes     Risks discussed:  Infection and pain   Alternatives discussed:  No treatment Anesthesia:    Anesthesia method:  None Laceration details:    Location:  Scalp   Scalp location:  Occipital   Length (cm):  2   Depth (mm):  10 Pre-procedure details:    Preparation:  Patient was prepped and draped in usual sterile fashion and imaging obtained to evaluate for foreign bodies Exploration:    Imaging outcome: foreign body not noted  Wound exploration: wound explored through full range of motion     Wound extent: areolar tissue not violated, fascia not violated and no foreign body   Treatment:    Area cleansed with:  Chlorhexidine   Amount of cleaning:  Standard   Debridement:  None   Undermining:  None   Scar revision: no   Skin repair:    Repair method:  Staples   Number of staples:  2 Approximation:    Approximation:  Close Repair type:    Repair type:  Simple Post-procedure details:    Dressing:  Open (no dressing)   Procedure completion:  Tolerated     Medications Ordered in ED Medications - No data to display  ED Course/ Medical Decision Making/ A&P                           Medical Decision Making Amount and/or Complexity of Data Reviewed Radiology: ordered. ECG/medicine tests: ordered.   Patient with dementia history at baseline presents after fall at [facility] house.  Laceration of scalp repaired by myself with 2 staples, bleeding controlled, clean/wound care provided.  CT scan of the head and neck ordered and independently reviewed no acute fractures or bleeding or skull fracture.  X-ray of right elbow and right hip ordered and independently reviewed showing periprosthetic fracture of the right  hip.  Discussed with Dr. August Saucer orthopedics who recommended continued nonweightbearing and follow-up in approximately week.  Patient at baseline afebrile, no indication for blood work it was canceled as it was only ordered in case she needed urgent surgery.  Attempted to call patient's son who is primary contact but no response.  Nursing will also attempt and we are calling nursing home for close outpatient follow-up and discharge.        Final Clinical Impression(s) / ED Diagnoses Final diagnoses:  Moderate dementia, unspecified dementia type, unspecified whether behavioral, psychotic, or mood disturbance or anxiety (HCC)  Fall, initial encounter  Contusion of right elbow, initial encounter  Acute head injury, initial encounter  Laceration of scalp, initial encounter  Periprosthetic fracture around internal prosthetic right hip joint, initial encounter [facility])    Rx / DC Orders ED Discharge Orders     None         Blane Ohara, MD 09/25/22 1504

## 2022-09-25 NOTE — ED Triage Notes (Signed)
Pt received from EMS. [facility]. Small laceration on R back of head, small abrasion to R elbow and reported pt "hit her hip".

## 2022-09-25 NOTE — ED Notes (Signed)
Pt, belongings, paperwork transferred back to patient facility at this time via [facility]. Facility made aware by previous Therapist, sports.

## 2022-09-25 NOTE — ED Notes (Signed)
Assisted patient with eating a meal and drinking fluids

## 2022-09-25 NOTE — ED Notes (Signed)
Blood sugar taken at 1255 of 120. Did not cross over for some reason. MD aware.

## 2022-09-25 NOTE — ED Notes (Signed)
Pt has been a little more restless and attempting out of bed. Confused and disoriented.

## 2022-09-25 NOTE — Discharge Instructions (Addendum)
Have staples removed in approximately 10 days. Gentle soap and water for wound care. Nonweightbearing until you are cleared by orthopedic physician, call for appointment approximate 1 week you may be able to have staples removed at that appointment as well. Use Tylenol every 4 hours as needed for pain or fever. Return for new or worsening signs or symptoms.

## 2022-09-25 NOTE — ED Notes (Signed)
Attempted to call son with an update. Notified facility of no answer. [facility] called for patient to transfer back to facility. Will continue to monitor pt closely.

## 2022-09-25 NOTE — ED Notes (Signed)
Thayer Headings @ [facility] has been called to transport to [facility]. ABB(NS)

## 2022-09-26 ENCOUNTER — Encounter: Payer: Medicare HMO | Admitting: Orthopedic Surgery

## 2022-09-30 LAB — CBG MONITORING, ED: Glucose-Capillary: 112 mg/dL — ABNORMAL HIGH (ref 70–99)

## 2022-10-02 ENCOUNTER — Telehealth: Payer: Self-pay | Admitting: Orthopedic Surgery

## 2022-10-02 NOTE — Telephone Encounter (Signed)
Received call from [facility] with [facility] needing to reschedule patient for post op visit.  The number to contact Burna Mortimer is [phone]

## 2022-10-03 NOTE — Telephone Encounter (Signed)
IC rescheduled 

## 2022-10-04 ENCOUNTER — Ambulatory Visit (INDEPENDENT_AMBULATORY_CARE_PROVIDER_SITE_OTHER): Payer: Medicare HMO | Admitting: Surgical

## 2022-10-04 ENCOUNTER — Ambulatory Visit (INDEPENDENT_AMBULATORY_CARE_PROVIDER_SITE_OTHER): Payer: Medicare HMO

## 2022-10-04 DIAGNOSIS — M79604 Pain in right leg: Secondary | ICD-10-CM

## 2022-10-06 ENCOUNTER — Encounter: Payer: Self-pay | Admitting: Surgical

## 2022-10-06 NOTE — Progress Notes (Signed)
Post-Op Visit Note   Patient: Nancy [name]           Date of Birth: 03-05-1951           MRN: [id number] Visit Date: 10/04/2022 PCP: System, Provider Not In   Assessment & Plan:  Chief Complaint:  Chief Complaint  Patient presents with   Right Leg - Routine Post Op   Visit Diagnoses:  1. Pain in right leg     Plan: Patient is a 71 year old female who presents s/p right intramedullary femoral nail for intertrochanteric femur fracture on 09/01/2022.  She has history of significant dementia and history is limited due to this.  She has her aide from her skilled nursing facility present to help provide history as well as phone dialogue with another aide that works at the facility.  She has been progressing with physical therapy but did have another injury about 10 days ago when she had another fall that required laceration repair of her scalp and she had reinjury to the operative leg with new fracture noted on radiographs at that ED visit on 10/25.  New radiographs taken today demonstrate callus formation surrounding the fracture site with persistent periprosthetic fracture noted but in an area that is in a stable position due to the intramedullary nail.  There is no significant change in fracture appearance since radiographs on 10/25.  On exam, she has incisions that are healing well without evidence of infection or dehiscence.  She has no calf tenderness or swelling consistent with DVT.  She is taking aspirin daily according to nursing facility.  She has minimal discomfort with passive internal rotation/external rotation/flexion of the operative hip.  Plan is to continue weightbearing as tolerated with walker.  Continue mobilization with PT.  During x-ray she made attempts by herself to stand up and get off the table and was continually trying to walk around during her visit in the office today.  Plan for final check back with Dr. Marlou Sa in 4 weeks with new radiographs at that time.  Follow-Up  Instructions: No follow-ups on file.   Orders:  Orders Placed This Encounter  Procedures   XR FEMUR, MIN 2 VIEWS RIGHT   No orders of the defined types were placed in this encounter.   Imaging: No results found.  PMFS History: Patient Active Problem List   Diagnosis Date Noted   Closed displaced intertrochanteric fracture of right femur ([place])    Hypokalemia 09/03/2022   Acute postoperative anemia due to expected blood loss 09/03/2022   Protein-calorie malnutrition, severe 09/02/2022   Closed right hip fracture ([place]) 09/01/2022   SIRS (systemic inflammatory response syndrome) ([place]) 09/01/2022   Acute urinary retention 09/01/2022   Elevated LFTs 09/01/2022   Dysphagia 12/06/2021   Stage I pressure ulcer of sacral region 78/93/8101   Acute metabolic encephalopathy 75/09/2584   Aortic atherosclerosis ([place]) 12/06/2021   Pressure injury of skin 12/05/2021   Malnutrition of moderate degree 12/05/2021   Demand ischemia 12/04/2021   Thoracic compression fracture ([place]) 12/04/2021   Hyperlipidemia    Hypertension    Dementia (HCC)    Thrombocytosis    Normocytic anemia    Mild protein malnutrition ([place])    Past Medical History:  Diagnosis Date   Dementia ([place])    Hyperlipidemia    Hypertension     No family history on file.  Past Surgical History:  Procedure Laterality Date   INTRAMEDULLARY (IM) NAIL INTERTROCHANTERIC Right 09/01/2022   Procedure: INTRAMEDULLARY (IM) NAIL INTERTROCHANTERIC;  Surgeon: Cammy Copa, MD;  Location: [facility] ORS;  Service: Orthopedics;  Laterality: Right;   Social History   Occupational History   Not on file  Tobacco Use   Smoking status: Unknown   Smokeless tobacco: Not on file  Vaping Use   Vaping Use: Unknown  Substance and Sexual Activity   Alcohol use: Never   Drug use: Never   Sexual activity: Not on file    Comment: UTA

## 2022-10-11 ENCOUNTER — Telehealth: Payer: Self-pay

## 2022-10-11 NOTE — Telephone Encounter (Signed)
Nancy [name] with [facility] would like an order for a mobile x-ray due to swelling in her right leg.  Cb# [id number] or [phone] for triage nurse and can leave a message.  Please advise.  Thank you.

## 2022-10-11 NOTE — Telephone Encounter (Signed)
Ok for this, am I able to view the results?

## 2022-10-11 NOTE — Telephone Encounter (Signed)
IC advised. Melia will try to have mobile xray to send images and fax report

## 2022-10-14 ENCOUNTER — Telehealth: Payer: Self-pay | Admitting: Radiology

## 2022-10-14 NOTE — Telephone Encounter (Signed)
Voicemail left on triage line:  Melia, RN with [facility] called to see if we have received results from mobile xray that was done and what Dr. August Saucer would like for them to do.    Please call Melia at [phone]

## 2022-10-15 NOTE — Telephone Encounter (Signed)
Received results today. Holding for Dr Dean/Luke to advise on Wednesday when they return back to clinic

## 2022-10-16 NOTE — Telephone Encounter (Signed)
Called and left VM

## 2022-11-01 ENCOUNTER — Encounter: Payer: Medicare HMO | Admitting: Surgical

## 2022-11-04 ENCOUNTER — Ambulatory Visit (INDEPENDENT_AMBULATORY_CARE_PROVIDER_SITE_OTHER): Payer: Medicare HMO | Admitting: Orthopedic Surgery

## 2022-11-04 ENCOUNTER — Telehealth: Payer: Self-pay | Admitting: Orthopedic Surgery

## 2022-11-04 ENCOUNTER — Ambulatory Visit (INDEPENDENT_AMBULATORY_CARE_PROVIDER_SITE_OTHER)

## 2022-11-04 ENCOUNTER — Encounter: Payer: Self-pay | Admitting: Orthopedic Surgery

## 2022-11-04 DIAGNOSIS — M79604 Pain in right leg: Secondary | ICD-10-CM

## 2022-11-04 NOTE — Telephone Encounter (Signed)
Patient son stated that Dr August Saucer needs to talk to him about his mom appointment. Best number to reach him [phone]

## 2022-11-05 NOTE — Progress Notes (Unsigned)
Post-Op Visit Note   Patient: Nancy [name]           Date of Birth: 03-30-1951           MRN: [id number] Visit Date: 11/04/2022 PCP: System, Provider Not In   Assessment & Plan:  Chief Complaint:  Chief Complaint  Patient presents with   Right Leg - Pain   Visit Diagnoses:  1. Pain in right leg     Plan: Truth is a 71 year old patient with dementia who underwent right hip intertrochanteric fracture fixation 09/01/2022.  Somewhat difficult to get her to follow weightbearing restrictions.  She has been at a skilled nursing facility.  On examination all incisions are healed.  She does have a little bit of pain in that right knee with range of motion the knee and the range of motion is restricted in the right knee compared to the left knee.  Radiographs do demonstrate migration of the nail distally with some shortening of the femur.  Abundant callus formation and heterotopic ossification is present around the proximal femur region.  No effusion in the knee.  The nail has migrated from its immediate postop appearance just proximal to the pole of the patella to about 1 and half centimeters below Blumenstock's line.  Plan at this time is 1 more month of limited weightbearing and then we will need to remove the nail to allow her knee to function with less pain.  She does like to ambulate a lot.  I have reached out to the son on several occasions and will continue to do so to try to explain the situation to him.  Not completely unexpected in this case based on her postoperative course.  The fracture does look healed at this time but I would like to to give it a full 3 months before performing any hardware removal.  Follow-Up Instructions: No follow-ups on file.   Orders:  Orders Placed This Encounter  Procedures   XR FEMUR, MIN 2 VIEWS RIGHT   No orders of the defined types were placed in this encounter.   Imaging: XR FEMUR, MIN 2 VIEWS RIGHT  Result Date: 11/05/2022 AP lateral  radiographs right femur reviewed.  Abundant heterotopic ossification is present around the proximal femur fracture.  There has been shortening of the fracture.  In comparison to immediate postop radiographs there has been distal migration of the intramedullary nail to about 1/2 cm below Blumensaat's line into the knee.  No acute fracture around the knee.  This is a change compared to immediate postop radiographs.   PMFS History: Patient Active Problem List   Diagnosis Date Noted   Closed displaced intertrochanteric fracture of right femur (HCC)    Hypokalemia 09/03/2022   Acute postoperative anemia due to expected blood loss 09/03/2022   Protein-calorie malnutrition, severe 09/02/2022   Closed right hip fracture (HCC) 09/01/2022   SIRS (systemic inflammatory response syndrome) (HCC) 09/01/2022   Acute urinary retention 09/01/2022   Elevated LFTs 09/01/2022   Dysphagia 12/06/2021   Stage I pressure ulcer of sacral region 12/06/2021   Acute metabolic encephalopathy 12/06/2021   Aortic atherosclerosis (HCC) 12/06/2021   Pressure injury of skin 12/05/2021   Malnutrition of moderate degree 12/05/2021   Demand ischemia 12/04/2021   Thoracic compression fracture (HCC) 12/04/2021   Hyperlipidemia    Hypertension    Dementia (HCC)    Thrombocytosis    Normocytic anemia    Mild protein malnutrition (HCC)    Past Medical History:  Diagnosis Date  Dementia ([place])    Hyperlipidemia    Hypertension     History reviewed. No pertinent family history.  Past Surgical History:  Procedure Laterality Date   INTRAMEDULLARY (IM) NAIL INTERTROCHANTERIC Right 09/01/2022   Procedure: INTRAMEDULLARY (IM) NAIL INTERTROCHANTERIC;  Surgeon: Meredith Pel, MD;  Location: [facility];  Service: Orthopedics;  Laterality: Right;   Social History   Occupational History   Not on file  Tobacco Use   Smoking status: Unknown   Smokeless tobacco: Not on file  Vaping Use   Vaping Use: Unknown  Substance and  Sexual Activity   Alcohol use: Never   Drug use: Never   Sexual activity: Not on file    Comment: UTA

## 2022-11-05 NOTE — Telephone Encounter (Signed)
I lmom

## 2022-11-11 NOTE — Telephone Encounter (Signed)
Lmom today and last fri

## 2022-11-27 ENCOUNTER — Telehealth: Payer: Self-pay | Admitting: Orthopedic Surgery

## 2022-11-27 NOTE — Telephone Encounter (Signed)
Patient is at [facility]. Nurse wanting to know if she is having surgery or just needs and appointment, please advise..( Deanna Artis) [phone]

## 2022-11-27 NOTE — Telephone Encounter (Signed)
Please advise. Does patient need follow up appointment in the office prior to scheduling hardware removal?

## 2022-11-28 NOTE — Telephone Encounter (Signed)
I think based on his last note, she can be scheduled for surgery at this point without an appointment.  I am not sure if he was able to ever get a hold of Nancy [name]'s family

## 2022-11-29 NOTE — Pre-Procedure Instructions (Signed)
    Nancy [name]  11/29/2022    [name]  procedure is scheduled on Tuesday, January 2.  Report to [facility] Admitting at 8:35 AM   Call this number if you have problems the morning of surgery:  [phone]- this is the pre- surgery desk.  >>>>>Please send patient's Medication Record with medications administrated documentation. ( this information is required prior to OR. This includes medications that may have been on hold for surgery)<<<<<   If you experience any cold or flu symptoms such as cough, fever, chills, shortness of breath, etc. between now and your scheduled surgery, please notify us at the above number.   Remember:  Do not eat  after midnight.  You may drink clear liquids until 8:00 AM .   Clear liquids allowed are:                    Water, Juice (No red color; non-citric and without pulp; diabetics please choose diet or no sugar options), Carbonated beverages (diabetics please choose diet or no sugar options), Clear Tea (No creamer, milk, or cream, including half & half and powdered creamer), Black Coffee Only (No creamer, milk or cream, including half & half and powdered creamer), Plain Jell-O Only (No red color; diabetics please choose no sugar options), Clear Sports drink (No red color; diabetics please choose diet or no sugar options), and Plain Popsicles Only (No red color; diabetics please choose no sugar options)    Take these medicines the morning of surgery with A SIP OF WATER: citalopram (CELEXA)   divalproex (DEPAKOTE) OLANZapine (ZYPREXA)  If needed: acetaminophen (TYLENOL)  operamide (IMODIUM)   [name] should have a shower, with antibacteria soap, the morning of surgery . Dry off with a clean towel.   Patient should not have lotions, powders, colognes, deodorant, jewelry, or piercing's, nail polish.Wear clean comfortable clothes.  Brush teeth.   Contacts, dentures or bridgework may not be worn into surgery.  Leave your suitcase in the  car.  After surgery it may be brought to your room.

## 2022-11-29 NOTE — Progress Notes (Addendum)
I spoke with Porfirio Mylar the Resident Care Coordinator. Porfirio Mylar states that Ms Kamphuis lives in [facility] at [facility], patient is oriented to herself. Ms Werden is non weight bearing at th is time. Porfirio Mylar read some blood pressures that had been taken on Ms Haluska: 110-126/ 78-8. Porfirio Mylar states that Ms Zentz has a son - Molli Hazard, he will be able to sign the consent.I will try to get Molli Hazard on the phone. I faxed instructions to [facility]. I have called Molli Hazard x 2, I left a message asking him to cal Pre- surgery desk on Saturday or Sunday and inform the staff if you are coming in, or if need t give a voice consent.  Adele Schilder a supervisor has the pre- op instructions and has no questions.

## 2022-12-02 ENCOUNTER — Telehealth (INDEPENDENT_AMBULATORY_CARE_PROVIDER_SITE_OTHER): Admitting: Orthopedic Surgery

## 2022-12-02 DIAGNOSIS — M25561 Pain in right knee: Secondary | ICD-10-CM

## 2022-12-02 NOTE — Telephone Encounter (Signed)
Lmom about upcoming surgery

## 2022-12-03 ENCOUNTER — Observation Stay (HOSPITAL_COMMUNITY)
Admission: RE | Admit: 2022-12-03 | Discharge: 2022-12-04 | Disposition: A | Discharge status: Skilled Nursing Facility | Source: Ambulatory Visit | Attending: Orthopedic Surgery | Admitting: Orthopedic Surgery

## 2022-12-03 ENCOUNTER — Ambulatory Visit (HOSPITAL_COMMUNITY): Admitting: Physician Assistant

## 2022-12-03 ENCOUNTER — Ambulatory Visit (HOSPITAL_BASED_OUTPATIENT_CLINIC_OR_DEPARTMENT_OTHER): Admitting: Physician Assistant

## 2022-12-03 ENCOUNTER — Ambulatory Visit (HOSPITAL_COMMUNITY)

## 2022-12-03 ENCOUNTER — Other Ambulatory Visit: Payer: Self-pay

## 2022-12-03 ENCOUNTER — Encounter (HOSPITAL_COMMUNITY)
Admission: RE | Disposition: A | Discharge status: Skilled Nursing Facility | Payer: Self-pay | Source: Ambulatory Visit | Attending: Orthopedic Surgery

## 2022-12-03 ENCOUNTER — Encounter (HOSPITAL_COMMUNITY): Payer: Self-pay | Admitting: Orthopedic Surgery

## 2022-12-03 ENCOUNTER — Observation Stay (HOSPITAL_COMMUNITY)

## 2022-12-03 DIAGNOSIS — D649 Anemia, unspecified: Secondary | ICD-10-CM

## 2022-12-03 DIAGNOSIS — Z79899 Other long term (current) drug therapy: Secondary | ICD-10-CM | POA: Insufficient documentation

## 2022-12-03 DIAGNOSIS — F0390 Unspecified dementia without behavioral disturbance: Secondary | ICD-10-CM | POA: Insufficient documentation

## 2022-12-03 DIAGNOSIS — T85848D Pain due to other internal prosthetic devices, implants and grafts, subsequent encounter: Secondary | ICD-10-CM

## 2022-12-03 DIAGNOSIS — T84092A Other mechanical complication of internal right knee prosthesis, initial encounter: Principal | ICD-10-CM | POA: Insufficient documentation

## 2022-12-03 DIAGNOSIS — Z9889 Other specified postprocedural states: Secondary | ICD-10-CM

## 2022-12-03 DIAGNOSIS — I10 Essential (primary) hypertension: Secondary | ICD-10-CM | POA: Insufficient documentation

## 2022-12-03 DIAGNOSIS — M25551 Pain in right hip: Secondary | ICD-10-CM | POA: Diagnosis not present

## 2022-12-03 DIAGNOSIS — R2689 Other abnormalities of gait and mobility: Secondary | ICD-10-CM | POA: Insufficient documentation

## 2022-12-03 DIAGNOSIS — R262 Difficulty in walking, not elsewhere classified: Secondary | ICD-10-CM | POA: Diagnosis not present

## 2022-12-03 DIAGNOSIS — Y828 Other medical devices associated with adverse incidents: Secondary | ICD-10-CM | POA: Diagnosis not present

## 2022-12-03 DIAGNOSIS — Z7982 Long term (current) use of aspirin: Secondary | ICD-10-CM | POA: Diagnosis not present

## 2022-12-03 DIAGNOSIS — Z01818 Encounter for other preprocedural examination: Secondary | ICD-10-CM

## 2022-12-03 DIAGNOSIS — T85848A Pain due to other internal prosthetic devices, implants and grafts, initial encounter: Secondary | ICD-10-CM

## 2022-12-03 DIAGNOSIS — T8484XA Pain due to internal orthopedic prosthetic devices, implants and grafts, initial encounter: Secondary | ICD-10-CM

## 2022-12-03 HISTORY — PX: HARDWARE REMOVAL: SHX979

## 2022-12-03 HISTORY — DX: Other specified postprocedural states: R11.2

## 2022-12-03 HISTORY — DX: Other specified postprocedural states: Z98.890

## 2022-12-03 LAB — BASIC METABOLIC PANEL
Anion gap: 4 — ABNORMAL LOW (ref 5–15)
BUN: 13 mg/dL (ref 8–23)
CO2: 27 mmol/L (ref 22–32)
Calcium: 8.8 mg/dL — ABNORMAL LOW (ref 8.9–10.3)
Chloride: 108 mmol/L (ref 98–111)
Creatinine, Ser: 0.6 mg/dL (ref 0.44–1.00)
GFR, Estimated: >60 mL/min (ref >60)
Glucose, Bld: 88 mg/dL (ref 70–99)
Potassium: 4.6 mmol/L (ref 3.5–5.1)
Sodium: 139 mmol/L (ref 135–145)

## 2022-12-03 LAB — CBC
HCT: 38.7 % (ref 36.0–46.0)
Hemoglobin: 11.9 g/dL — ABNORMAL LOW (ref 12.0–15.0)
MCH: 28.5 pg (ref 26.0–34.0)
MCHC: 30.7 g/dL (ref 30.0–36.0)
MCV: 92.8 fL (ref 80.0–100.0)
Platelets: 223 10*3/uL (ref 150–400)
RBC: 4.17 MIL/uL (ref 3.87–5.11)
RDW: 14.5 % (ref 11.5–15.5)
WBC: 5.5 10*3/uL (ref 4.0–10.5)
nRBC: 0 % (ref 0.0–0.2)

## 2022-12-03 SURGERY — REMOVAL, HARDWARE
Anesthesia: Spinal | Laterality: Right

## 2022-12-03 MED ORDER — DIVALPROEX SODIUM 125 MG PO CSDR
125.0000 mg | DELAYED_RELEASE_CAPSULE | Freq: Every day | ORAL | Status: DC
  Administered 2022-12-04: 125 mg via ORAL
  Filled 2022-12-03: qty 1

## 2022-12-03 MED ORDER — VANCOMYCIN HCL 1000 MG IV SOLR
INTRAVENOUS | Status: AC
  Filled 2022-12-03: qty 20

## 2022-12-03 MED ORDER — FENTANYL CITRATE (PF) 250 MCG/5ML IJ SOLN
INTRAMUSCULAR | Status: DC | PRN
  Administered 2022-12-03 (×6): 25 ug via INTRAVENOUS

## 2022-12-03 MED ORDER — 0.9 % SODIUM CHLORIDE (POUR BTL) OPTIME
TOPICAL | Status: DC | PRN
  Administered 2022-12-03: 500 mL

## 2022-12-03 MED ORDER — HYDROMORPHONE HCL 1 MG/ML IJ SOLN: 0.5000 mg | INTRAMUSCULAR | Status: DC | PRN

## 2022-12-03 MED ORDER — ASPIRIN 81 MG PO TBEC
81.0000 mg | DELAYED_RELEASE_TABLET | Freq: Two times a day (BID) | ORAL | Status: DC
  Administered 2022-12-03 – 2022-12-04 (×2): 81 mg via ORAL
  Filled 2022-12-03 (×2): qty 1

## 2022-12-03 MED ORDER — ONDANSETRON HCL 4 MG/2ML IJ SOLN
INTRAMUSCULAR | Status: DC | PRN
  Administered 2022-12-03: 4 mg via INTRAVENOUS

## 2022-12-03 MED ORDER — METOCLOPRAMIDE HCL 5 MG/ML IJ SOLN: 5.0000 mg | Freq: Three times a day (TID) | INTRAMUSCULAR | Status: DC | PRN

## 2022-12-03 MED ORDER — ONDANSETRON HCL 4 MG/2ML IJ SOLN
INTRAMUSCULAR | Status: AC
  Filled 2022-12-03: qty 2

## 2022-12-03 MED ORDER — OLANZAPINE 5 MG PO TABS
5.0000 mg | ORAL_TABLET | Freq: Two times a day (BID) | ORAL | Status: DC
  Administered 2022-12-03 – 2022-12-04 (×2): 5 mg via ORAL
  Filled 2022-12-03 (×3): qty 1

## 2022-12-03 MED ORDER — LACTATED RINGERS IV SOLN: INTRAVENOUS | Status: DC

## 2022-12-03 MED ORDER — MORPHINE SULFATE (PF) 4 MG/ML IV SOLN
INTRAVENOUS | Status: DC | PRN
  Administered 2022-12-03: 20 mL

## 2022-12-03 MED ORDER — ORAL CARE MOUTH RINSE: 15.0000 mL | Freq: Once | OROMUCOSAL | Status: AC

## 2022-12-03 MED ORDER — LOPERAMIDE HCL 2 MG PO CAPS: 2.0000 mg | ORAL_CAPSULE | ORAL | Status: DC | PRN

## 2022-12-03 MED ORDER — VANCOMYCIN HCL 1000 MG IV SOLR
INTRAVENOUS | Status: DC | PRN
  Administered 2022-12-03: 1000 mg

## 2022-12-03 MED ORDER — FENTANYL CITRATE (PF) 250 MCG/5ML IJ SOLN
INTRAMUSCULAR | Status: AC
  Filled 2022-12-03: qty 5

## 2022-12-03 MED ORDER — POVIDONE-IODINE 10 % EX SWAB
2.0000 | Freq: Once | CUTANEOUS | Status: AC
  Administered 2022-12-03: 2 via TOPICAL

## 2022-12-03 MED ORDER — MENTHOL 3 MG MT LOZG: 1.0000 | LOZENGE | OROMUCOSAL | Status: DC | PRN

## 2022-12-03 MED ORDER — METHOCARBAMOL 1000 MG/10ML IJ SOLN: 500.0000 mg | Freq: Four times a day (QID) | INTRAVENOUS | Status: DC | PRN

## 2022-12-03 MED ORDER — PROPOFOL 10 MG/ML IV BOLUS
INTRAVENOUS | Status: AC
  Filled 2022-12-03: qty 20

## 2022-12-03 MED ORDER — LEVOTHYROXINE SODIUM 25 MCG PO TABS: 125.0000 ug | ORAL_TABLET | ORAL | Status: DC

## 2022-12-03 MED ORDER — ACETAMINOPHEN 325 MG PO TABS: 325.0000 mg | ORAL_TABLET | Freq: Four times a day (QID) | ORAL | Status: DC | PRN

## 2022-12-03 MED ORDER — PHENYLEPHRINE 80 MCG/ML (10ML) SYRINGE FOR IV PUSH (FOR BLOOD PRESSURE SUPPORT)
PREFILLED_SYRINGE | INTRAVENOUS | Status: AC
  Filled 2022-12-03: qty 10

## 2022-12-03 MED ORDER — PROPOFOL 10 MG/ML IV BOLUS
INTRAVENOUS | Status: DC | PRN
  Administered 2022-12-03 (×2): 50 mg via INTRAVENOUS

## 2022-12-03 MED ORDER — LIDOCAINE 2% (20 MG/ML) 5 ML SYRINGE
INTRAMUSCULAR | Status: AC
  Filled 2022-12-03: qty 5

## 2022-12-03 MED ORDER — DEXAMETHASONE SODIUM PHOSPHATE 10 MG/ML IJ SOLN
INTRAMUSCULAR | Status: AC
  Filled 2022-12-03: qty 1

## 2022-12-03 MED ORDER — CEFAZOLIN SODIUM-DEXTROSE 2-4 GM/100ML-% IV SOLN
2.0000 g | INTRAVENOUS | Status: AC
  Administered 2022-12-03: 2 g via INTRAVENOUS
  Filled 2022-12-03: qty 100

## 2022-12-03 MED ORDER — CITALOPRAM HYDROBROMIDE 10 MG PO TABS
10.0000 mg | ORAL_TABLET | Freq: Every day | ORAL | Status: DC
  Administered 2022-12-04: 10 mg via ORAL
  Filled 2022-12-03: qty 1

## 2022-12-03 MED ORDER — AMISULPRIDE (ANTIEMETIC) 5 MG/2ML IV SOLN: 10.0000 mg | Freq: Once | INTRAVENOUS | Status: DC | PRN

## 2022-12-03 MED ORDER — SODIUM CHLORIDE 0.9 % IV SOLN: INTRAVENOUS | Status: DC | PRN

## 2022-12-03 MED ORDER — MELOXICAM 7.5 MG PO TABS
7.5000 mg | ORAL_TABLET | Freq: Every day | ORAL | Status: DC
  Administered 2022-12-04: 7.5 mg via ORAL
  Filled 2022-12-03: qty 1

## 2022-12-03 MED ORDER — POVIDONE-IODINE 7.5 % EX SOLN: Freq: Once | CUTANEOUS | Status: DC

## 2022-12-03 MED ORDER — EPHEDRINE 5 MG/ML INJ
INTRAVENOUS | Status: AC
  Filled 2022-12-03: qty 5

## 2022-12-03 MED ORDER — VITAMIN D 25 MCG (1000 UNIT) PO TABS: 1000.0000 [IU] | ORAL_TABLET | ORAL | Status: DC

## 2022-12-03 MED ORDER — ONDANSETRON HCL 4 MG/2ML IJ SOLN: 4.0000 mg | Freq: Four times a day (QID) | INTRAMUSCULAR | Status: DC | PRN

## 2022-12-03 MED ORDER — METOCLOPRAMIDE HCL 5 MG PO TABS: 5.0000 mg | ORAL_TABLET | Freq: Three times a day (TID) | ORAL | Status: DC | PRN

## 2022-12-03 MED ORDER — OXYCODONE HCL 5 MG PO TABS: 5.0000 mg | ORAL_TABLET | ORAL | Status: DC | PRN

## 2022-12-03 MED ORDER — LEVOTHYROXINE SODIUM 100 MCG PO TABS
100.0000 ug | ORAL_TABLET | ORAL | Status: DC
  Administered 2022-12-04: 100 ug via ORAL
  Filled 2022-12-03: qty 1

## 2022-12-03 MED ORDER — METHOCARBAMOL 500 MG PO TABS: 500.0000 mg | ORAL_TABLET | Freq: Four times a day (QID) | ORAL | Status: DC | PRN

## 2022-12-03 MED ORDER — PHENOL 1.4 % MT LIQD: 1.0000 | OROMUCOSAL | Status: DC | PRN

## 2022-12-03 MED ORDER — LIDOCAINE 2% (20 MG/ML) 5 ML SYRINGE
INTRAMUSCULAR | Status: DC | PRN
  Administered 2022-12-03: 40 mg via INTRAVENOUS

## 2022-12-03 MED ORDER — CHLORHEXIDINE GLUCONATE 0.12 % MT SOLN: 15.0000 mL | Freq: Once | OROMUCOSAL | Status: AC

## 2022-12-03 MED ORDER — 0.9 % SODIUM CHLORIDE (POUR BTL) OPTIME
TOPICAL | Status: DC | PRN
  Administered 2022-12-03: 4000 mL

## 2022-12-03 MED ORDER — EPHEDRINE SULFATE-NACL 50-0.9 MG/10ML-% IV SOSY
PREFILLED_SYRINGE | INTRAVENOUS | Status: DC | PRN
  Administered 2022-12-03 (×3): 5 mg via INTRAVENOUS

## 2022-12-03 MED ORDER — ACETAMINOPHEN 500 MG PO TABS
1000.0000 mg | ORAL_TABLET | Freq: Four times a day (QID) | ORAL | Status: AC
  Administered 2022-12-03 – 2022-12-04 (×3): 1000 mg via ORAL
  Filled 2022-12-03 (×4): qty 2

## 2022-12-03 MED ORDER — CEFAZOLIN SODIUM-DEXTROSE 2-4 GM/100ML-% IV SOLN
2.0000 g | Freq: Three times a day (TID) | INTRAVENOUS | Status: AC
  Administered 2022-12-03 – 2022-12-04 (×2): 2 g via INTRAVENOUS
  Filled 2022-12-03 (×3): qty 100

## 2022-12-03 MED ORDER — DEXAMETHASONE SODIUM PHOSPHATE 10 MG/ML IJ SOLN
INTRAMUSCULAR | Status: DC | PRN
  Administered 2022-12-03: 5 mg via INTRAVENOUS

## 2022-12-03 MED ORDER — DOCUSATE SODIUM 100 MG PO CAPS
100.0000 mg | ORAL_CAPSULE | Freq: Two times a day (BID) | ORAL | Status: DC
  Administered 2022-12-03 – 2022-12-04 (×2): 100 mg via ORAL
  Filled 2022-12-03 (×3): qty 1

## 2022-12-03 MED ORDER — SODIUM CHLORIDE 0.9 % IV SOLN: INTRAVENOUS | Status: AC

## 2022-12-03 MED ORDER — MORPHINE SULFATE (PF) 4 MG/ML IV SOLN
INTRAVENOUS | Status: AC
  Filled 2022-12-03: qty 2

## 2022-12-03 MED ORDER — CLONIDINE HCL (ANALGESIA) 100 MCG/ML EP SOLN
EPIDURAL | Status: AC
  Filled 2022-12-03: qty 10

## 2022-12-03 MED ORDER — FENTANYL CITRATE (PF) 100 MCG/2ML IJ SOLN: 25.0000 ug | INTRAMUSCULAR | Status: DC | PRN

## 2022-12-03 MED ORDER — BUPIVACAINE HCL (PF) 0.25 % IJ SOLN
INTRAMUSCULAR | Status: AC
  Filled 2022-12-03: qty 30

## 2022-12-03 MED ORDER — PHENYLEPHRINE HCL-NACL 20-0.9 MG/250ML-% IV SOLN
INTRAVENOUS | Status: DC | PRN
  Administered 2022-12-03: 50 ug/min via INTRAVENOUS

## 2022-12-03 MED ORDER — LEVOTHYROXINE SODIUM 100 MCG PO TABS: 100.0000 ug | ORAL_TABLET | ORAL | Status: DC

## 2022-12-03 MED ORDER — TRANEXAMIC ACID-NACL 1000-0.7 MG/100ML-% IV SOLN
1000.0000 mg | Freq: Once | INTRAVENOUS | Status: AC
  Administered 2022-12-03: 1000 mg via INTRAVENOUS
  Filled 2022-12-03 (×2): qty 100

## 2022-12-03 MED ORDER — TRANEXAMIC ACID-NACL 1000-0.7 MG/100ML-% IV SOLN
1000.0000 mg | INTRAVENOUS | Status: AC
  Administered 2022-12-03: 1000 mg via INTRAVENOUS
  Filled 2022-12-03: qty 100

## 2022-12-03 MED ORDER — PHENYLEPHRINE 80 MCG/ML (10ML) SYRINGE FOR IV PUSH (FOR BLOOD PRESSURE SUPPORT)
PREFILLED_SYRINGE | INTRAVENOUS | Status: DC | PRN
  Administered 2022-12-03: 80 ug via INTRAVENOUS
  Administered 2022-12-03: 160 ug via INTRAVENOUS
  Administered 2022-12-03: 240 ug via INTRAVENOUS
  Administered 2022-12-03: 160 ug via INTRAVENOUS

## 2022-12-03 MED ORDER — ONDANSETRON HCL 4 MG PO TABS: 4.0000 mg | ORAL_TABLET | Freq: Four times a day (QID) | ORAL | Status: DC | PRN

## 2022-12-03 MED ORDER — CHLORHEXIDINE GLUCONATE 0.12 % MT SOLN
OROMUCOSAL | Status: AC
  Administered 2022-12-03: 15 mL via OROMUCOSAL
  Filled 2022-12-03: qty 15

## 2022-12-03 SURGICAL SUPPLY — 49 items
BAG COUNTER SPONGE SURGICOUNT (BAG) ×1 IMPLANT
BNDG COHESIVE 4X5 TAN STRL (GAUZE/BANDAGES/DRESSINGS) IMPLANT
COVER SURGICAL LIGHT HANDLE (MISCELLANEOUS) ×1 IMPLANT
CUFF TOURN SGL QUICK 42 (TOURNIQUET CUFF) IMPLANT
DRAPE C-ARM 42X72 X-RAY (DRAPES) IMPLANT
DRAPE HALF SHEET 40X57 (DRAPES) IMPLANT
DRAPE INCISE IOBAN 66X45 STRL (DRAPES) IMPLANT
DRAPE ORTHO SPLIT 77X108 STRL (DRAPES) ×2
DRAPE SURG ORHT 6 SPLT 77X108 (DRAPES) IMPLANT
DRSG AQUACEL AG ADV 3.5X 4 (GAUZE/BANDAGES/DRESSINGS) IMPLANT
DRSG AQUACEL AG ADV 3.5X 6 (GAUZE/BANDAGES/DRESSINGS) IMPLANT
DRSG AQUACEL AG ADV 3.5X10 (GAUZE/BANDAGES/DRESSINGS) IMPLANT
DRSG TEGADERM 4X4.75 (GAUZE/BANDAGES/DRESSINGS) IMPLANT
GAUZE SPONGE 4X4 12PLY STRL (GAUZE/BANDAGES/DRESSINGS) IMPLANT
GLOVE BIOGEL PI IND STRL 8 (GLOVE) ×1 IMPLANT
GLOVE ECLIPSE 8.0 STRL XLNG CF (GLOVE) ×1 IMPLANT
GOWN STRL REUS W/ TWL LRG LVL3 (GOWN DISPOSABLE) ×3 IMPLANT
GOWN STRL REUS W/TWL LRG LVL3 (GOWN DISPOSABLE) ×3
GUIDE PIN 3.2X343 (PIN) ×2
GUIDE PIN 3.2X343MM (PIN) ×2
GUIDE ROD 3.0 (MISCELLANEOUS) ×1
K-WIRE 1.6 (WIRE) ×1
K-WIRE FX150X1.6XTROC PNT (WIRE) ×1
KIT BASIN OR (CUSTOM PROCEDURE TRAY) ×1 IMPLANT
KIT TURNOVER KIT B (KITS) ×1 IMPLANT
KWIRE FX150X1.6XTROC PNT (WIRE) IMPLANT
MANIFOLD NEPTUNE II (INSTRUMENTS) ×1 IMPLANT
NAIL EXTRACTOR DISP (INSTRUMENTS) IMPLANT
NAIL TRIGEN RIGHT 10X32MM (Nail) IMPLANT
NS IRRIG 1000ML POUR BTL (IV SOLUTION) ×1 IMPLANT
PACK GENERAL/GYN (CUSTOM PROCEDURE TRAY) ×1 IMPLANT
PAD ARMBOARD 7.5X6 YLW CONV (MISCELLANEOUS) ×2 IMPLANT
PIN GUIDE 3.2X343MM (PIN) IMPLANT
ROD GUIDE 3.0 (MISCELLANEOUS) IMPLANT
SCREW LAG COMPR KIT 80/75 (Screw) IMPLANT
STAPLER VISISTAT 35W (STAPLE) ×1 IMPLANT
STOCKINETTE IMPERVIOUS 9X36 MD (GAUZE/BANDAGES/DRESSINGS) IMPLANT
STRIP CLOSURE SKIN 1/2X4 (GAUZE/BANDAGES/DRESSINGS) IMPLANT
SUT ETHILON 3 0 PS 1 (SUTURE) IMPLANT
SUT ETHILON 4 0 FS 1 (SUTURE) IMPLANT
SUT MNCRL AB 3-0 PS2 18 (SUTURE) IMPLANT
SUT VIC AB 0 CT1 27 (SUTURE) ×2
SUT VIC AB 0 CT1 27XBRD ANBCTR (SUTURE) IMPLANT
SUT VIC AB 0 CT1 36 (SUTURE) IMPLANT
SUT VIC AB 2-0 CT1 27 (SUTURE) ×2
SUT VIC AB 2-0 CT1 TAPERPNT 27 (SUTURE) IMPLANT
TOWEL GREEN STERILE (TOWEL DISPOSABLE) ×1 IMPLANT
TOWEL GREEN STERILE FF (TOWEL DISPOSABLE) ×1 IMPLANT
WATER STERILE IRR 1000ML POUR (IV SOLUTION) ×1 IMPLANT

## 2022-12-03 NOTE — Transfer of Care (Signed)
Immediate Anesthesia Transfer of Care Note  Patient: Nancy [name]  Procedure(s) Performed: REMOVAL OF HARDWARE RIGHT LOWER EXTREMITY (Right)  Patient Location: PACU  Anesthesia Type:General  Level of Consciousness: drowsy  Airway & Oxygen Therapy: Patient Spontanous Breathing and Patient connected to face mask oxygen  Post-op Assessment: Report given to RN and Post -op Vital signs reviewed and stable  Post vital signs: Reviewed and stable  Last Vitals:  Vitals Value Taken Time  BP 103/54 12/03/22 1411  Temp    Pulse 65 12/03/22 1414  Resp 14 12/03/22 1414  SpO2 100 % 12/03/22 1414  Vitals shown include unvalidated device data.  Last Pain:  Vitals:   12/03/22 0926  TempSrc: Oral         Complications: No notable events documented.

## 2022-12-03 NOTE — H&P (Signed)
Nancy [name] is an 72 y.o. female.   Chief Complaint: Right knee pain HPI: Nancy [name] is a 72 year old patient with right knee pain following IMHS surgery for hip fracture over 3 months ago.  Patient has had hardware migration to the point where the inferior aspect of the nail has protruded close to the trochlea.  She has been having some difficulty with ambulation since that time along with some knee swelling.  Radiographs show significant callus formation around the proximal femur fracture with no screw cut out.  There has been some shortening of the right lower extremity.  She presents for removal of hardware to prevent further knee symptoms in a patient who despite her dementia is very ambulatory.  Past Medical History:  Diagnosis Date   Dementia ([place])    Hyperlipidemia    Hypertension    PONV (postoperative nausea and vomiting)     Past Surgical History:  Procedure Laterality Date   INTRAMEDULLARY (IM) NAIL INTERTROCHANTERIC Right 09/01/2022   Procedure: INTRAMEDULLARY (IM) NAIL INTERTROCHANTERIC;  Surgeon: Meredith Pel, MD;  Location: [facility];  Service: Orthopedics;  Laterality: Right;    History reviewed. No pertinent family history. Social History:  reports that she has never smoked. She has never used smokeless tobacco. She reports that she does not drink alcohol and does not use drugs.  Allergies:  Allergies  Allergen Reactions   Cefdinir Diarrhea and Other (See Comments)    Patient states this medication makes her "feel weird," as well as having diarrhea   No Known Allergies Per MAR* from [facility]     Medications Prior to Admission  Medication Sig Dispense Refill   acetaminophen (TYLENOL) 500 MG tablet Take 500 mg by mouth every 6 (six) hours as needed for mild pain, fever or headache.     aspirin EC 81 MG tablet Take 81 mg by mouth daily. Swallow whole.     citalopram (CELEXA) 10 MG tablet Take 10 mg by mouth daily.     divalproex (DEPAKOTE SPRINKLE) 125 MG  capsule Take 125 mg by mouth daily.     levothyroxine (SYNTHROID) 100 MCG tablet Take 100 mcg by mouth See admin instructions. Take 100 mcg by mouth daily ONLY on Sun Tues Wed Fri and Sat at 0800     levothyroxine (SYNTHROID) 125 MCG tablet Take 125 mcg by mouth See admin instructions. Take 125 mcg by mouth ONLY on Mon and Thurs at 0800     loperamide (IMODIUM) 2 MG capsule Take 2 mg by mouth every 3 (three) hours as needed for diarrhea or loose stools (with each loose stool- cannot exceed 8 doses/24 hours).     meloxicam (MOBIC) 7.5 MG tablet Take 7.5 mg by mouth daily.     OLANZapine (ZYPREXA) 5 MG tablet Take 5 mg by mouth 2 (two) times daily.     alum & mag hydroxide-simeth (MI-ACID) 200-200-20 MG/5ML suspension Take 30 mLs by mouth every 6 (six) hours as needed for indigestion or heartburn.     cholecalciferol (VITAMIN D3) 25 MCG (1000 UNIT) tablet Take 1,000 Units by mouth every Monday.     guaiFENesin (ROBITUSSIN) 100 MG/5ML liquid Take 10 mLs by mouth every 6 (six) hours as needed for cough.     magnesium hydroxide (MILK OF MAGNESIA) 400 MG/5ML suspension Take 30 mLs by mouth at bedtime as needed for mild constipation.     neomycin-bacitracin-polymyxin [phone] OINT Apply 1 Application topically as needed (to minor abrasions after cleaning with normal saline- cover with a  Band-Aid or Gauze & tape).      Results for orders placed or performed during the hospital encounter of 12/03/22 (from the past 48 hour(s))  CBC     Status: Abnormal   Collection Time: 12/03/22  9:58 AM  Result Value Ref Range   WBC 5.5 4.0 - 10.5 K/uL   RBC 4.17 3.87 - 5.11 MIL/uL   Hemoglobin 11.9 (L) 12.0 - 15.0 g/dL   HCT 38.7 36.0 - 46.0 %   MCV 92.8 80.0 - 100.0 fL   MCH 28.5 26.0 - 34.0 pg   MCHC 30.7 30.0 - 36.0 g/dL   RDW 14.5 11.5 - 15.5 %   Platelets 223 150 - 400 K/uL   nRBC 0.0 0.0 - 0.2 %    Comment: Performed at [facility], [place] [address] [place], [place] [address]   No results  found.  Review of Systems  Unable to perform ROS: Dementia    Blood pressure 128/71, pulse 87, temperature 98 F (36.7 C), temperature source Oral, resp. rate 18, height 5\' 2"  (1.575 m), weight 49 kg, SpO2 99 %. Physical Exam Vitals reviewed.  HENT:     Head: Normocephalic.     Nose: Nose normal.     Mouth/Throat:     Mouth: Mucous membranes are moist.  Cardiovascular:     Rate and Rhythm: Normal rate.     Pulses: Normal pulses.  Pulmonary:     Effort: Pulmonary effort is normal.  Abdominal:     General: Abdomen is flat.  Musculoskeletal:     Cervical back: Normal range of motion.  Skin:    General: Skin is warm.     Capillary Refill: Capillary refill takes less than 2 seconds.  Neurological:     Mental Status: She is alert. Mental status is at baseline.   Right lower extremity demonstrates mild right knee synovitis and swelling.  There is some pain with range of motion of the right knee.  Both knees have flexion contractures.  No groin pain with internal/external rotation of the leg.  All incisions are well-healed.  Assessment/Plan Impression is migration of hardware near the trochlea of the right knee.  Based on the amount of callus formation it appears that the fracture has healed but there has been shortening since original postop radiographs.  Plan is hardware removal and evaluation of fracture stability under fluoroscopy.  May potentially put a second shorter nail in place if there is any question about fracture stability.  Anderson Malta, MD 12/03/2022, 10:26 AM

## 2022-12-03 NOTE — Anesthesia Postprocedure Evaluation (Signed)
Anesthesia Post Note  Patient: Josslyn Ciolek  Procedure(s) Performed: REMOVAL OF HARDWARE RIGHT LOWER EXTREMITY (Right)     Patient location during evaluation: PACU Anesthesia Type: General Level of consciousness: awake and alert Pain management: pain level controlled Vital Signs Assessment: post-procedure vital signs reviewed and stable Respiratory status: spontaneous breathing, nonlabored ventilation, respiratory function stable and patient connected to nasal cannula oxygen Cardiovascular status: blood pressure returned to baseline and stable Postop Assessment: no apparent nausea or vomiting Anesthetic complications: no  No notable events documented.  Last Vitals:  Vitals:   12/03/22 1515 12/03/22 1530  BP: 118/62 138/65  Pulse: 84 85  Resp: 17 17  Temp:    SpO2: 100% 100%    Last Pain:  Vitals:   12/03/22 0926  TempSrc: Oral                 Kamillah Didonato L Jihaad Bruschi

## 2022-12-03 NOTE — Anesthesia Preprocedure Evaluation (Addendum)
Anesthesia Evaluation  Patient identified by MRN, date of birth, ID band Patient confused    Reviewed: Allergy & Precautions, NPO status , Patient's Chart, lab work & pertinent test results  History of Anesthesia Complications (+) PONV and history of anesthetic complications  Airway Mallampati: II  TM Distance: >3 FB Neck ROM: Full    Dental no notable dental hx.    Pulmonary neg pulmonary ROS   Pulmonary exam normal        Cardiovascular hypertension,  Rhythm:Regular Rate:Normal     Neuro/Psych       Dementia negative neurological ROS     GI/Hepatic negative GI ROS, Neg liver ROS,,,  Endo/Other  negative endocrine ROS    Renal/GU negative Renal ROS  negative genitourinary   Musculoskeletal RLE hardware discomfort   Abdominal Normal abdominal exam  (+)   Peds  Hematology  (+) Blood dyscrasia, anemia Lab Results      Component                Value               Date                      WBC                      8.9                 09/04/2022                HGB                      9.8 (L)             09/04/2022                HCT                      30.2 (L)            09/04/2022                MCV                      92.4                09/04/2022                PLT                      170                 09/04/2022              Anesthesia Other Findings   Reproductive/Obstetrics                             Anesthesia Physical Anesthesia Plan  ASA: 3  Anesthesia Plan: Spinal   Post-op Pain Management:    Induction: Intravenous  PONV Risk Score and Plan: 2 and Propofol infusion, Ondansetron, Dexamethasone and Treatment may vary due to age or medical condition  Airway Management Planned: Simple Face Mask, Natural Airway and Nasal Cannula  Additional Equipment: None  Intra-op Plan:   Post-operative Plan:   Informed Consent: I have reviewed the patients History and  Physical, chart, labs and discussed the procedure including the  risks, benefits and alternatives for the proposed anesthesia with the patient or authorized representative who has indicated his/her understanding and acceptance.     Dental advisory given and Consent reviewed with POA  Plan Discussed with: CRNA  Anesthesia Plan Comments: (Discussed anesthetic plan with POA son )       Anesthesia Quick Evaluation

## 2022-12-03 NOTE — Anesthesia Procedure Notes (Signed)
Procedure Name: LMA Insertion Date/Time: 12/03/2022 11:41 AM  Performed by: Darral Dash, DOPre-anesthesia Checklist: Patient identified, Emergency Drugs available, Suction available and Patient being monitored Patient Re-evaluated:Patient Re-evaluated prior to induction Oxygen Delivery Method: Circle system utilized Preoxygenation: Pre-oxygenation with 100% oxygen Induction Type: IV induction Ventilation: Mask ventilation without difficulty LMA: LMA inserted LMA Size: 3.0 Laser Tube: Cuffed inflated with minimal occlusive pressure - saline Placement Confirmation: positive ETCO2 and breath sounds checked- equal and bilateral Tube secured with: Tape Dental Injury: Teeth and Oropharynx as per pre-operative assessment

## 2022-12-03 NOTE — Anesthesia Procedure Notes (Signed)
Spinal  Patient location during procedure: OR Start time: 12/03/2022 11:35 AM End time: 12/03/2022 11:40 AM Staffing Performed: anesthesiologist  Anesthesiologist: Darral Dash, DO Performed by: Darral Dash, DO Authorized by: Darral Dash, DO   Preanesthetic Checklist Completed: patient identified, IV checked, site marked, risks and benefits discussed, surgical consent, monitors and equipment checked, pre-op evaluation and timeout performed Spinal Block Patient position: sitting Prep: DuraPrep Patient monitoring: heart rate, cardiac monitor, continuous pulse ox and blood pressure Approach: midline Location: L4-5 Injection technique: single-shot Needle Needle type: Pencan  Needle gauge: 24 G Needle length: 10 cm Additional Notes - Patient with advanced dementia, poor compliance, and 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 Will proceed with GA  Patient identified. Risks/Benefits/Options discussed with patient including but not limited to bleeding, infection, nerve damage, paralysis, failed block, incomplete pain control, headache, blood pressure changes, nausea, vomiting, reactions to medications, itching and postpartum back pain. Confirmed with bedside nurse the patient's most recent platelet count. Confirmed with patient that they are not currently taking any anticoagulation, have any bleeding history or any family history of bleeding disorders. Patient expressed understanding and wished to proceed. All questions were answered. Sterile technique was used throughout the entire procedure. Please see nursing notes for vital signs. Warning signs of high block given to the patient including shortness of breath, tingling/numbness in hands, complete motor block, or any concerning symptoms with instructions to call for help. Patient was given instructions on fall risk and not to get out of bed. All  questions and concerns addressed with instructions to call with any issues or inadequate analgesia.

## 2022-12-03 NOTE — Brief Op Note (Signed)
   12/03/2022  1:08 PM  PATIENT:  Nancy [name]  72 y.o. female  PRE-OPERATIVE DIAGNOSIS:  PROMINENT HARDWARE RIGHT LOWER EXTREMITY  POST-OPERATIVE DIAGNOSIS:  PROMINENT HARDWARE RIGHT LOWER EXTREMITY  PROCEDURE:  Procedure(s): REMOVAL OF HARDWARE RIGHT LOWER EXTREMITY exchange IMHS  SURGEON:  Surgeon(s): Marlou Sa, Tonna Corner, MD  ASSISTANT: Annie Main, PA  ANESTHESIA:   General  EBL: 75 ml    Total I/O In: 1000 [I.V.:1000] Out: 75 [Blood:75]  BLOOD ADMINISTERED: none  DRAINS: None  LOCAL MEDICATIONS USED: Marcaine morphine clonidine  SPECIMEN:  No Specimen  COUNTS:  YES  TOURNIQUET:  * No tourniquets in log *  DICTATION: .Other Dictation: Dictation Number [id number]  PLAN OF CARE: Admit for overnight observation  PATIENT DISPOSITION:  PACU - hemodynamically stable

## 2022-12-03 NOTE — Op Note (Unsigned)
Nancy [name], SWEARENGIN MEDICAL RECORD NO: [id number] ACCOUNT NO: [id number] DATE OF BIRTH: 1951-09-05 FACILITY: [facility] LOCATION: [facility] PHYSICIAN: Yetta Barre. Marlou Sa, MD  Operative Report   DATE OF PROCEDURE: 12/03/2022  PREOPERATIVE DIAGNOSIS:  Right knee hardware migration IMHS following a fall in the postop period.  POSTOPERATIVE DIAGNOSIS:  Right knee hardware migration IMHS following a fall in the postop period.  PROCEDURE:  Removal of [employer] IMHS with exchange nailing to a shorter nail.  SURGEON:  Yetta Barre. Marlou Sa, MD  ASSISTANT:  Annie Main.  INDICATIONS:  The patient is a 72 year old patient who is demented, but does ambulate.  She reports she had a fall in her immediate postop period following initial intertrochanteric fracture fixation about 4 months ago.  Since that time, she has had  shortening of the femur around the nail to the point where about 1.5 cm of the nail distally protruded into the knee, which is giving her discomfort.  She presents now for exchange nailing.  DESCRIPTION OF PROCEDURE:  The patient was brought to the operating room where general anesthetic was induced.  Preoperative antibiotics administered.  Timeout was called.  The patient was placed in lateral position.  Right leg was prescrubbed with  alcohol and Betadine, allowed to air dry, prepped with DuraPrep solution and draped in sterile manner.  Charlie Pitter was used to cover the operative field.  Timeout was called.  Incision made over the distal aspect of the compression and lag screw.  Skin and  subcutaneous tissue sharply divided.  Heterotopic bone was present.  The heterotopic bone was removed and excess bone graft was created by removing some of the callus in order to visualize the screw heads.  Both the compression screw and lag screw were  then removed.  No evidence of infection was present.  Next, we initially made an incision through the prior incision.  Unable to really get the correct angle in  order to go through heterotopic bone around the proximal trochanteric region.  Separate  incision was then made and the guide pin was placed through the heterotopic bone into the IMHS nail.  Proximal reaming was performed to get rid of the heterotopic bone and then the nail was removed.  At this time the leg was examined under fluoroscopy.   Fracture line was visible, but abundant callus formation was present and it was difficult to say with certainty whether or not complete healing had occurred.  For that reason, we placed a different 10 cm shorter nail 10 x 32 mm and placed a 80 mm lag  screw and 75 mm compression screw, which we then locked proximally to create a fixed angle device.  Next, thorough irrigation was performed on all incisions.  Bone grafting performed on the distal area where the lag and compression screw were removed.   Solution of Marcaine, morphine, clonidine injected into each incision.  The incisions were then closed using 0 Vicryl suture, 2-0 Vicryl suture, and 3-0 nylon with Aquacel dressings applied.  The patient tolerated the procedure well without immediate  complications, transferred to the recovery room in stable condition.  Luke's assistance was required for opening, closing, mobilization of tissue.  His assistance was a medical necessity.   PUS D: 12/03/2022 1:13:57 pm T: 12/03/2022 1:36:00 pm  JOB: [id number]/ [id number]

## 2022-12-04 DIAGNOSIS — T84092A Other mechanical complication of internal right knee prosthesis, initial encounter: Secondary | ICD-10-CM | POA: Diagnosis not present

## 2022-12-04 NOTE — Evaluation (Signed)
Physical Therapy Evaluation  Patient Details Name: Nancy [name] MRN: [id number] DOB: 1951-03-17 Today's Date: 12/04/2022  History of Present Illness  Pt is a 72 y/o female who presents s/p removal of IM nail from R femur and replacement with shorter nail on 12/03/22. She is now WBAT on the RLE. PMH significant for dementia, HTN, original IM nail R femur 09/01/22.   Clinical Impression  Pt admitted with above diagnosis. Pt currently with functional limitations due to the deficits listed below (see PT Problem List). At the time of PT eval pt able to tolerate transition to/from EOB with max-total assist. Cognition limiting mobility progression, but anticipate pt will need +2 assist for any attempts at OOB mobility. Pt will benefit from skilled PT to increase their independence and safety with mobility to allow discharge to the venue listed below.          Recommendations for follow up therapy are one component of a multi-disciplinary discharge planning process, led by the attending physician.  Recommendations may be updated based on patient status, additional functional criteria and insurance authorization.  Follow Up Recommendations Skilled nursing-short term rehab (<3 hours/day) Can patient physically be transported by private vehicle: No    Assistance Recommended at Discharge Frequent or constant Supervision/Assistance  Patient can return home with the following  Two people to help with walking and/or transfers;Two people to help with bathing/dressing/bathroom;Assistance with cooking/housework;Assist for transportation    Equipment Recommendations Other (comment) (TBD by next venue of care)  Recommendations for Other Services       Functional Status Assessment Patient has had a recent decline in their functional status and demonstrates the ability to make significant improvements in function in a reasonable and predictable amount of time.     Precautions / Restrictions  Precautions Precautions: Fall Precaution Comments: Advanced dementia from memory care Restrictions Weight Bearing Restrictions: Yes RLE Weight Bearing: Weight bearing as tolerated      Mobility  Bed Mobility Overal bed mobility: Needs Assistance Bed Mobility: Supine to Sit, Sit to Supine     Supine to sit: Max assist Sit to supine: Total assist   General bed mobility comments: Pt initiating LE movement towards EOB however with increased difficulty completing task due to decreased attention to task and following commands. Upon sitting EOB, pt with heavy posterior lean and unable to maintain sitting without max assist. Total assist required for return to supine.    Transfers                   General transfer comment: Unable to progress to OOB mobility at this time.    Ambulation/Gait                  Stairs            Wheelchair Mobility    Modified Rankin (Stroke Patients Only)       Balance Overall balance assessment: Needs assistance Sitting-balance support: Feet supported, Bilateral upper extremity supported   Sitting balance - Comments: Difficult to assess, actively pushing backwards when sitting EOB.                                     Pertinent Vitals/Pain Pain Assessment Pain Assessment: PAINAD Breathing: normal Negative Vocalization: occasional moan/groan, low speech, negative/disapproving quality Facial Expression: smiling or inexpressive Body Language: tense, distressed pacing, fidgeting Consolability: no need to console PAINAD Score: 2 Pain Location: RLE  Pain Descriptors / Indicators: Operative site guarding Pain Intervention(s): Limited activity within patient's tolerance, Monitored during session, Repositioned    Home Living Family/patient expects to be discharged to:: Skilled nursing facility                   Additional Comments: From [facility] memory care    Prior Function Prior Level  of Function : Needs assist             Mobility Comments: Per chart review pt able to ambulate however noted a personal wheelchair present in room. ADLs Comments: Per chart review staff assists with ADL's.     Hand Dominance   Dominant Hand: Right    Extremity/Trunk Assessment   Upper Extremity Assessment Upper Extremity Assessment: Defer to OT evaluation    Lower Extremity Assessment Lower Extremity Assessment: RLE deficits/detail RLE Deficits / Details: Somewhat guarding LE at times but moving it freely in the bed.    Cervical / Trunk Assessment Cervical / Trunk Assessment: Kyphotic  Communication      Cognition Arousal/Alertness: Awake/alert Behavior During Therapy: Restless, Anxious Overall Cognitive Status: History of cognitive impairments - at baseline                                          General Comments      Exercises     Assessment/Plan    PT Assessment Patient needs continued PT services  PT Problem List Decreased strength;Decreased range of motion;Decreased activity tolerance;Decreased balance;Decreased mobility;Decreased cognition;Decreased knowledge of use of DME;Decreased safety awareness;Decreased knowledge of precautions;Pain       PT Treatment Interventions DME instruction;Gait training;Functional mobility training;Therapeutic activities;Therapeutic exercise;Balance training;Patient/family education;Wheelchair mobility training    PT Goals (Current goals can be found in the Care Plan section)  Acute Rehab PT Goals Patient Stated Goal: None stated PT Goal Formulation: Patient unable to participate in goal setting Time For Goal Achievement: 12/18/22 Potential to Achieve Goals: Good    Frequency Min 2X/week     Co-evaluation               AM-PAC PT "6 Clicks" Mobility  Outcome Measure Help needed turning from your back to your side while in a flat bed without using bedrails?: A Lot Help needed moving from  lying on your back to sitting on the side of a flat bed without using bedrails?: A Lot Help needed moving to and from a bed to a chair (including a wheelchair)?: Total Help needed standing up from a chair using your arms (e.g., wheelchair or bedside chair)?: Total Help needed to walk in hospital room?: Total Help needed climbing 3-5 steps with a railing? : Total 6 Click Score: 8    End of Session   Activity Tolerance: Other (comment) (Limited by cognition) Patient left: in bed;with call bell/phone within reach;with bed alarm set (4 rails up for safety) Nurse Communication: Mobility status PT Visit Diagnosis: Other abnormalities of gait and mobility (R26.89);Difficulty in walking, not elsewhere classified (R26.2)    Time: 6063-0160 PT Time Calculation (min) (ACUTE ONLY): 14 min   Charges:   PT Evaluation $PT Eval Moderate Complexity: 1 Mod          Rolinda Roan, PT, DPT Acute Rehabilitation Services Secure Chat Preferred Office: [phone]   Thelma Comp 12/04/2022, 12:45 PM

## 2022-12-04 NOTE — Progress Notes (Signed)
Patient is POD 1 s/p removal of intramedullary nail from right femur with placement of shorter nail.  Fracture was healed on fluoroscopy at the time of surgery.  Radiographs of right femur following surgery look appropriate.  She has no complaints today but history is limited due to patient's history of dementia.  On exam, patient has no calf swelling or tenderness.  She has really no pain with logroll or manipulation of the right hip.  Incisions look to be healing well with dressings intact and no evidence of bleeding or drainage.  Plan is discharge back to her long-term rehab facility.  She is okay to be weightbearing as tolerated on the operative extremity with walker.  Follow-up with Dr. Marlou Sa in clinic in 2 weeks.

## 2022-12-04 NOTE — Plan of Care (Signed)
  Problem: Education: Goal: Knowledge of General Education information will improve Description: Including pain rating scale, medication(s)/side effects and non-pharmacologic comfort measures Outcome: Progressing   Problem: Clinical Measurements: Goal: Will remain free from infection Outcome: Progressing   Problem: Activity: Goal: Risk for activity intolerance will decrease Outcome: Progressing   Problem: Nutrition: Goal: Adequate nutrition will be maintained Outcome: Progressing   Problem: Coping: Goal: Level of anxiety will decrease Outcome: Progressing   Problem: Elimination: Goal: Will not experience complications related to bowel motility Outcome: Progressing   Problem: Pain Managment: Goal: General experience of comfort will improve Outcome: Progressing   

## 2022-12-04 NOTE — TOC Initial Note (Signed)
Transition of Care [facility]) - Initial/Assessment Note    Patient Details  Name: Nancy [name] MRN: [id number] Date of Birth: [birth year]  Transition of Care [facility]) CM/SW Contact:    Joanne Chars, LCSW Phone Number: 12/04/2022, 3:31 PM  Clinical Narrative:   CSW informed by PA that pt is ready to [facility].  CSW spoke with son Rodman Key regarding this and he does want pt to return to [facility].  Pt was at SNF recently and he does not want to pursue SNF again.  CSW spoke with Roselyn Reef at [facility] and pt can return.  PT eval still pending.  1315: PT eval recommending SNF.  CSW sent PT eval and DC summary by fax to [facility]. [phone]. They will review with PT rec, aware that pt requiring +2 for assistance.  1500: CSW spoke with both [place] and then [place] at [facility] and they are ready to receive pt at this time.  CSW spoke with son Rodman Key and updated him.  He again stated he does not want to pursue SNF.  Pt also receiving hospice services and they ended up paying for SNF out of pocket last time due to this.                  Expected Discharge Plan: Memory Care Barriers to Discharge: No Barriers Identified   Patient Goals and CMS Choice     Choice offered to / list presented to : Adult Children (son Rodman Key)      Expected Discharge Plan and Services In-house Referral: Clinical Social Work   Post Acute Care Choice: Resumption of Product/process development scientist (Memory Care at [facility]) Living arrangements for the past 2 months: [place] (Memory Care) Expected Discharge Date: 12/04/22                                    Prior Living Arrangements/Services Living arrangements for the past 2 months: [place] [place] [facility] (Memory Care) Lives with:: Facility Resident Patient language and need for interpreter reviewed:: No        Need for Family Participation in Patient Care: Yes (Comment) Care giver support system  in place?: Yes (comment) Current home services: Other (comment) (na) Criminal Activity/Legal Involvement Pertinent to Current Situation/Hospitalization: No - Comment as needed  Activities of Daily Living   ADL Screening (condition at time of admission) Patient's cognitive ability adequate to safely complete daily activities?: No Is the patient deaf or have difficulty hearing?: No Does the patient have difficulty seeing, even when wearing glasses/contacts?: Yes Does the patient have difficulty concentrating, remembering, or making decisions?: Yes Patient able to express need for assistance with ADLs?: No Does the patient have difficulty dressing or bathing?: Yes Independently performs ADLs?: No Communication: Dependent Is this a change from baseline?: Pre-admission baseline Dressing (OT): Dependent Is this a change from baseline?: Pre-admission baseline Grooming: Dependent Is this a change from baseline?: Pre-admission baseline Feeding: Dependent Is this a change from baseline?: Pre-admission baseline Bathing: Dependent Is this a change from baseline?: Pre-admission baseline Toileting: Dependent Is this a change from baseline?: Pre-admission baseline In/Out Bed: Dependent Is this a change from baseline?: Pre-admission baseline Walks in Home: Dependent Is this a change from baseline?: Pre-admission baseline Does the patient have difficulty walking or climbing stairs?: Yes Weakness of Legs: Both Weakness of Arms/Hands: Both  Permission Sought/Granted  Emotional Assessment Appearance:: Appears stated age Attitude/Demeanor/Rapport: Unable to Assess Affect (typically observed): Unable to Assess Orientation: :  (not oriented)      Admission diagnosis:  S/P hardware removal [Z98.890] Patient Active Problem List   Diagnosis Date Noted   S/P hardware removal 12/03/2022   Closed displaced intertrochanteric fracture of right femur (HCC)    Hypokalemia  09/03/2022   Acute postoperative anemia due to expected blood loss 09/03/2022   Protein-calorie malnutrition, severe 09/02/2022   Closed right hip fracture ([place]) 09/01/2022   SIRS (systemic inflammatory response syndrome) ([place]) 09/01/2022   Acute urinary retention 09/01/2022   Elevated LFTs 09/01/2022   Dysphagia 12/06/2021   Stage I pressure ulcer of sacral region 53/97/6734   Acute metabolic encephalopathy 19/37/9024   Aortic atherosclerosis ([place]) 12/06/2021   Pressure injury of skin 12/05/2021   Malnutrition of moderate degree 12/05/2021   Demand ischemia 12/04/2021   Thoracic compression fracture ([place]) 12/04/2021   Hyperlipidemia    Hypertension    Dementia ([place])    Thrombocytosis    Normocytic anemia    Mild protein malnutrition ([place])    PCP:  System, Provider Not In Pharmacy:   [facility] [place] [address] [place] [address] Phone: [phone] Fax: [phone]     Social Determinants of Health (SDOH) Social History: SDOH Screenings   Tobacco Use: Low Risk  (12/03/2022)   SDOH Interventions:     Readmission Risk Interventions    09/02/2022    1:07 PM  Readmission Risk Prevention Plan  Transportation Screening Complete  PCP or Specialist Appt within 5-7 Days Complete  Home Care Screening Complete  Medication Review (RN CM) Complete

## 2022-12-04 NOTE — Discharge Summary (Signed)
Physician Discharge Summary      Patient ID: Nancy [name] MRN: [id number] DOB/AGE: March 28, 1951 73 y.o.  Admit date: 12/03/2022 Discharge date: 12/04/2022  Admission Diagnoses:  Principal Problem:   S/P hardware removal   Discharge Diagnoses:  Same  Surgeries: Procedure(s): REMOVAL OF HARDWARE RIGHT LOWER EXTREMITY on 12/03/2022   Consultants:   Discharged Condition: Stable  Hospital Course: Nancy [name] is an 72 y.o. female who was admitted 12/03/2022 with a chief complaint of right leg prominent hardware, and found to have a diagnosis of right leg prominent hardware.  They were brought to the operating room on 12/03/2022 and underwent the above named procedures.  Pt awoke from anesthesia without complication and was transferred to the floor. On POD1, patient's pain is controlled.  Vital signs are stable and she appears well.  Requires 2+ assistance with mobilization Plan to discharge back to long-term care facility vs SNF.Marland Kitchen  Pt will f/u with Dr. Marlou Sa in clinic in ~2 weeks.   Antibiotics given:  Anti-infectives (From admission, onward)    Start     Dose/Rate Route Frequency Ordered Stop   12/03/22 1800  ceFAZolin (ANCEF) IVPB 2g/100 mL premix        2 g 200 mL/hr over 30 Minutes Intravenous Every 8 hours 12/03/22 1650 12/04/22 0620   12/03/22 1253  vancomycin (VANCOCIN) powder  Status:  Discontinued          As needed 12/03/22 1254 12/03/22 1408   12/03/22 0930  ceFAZolin (ANCEF) IVPB 2g/100 mL premix        2 g 200 mL/hr over 30 Minutes Intravenous On call to O.R. 12/03/22 0919 12/03/22 1147     .  Recent vital signs:  Vitals:   12/04/22 0442 12/04/22 0754  BP: (!) 105/59 107/66  Pulse: 97 (!) 105  Resp: 16 18  Temp: (!) 97.5 F (36.4 C) 99.3 F (37.4 C)  SpO2: 98% 98%    Recent laboratory studies:  Results for orders placed or performed during the hospital encounter of 12/03/22  CBC  Result Value Ref Range   WBC 5.5 4.0 - 10.5 K/uL   RBC 4.17 3.87 - 5.11 MIL/uL    Hemoglobin 11.9 (L) 12.0 - 15.0 g/dL   HCT 38.7 36.0 - 46.0 %   MCV 92.8 80.0 - 100.0 fL   MCH 28.5 26.0 - 34.0 pg   MCHC 30.7 30.0 - 36.0 g/dL   RDW 14.5 11.5 - 15.5 %   Platelets 223 150 - 400 K/uL   nRBC 0.0 0.0 - 0.2 %  Basic metabolic panel  Result Value Ref Range   Sodium 139 135 - 145 mmol/L   Potassium 4.6 3.5 - 5.1 mmol/L   Chloride 108 98 - 111 mmol/L   CO2 27 22 - 32 mmol/L   Glucose, Bld 88 70 - 99 mg/dL   BUN 13 8 - 23 mg/dL   Creatinine, Ser 0.60 0.44 - 1.00 mg/dL   Calcium 8.8 (L) 8.9 - 10.3 mg/dL   GFR, Estimated >60 >60 mL/min   Anion gap 4 (L) 5 - 15    Discharge Medications:   Allergies as of 12/04/2022       Reactions   Cefdinir Diarrhea, Other (See Comments)   Patient states this medication makes her "feel weird," as well as having diarrhea No Known Allergies Per MAR* from [place] these medications    acetaminophen 500 MG  tablet Commonly known as: TYLENOL Take 500 mg by mouth every 6 (six) hours as needed for mild pain, fever or headache.   aspirin EC 81 MG tablet Take 81 mg by mouth daily. Swallow whole.   cholecalciferol 25 MCG (1000 UNIT) tablet Commonly known as: VITAMIN D3 Take 1,000 Units by mouth every Monday.   citalopram 10 MG tablet Commonly known as: CELEXA Take 10 mg by mouth daily.   divalproex 125 MG capsule Commonly known as: DEPAKOTE SPRINKLE Take 125 mg by mouth daily.   guaiFENesin 100 MG/5ML liquid Commonly known as: ROBITUSSIN Take 10 mLs by mouth every 6 (six) hours as needed for cough.   levothyroxine 125 MCG tablet Commonly known as: SYNTHROID Take 125 mcg by mouth See admin instructions. Take 125 mcg by mouth ONLY on Mon and Thurs at 0800   levothyroxine 100 MCG tablet Commonly known as: SYNTHROID Take 100 mcg by mouth See admin instructions. Take 100 mcg by mouth daily ONLY on Sun Tues Wed Fri and Sat at 0800   loperamide 2 MG capsule Commonly known as:  IMODIUM Take 2 mg by mouth every 3 (three) hours as needed for diarrhea or loose stools (with each loose stool- cannot exceed 8 doses/24 hours).   magnesium hydroxide 400 MG/5ML suspension Commonly known as: MILK OF MAGNESIA Take 30 mLs by mouth at bedtime as needed for mild constipation.   meloxicam 7.5 MG tablet Commonly known as: MOBIC Take 7.5 mg by mouth daily.   Mi-Acid 200-200-20 MG/5ML suspension Generic drug: alum & mag hydroxide-simeth Take 30 mLs by mouth every 6 (six) hours as needed for indigestion or heartburn.   neomycin-bacitracin-polymyxin [phone] Oint Apply 1 Application topically as needed (to minor abrasions after cleaning with normal saline- cover with a Band-Aid or Gauze & tape).   OLANZapine 5 MG tablet Commonly known as: ZYPREXA Take 5 mg by mouth 2 (two) times daily.        Diagnostic Studies: DG FEMUR, MIN 2 VIEWS RIGHT  Result Date: 12/03/2022 CLINICAL DATA:  Status post hardware removal and placement of short intramedullary nail. EXAM: RIGHT FEMUR 2 VIEWS COMPARISON:  Right femur radiograph 11/04/2022 and 09/02/2022 FINDINGS: Interval removal of the prior long femoral intramedullary nail and interval placement of shorter intramedullary nail with two femoral neck screws again seen. The current intramedullary nail extends to the level of the distal femoral diaphysis. No perihardware lucency is seen to indicate hardware failure or loosening. There is again high-grade heterotopic bone formation and bone shortening in the region of a subacute fracture of the proximal right femoral neck. This is new from 09/02/2022. Moderate to severe right femoroacetabular joint space narrowing. 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 IMPRESSION: Interval removal of the prior long femoral intramedullary nail and interval placement of shorter intramedullary nail. No evidence of hardware failure. Electronically Signed   By: Yvonne Kendall M.D.   On:  12/03/2022 15:20   DG FEMUR, MIN 2 VIEWS RIGHT  Result Date: 12/03/2022 CLINICAL DATA:  Moderate removal of intramedullary nail. Placement of new shorter intramedullary nail. EXAM: RIGHT FEMUR 2 VIEWS COMPARISON:  Right femur radiographs 11/04/2022 FINDINGS: Images were performed intraoperatively without the presence of a radiologist. On the first image, the prior intramedullary nail has been removed. A new shorter intramedullary nail is seen placed within the right femur which no longer extends past the distal femoral condyles but rather terminates within the distal femoral diaphysis. Total fluoroscopy images: 7 Total fluoroscopy time: 133 seconds Total  dose: Radiation Exposure Index (as provided by the fluoroscopic device): 21.75 mGy air Kerma Please see intraoperative findings for further detail. IMPRESSION: Fluoroscopic assistance for removal of femoral intramedullary nail and placement of a shorter intramedullary nail. Electronically Signed   By: Neita Garnet M.D.   On: 12/03/2022 13:44   DG C-Arm 1-60 Min-No Report  Result Date: 12/03/2022 Fluoroscopy was utilized by the requesting physician.  No radiographic interpretation.   DG C-Arm 1-60 Min-No Report  Result Date: 12/03/2022 Fluoroscopy was utilized by the requesting physician.  No radiographic interpretation.   XR FEMUR, MIN 2 VIEWS RIGHT  Result Date: 11/05/2022 AP lateral radiographs right femur reviewed.  Abundant heterotopic ossification is present around the proximal femur fracture.  There has been shortening of the fracture.  In comparison to immediate postop radiographs there has been distal migration of the intramedullary nail to about 1/2 cm below Blumensaat's line into the knee.  No acute fracture around the knee.  This is a change compared to immediate postop radiographs.   Disposition: Discharge disposition: 03-Skilled Nursing Facility       Discharge Instructions     Call MD / Call 911   Complete by: As directed     If you experience chest pain or shortness of breath, CALL 911 and be transported to the hospital emergency room.  If you develope a fever above 101 F, pus (white drainage) or increased drainage or redness at the wound, or calf pain, call your surgeon's office.   Constipation Prevention   Complete by: As directed    Drink plenty of fluids.  Prune juice may be helpful.  You may use a stool softener, such as Colace (over the counter) 100 mg twice a day.  Use MiraLax (over the counter) for constipation as needed.   Diet - low sodium heart healthy   Complete by: As directed    Increase activity slowly as tolerated   Complete by: As directed    Post-operative opioid taper instructions:   Complete by: As directed    POST-OPERATIVE OPIOID TAPER INSTRUCTIONS: It is important to wean off of your opioid medication as soon as possible. If you do not need pain medication after your surgery it is ok to stop day one. Opioids include: Codeine, Hydrocodone(Norco, Vicodin), Oxycodone(Percocet, oxycontin) and hydromorphone amongst others.  Long term and even short term use of opiods can cause: Increased pain response Dependence Constipation Depression Respiratory depression And more.  Withdrawal symptoms can include Flu like symptoms Nausea, vomiting And more Techniques to manage these symptoms Hydrate well Eat regular healthy meals Stay active Use relaxation techniques(deep breathing, meditating, yoga) Do Not substitute Alcohol to help with tapering If you have been on opioids for less than two weeks and do not have pain than it is ok to stop all together.  Plan to wean off of opioids This plan should start within one week post op of your joint replacement. Maintain the same interval or time between taking each dose and first decrease the dose.  Cut the total daily intake of opioids by one tablet each day Next start to increase the time between doses. The last dose that should be eliminated is the  evening dose.             SignedKarenann Cai 12/04/2022, 8:40 AM  \

## 2022-12-04 NOTE — Progress Notes (Signed)
Report given to staff nurse at [facility], all questions and concerns were fully addressed.

## 2022-12-04 NOTE — TOC Transition Note (Signed)
Transition of Care [facility]) - CM/SW Discharge Note   Patient Details  Name: Yuma Blucher MRN: [id number] Date of Birth: 1950/12/10  Transition of Care [facility]) CM/SW Contact:  Joanne Chars, LCSW Phone Number: 12/04/2022, 3:39 PM   Clinical Narrative:  Pt discharging to [facility] memory care, room 401.  RN call report to [phone].       Final next level of care: Memory Care Barriers to Discharge: No Barriers Identified   Patient Goals and CMS Choice   Choice offered to / list presented to : Adult Children (son Rodman Key)  Discharge Placement                Patient chooses bed at:  [facility] memory care) Patient to be transferred to facility by: [place] Name of family member notified: son Rodman Key Patient and family notified of of transfer: 12/04/22  Discharge Plan and Services Additional resources added to the After Visit Summary for   In-house Referral: Clinical Social Work   Post Acute Care Choice: Resumption of Svcs/PTA Provider (Memory Care at [facility])                               Social Determinants of Health (SDOH) Interventions SDOH Screenings   Tobacco Use: Low Risk  (12/03/2022)     Readmission Risk Interventions    09/02/2022    1:07 PM  Readmission Risk Prevention Plan  Transportation Screening Complete  PCP or Specialist Appt within 5-7 Days Complete  Home Care Screening Complete  Medication Review (RN CM) Complete

## 2022-12-05 ENCOUNTER — Telehealth: Payer: Self-pay | Admitting: Orthopedic Surgery

## 2022-12-05 ENCOUNTER — Encounter (HOSPITAL_COMMUNITY): Payer: Self-pay | Admitting: Orthopedic Surgery

## 2022-12-05 NOTE — Telephone Encounter (Signed)
Petra Kuba (PT Coordinator) from [facility] called requesting instructions and physical therapy orders with weight barring status ect.... Pt had surgery and pt was brought in with no instructions. Please call Tasha at [phone]

## 2022-12-05 NOTE — Telephone Encounter (Signed)
I called and left VM.

## 2022-12-06 ENCOUNTER — Telehealth: Payer: Self-pay | Admitting: Orthopedic Surgery

## 2022-12-06 NOTE — Telephone Encounter (Signed)
Larkin Ina (PT) from [facility] rehab called for orders for weight barring status on pt. Please call Larkin Ina at [phone].

## 2022-12-06 NOTE — Telephone Encounter (Signed)
Called and discussed

## 2022-12-21 DIAGNOSIS — T85848A Pain due to other internal prosthetic devices, implants and grafts, initial encounter: Secondary | ICD-10-CM

## 2023-09-07 ENCOUNTER — Emergency Department (HOSPITAL_COMMUNITY)
Admission: EM | Admit: 2023-09-07 | Discharge: 2023-09-07 | Disposition: A | Discharge status: Home / Self Care | Attending: Emergency Medicine | Admitting: Emergency Medicine

## 2023-09-07 ENCOUNTER — Other Ambulatory Visit: Payer: Self-pay

## 2023-09-07 ENCOUNTER — Emergency Department (HOSPITAL_COMMUNITY)

## 2023-09-07 DIAGNOSIS — F0390 Unspecified dementia without behavioral disturbance: Secondary | ICD-10-CM | POA: Diagnosis not present

## 2023-09-07 DIAGNOSIS — Z79899 Other long term (current) drug therapy: Secondary | ICD-10-CM | POA: Insufficient documentation

## 2023-09-07 DIAGNOSIS — I10 Essential (primary) hypertension: Secondary | ICD-10-CM | POA: Diagnosis not present

## 2023-09-07 DIAGNOSIS — R0989 Other specified symptoms and signs involving the circulatory and respiratory systems: Secondary | ICD-10-CM | POA: Diagnosis present

## 2023-09-07 DIAGNOSIS — Z7982 Long term (current) use of aspirin: Secondary | ICD-10-CM | POA: Diagnosis not present

## 2023-09-07 DIAGNOSIS — T17308A Unspecified foreign body in larynx causing other injury, initial encounter: Secondary | ICD-10-CM

## 2023-09-07 LAB — BASIC METABOLIC PANEL
Anion gap: 7 (ref 5–15)
BUN: 19 mg/dL (ref 8–23)
CO2: 29 mmol/L (ref 22–32)
Calcium: 8 mg/dL — ABNORMAL LOW (ref 8.9–10.3)
Chloride: 109 mmol/L (ref 98–111)
Creatinine, Ser: 0.76 mg/dL (ref 0.44–1.00)
GFR, Estimated: >60 mL/min (ref >60)
Glucose, Bld: 101 mg/dL — ABNORMAL HIGH (ref 70–99)
Potassium: 3.4 mmol/L — ABNORMAL LOW (ref 3.5–5.1)
Sodium: 145 mmol/L (ref 135–145)

## 2023-09-07 LAB — CBC WITH DIFFERENTIAL/PLATELET
Abs Immature Granulocytes: 0.25 10*3/uL — ABNORMAL HIGH (ref 0.00–0.07)
Basophils Absolute: 0 10*3/uL (ref 0.0–0.1)
Basophils Relative: 0 %
Eosinophils Absolute: 0.1 10*3/uL (ref 0.0–0.5)
Eosinophils Relative: 2 %
HCT: 31.7 % — ABNORMAL LOW (ref 36.0–46.0)
Hemoglobin: 9.8 g/dL — ABNORMAL LOW (ref 12.0–15.0)
Immature Granulocytes: 3 %
Lymphocytes Relative: 20 %
Lymphs Abs: 1.8 10*3/uL (ref 0.7–4.0)
MCH: 28.9 pg (ref 26.0–34.0)
MCHC: 30.9 g/dL (ref 30.0–36.0)
MCV: 93.5 fL (ref 80.0–100.0)
Monocytes Absolute: 0.3 10*3/uL (ref 0.1–1.0)
Monocytes Relative: 4 %
Neutro Abs: 6.3 10*3/uL (ref 1.7–7.7)
Neutrophils Relative %: 71 %
Platelets: 292 10*3/uL (ref 150–400)
RBC: 3.39 MIL/uL — ABNORMAL LOW (ref 3.87–5.11)
RDW: 16.1 % — ABNORMAL HIGH (ref 11.5–15.5)
WBC: 8.8 10*3/uL (ref 4.0–10.5)
nRBC: 0 % (ref 0.0–0.2)

## 2023-09-07 LAB — CBG MONITORING, ED: Glucose-Capillary: 78 mg/dL (ref 70–99)

## 2023-09-07 MED ORDER — AMOXICILLIN-POT CLAVULANATE 875-125 MG PO TABS: 1.0000 | ORAL_TABLET | Freq: Two times a day (BID) | ORAL | 0 refills | Status: AC

## 2023-09-07 MED ORDER — OLANZAPINE 5 MG PO TBDP
5.0000 mg | ORAL_TABLET | Freq: Once | ORAL | Status: AC
  Administered 2023-09-07: 5 mg via ORAL
  Filled 2023-09-07: qty 1

## 2023-09-07 NOTE — ED Provider Notes (Signed)
[facility] EMERGENCY DEPARTMENT AT [facility] Provider Note   CSN: [id number] Arrival date & time: 09/07/23  1245     History  Chief Complaint  Patient presents with   Aspiration   Post choking    Nancy [name] is a 72 y.o. female.  Patient is a 72 year old female with a history of hypertension, hyperlipidemia and dementia who is presenting today with EMS after a choking episode where she was reported to have lost pulses.  Patient was sitting in the dining room eating ham when she suddenly started choking with perioral cyanosis and unresponsiveness.  Staff did the Heimlich maneuver but then reported that patient was unresponsive and did not have a pulse.  When EMS arrived they had started 1 cycle of CPR but when fire got there patient woke up.  She initially was on 15 L of oxygen but EMS reported and route sats were okay but then off oxygen her O2 dropped to 90% so they placed her back on 6 L.  They noted that she had rhonchi in all lung fields but otherwise blood pressure and pulse were normal.  Patient at this time denies any pain or trouble breathing.  She is awake and alert.  The history is provided by the EMS personnel and medical records.       Home Medications Prior to Admission medications   Medication Sig Start Date End Date Taking? Authorizing Provider  acetaminophen (TYLENOL) 500 MG tablet Take 500 mg by mouth every 6 (six) hours as needed for mild pain, fever or headache.   Yes [provider]  acetaminophen (TYLENOL) 500 MG tablet Take 500 mg by mouth 3 (three) times daily.   Yes [provider]  acetaminophen (TYLENOL) 650 MG suppository Place 650 mg rectally every 6 (six) hours as needed for moderate pain.   Yes [provider]  amoxicillin-clavulanate (AUGMENTIN) 875-125 MG tablet Take 1 tablet by mouth every 12 (twelve) hours. 09/07/23  Yes Gwyneth Sprout, MD  aspirin EC 81 MG tablet Take 81 mg by mouth daily. Swallow whole.    Yes [provider]  cholecalciferol (VITAMIN D3) 25 MCG (1000 UNIT) tablet Take 1,000 Units by mouth every Monday.   Yes [provider]  citalopram (CELEXA) 10 MG tablet Take 10 mg by mouth daily. 07/20/20  Yes [provider]  divalproex (DEPAKOTE SPRINKLE) 125 MG capsule Take 125 mg by mouth daily.   Yes [provider]  levothyroxine (SYNTHROID) 100 MCG tablet Take 100 mcg by mouth See admin instructions. Take 100 mcg by mouth daily ONLY on Sun Tues Wed Fri and Sat at 0800 11/07/22  Yes [provider]  levothyroxine (SYNTHROID) 125 MCG tablet Take 125 mcg by mouth See admin instructions. Take 125 mcg by mouth ONLY on Mon and Thurs at 0800 11/07/22  Yes [provider]  loperamide (IMODIUM) 2 MG capsule Take 2 mg by mouth every 6 (six) hours as needed for diarrhea or loose stools.   Yes [provider]  meloxicam (MOBIC) 7.5 MG tablet Take 7.5 mg by mouth daily. 11/27/22  Yes [provider]  OLANZapine (ZYPREXA) 5 MG tablet Take 5 mg by mouth 2 (two) times daily. 08/17/20  Yes [provider]  traMADol (ULTRAM) 50 MG tablet Take 50 mg by mouth 2 (two) times daily as needed for moderate pain.   Yes [provider]  traZODone (DESYREL) 50 MG tablet Take 50 mg by mouth at bedtime. 08/04/23  Yes [provider]      Allergies    Cefdinir    Review of Systems   Review of Systems  Physical Exam Updated Vital Signs BP (!) 149/71   Pulse 97   Temp 98.3 F (36.8 C) (Tympanic)   Resp 19   LMP  (LMP Unknown)   SpO2 100%  Physical Exam Vitals and nursing note reviewed.  Constitutional:      General: She is not in acute distress.    Appearance: She is well-developed and underweight.  HENT:     Head: Normocephalic and atraumatic.     Mouth/Throat:     Mouth: Mucous membranes are moist.  Eyes:     Pupils: Pupils are equal, round, and reactive to light.  Cardiovascular:     Rate and Rhythm:  Normal rate and regular rhythm.     Heart sounds: Normal heart sounds. No murmur heard.    No friction rub.  Pulmonary:     Effort: Pulmonary effort is normal.     Breath sounds: Rhonchi present. No wheezing or rales.  Abdominal:     General: Bowel sounds are normal. There is no distension.     Palpations: Abdomen is soft.     Tenderness: There is no abdominal tenderness. There is no guarding or rebound.  Musculoskeletal:        General: No tenderness. Normal range of motion.     Right lower leg: No edema.     Left lower leg: No edema.     Comments: No edema.  Feet are cold bilaterally with some mild cyanosis but dopplered DP pulses bilaterally  Skin:    General: Skin is warm and dry.     Findings: No rash.  Neurological:     Mental Status: She is alert. Mental status is at baseline.     Cranial Nerves: No cranial nerve deficit.     Comments: Oriented to person.  Contractures noted in the lower extremities.  Able to move upper extremities.  Psychiatric:     Comments: Cooperative     ED Results / Procedures / Treatments   Labs (all labs ordered are listed, but only abnormal results are displayed) Labs Reviewed  CBC WITH DIFFERENTIAL/PLATELET - Abnormal; Notable for the following components:      Result Value   RBC 3.39 (*)    Hemoglobin 9.8 (*)    HCT 31.7 (*)    RDW 16.1 (*)    Abs Immature Granulocytes 0.25 (*)    All other components within normal limits  BASIC METABOLIC PANEL - Abnormal; Notable for the following components:   Potassium 3.4 (*)    Glucose, Bld 101 (*)    Calcium 8.0 (*)    All other components within normal limits  CBG MONITORING, ED    EKG EKG Interpretation Date/Time:  Sunday September 07 2023 12:54:49 EDT Ventricular Rate:  92 PR Interval:  197 QRS Duration:  91 QT Interval:  373 QTC Calculation: 462 R Axis:   62  Text Interpretation: Sinus rhythm new Probable LVH with secondary repol abnrm Confirmed by Gwyneth Sprout ([id number]) on  09/07/2023 1:06:40 PM  Radiology DG Chest Port 1 View  Result Date: 09/07/2023 CLINICAL DATA:  Hypoxic.  Choking feeling. EXAM: PORTABLE CHEST 1 VIEW COMPARISON:  X-ray 09/01/2022 FINDINGS: Hyperinflation. No pneumothorax. Normal cardiopericardial silhouette. Calcified aorta. Mild opacity left lung base with a tiny left effusion. Osteopenia and degenerative changes. Multiple old right-sided rib fractures. Rotated tilted x-ray. Overlapping cardiac leads. IMPRESSION: Tiny left  effusion with some adjacent opacity. Infiltrate is possible. Recommend follow-up Electronically Signed   By: Karen Kays M.D.   On: 09/07/2023 15:06    Procedures Procedures    Medications Ordered in ED Medications - No data to display  ED Course/ Medical Decision Making/ A&P                                 Medical Decision Making Amount and/or Complexity of Data Reviewed Independent Historian: EMS External Data Reviewed: notes. Labs: ordered. Decision-making details documented in ED Course. Radiology: ordered and independent interpretation performed. Decision-making details documented in ED Course. ECG/medicine tests: ordered and independent interpretation performed. Decision-making details documented in ED Course.  Risk Prescription drug management.   Pt with multiple medical problems and comorbidities and presenting today with a complaint that caries a high risk for morbidity and mortality.  Here today after a choking episode at [facility] house.  It was noted that patient lost consciousness and pulses and received 1 cycle of chest compressions prior to fires arrival.  However patient has been awake since this time.  Patient has no focal findings on exam here except for some rhonchi on lung exam.  She was removed from oxygen and sats have been greater than 90% on room air.  Concern for aspiration as cause of her symptoms today.  3:27 PM I independently interpreted patient's EKG and labs.  EKG without acute  findings.  CBC, BMP without acute findings.  I have independently visualized and interpreted pt's images today.  Chest x-ray without evidence of obvious fracture.  Radiology reports tiny left effusion with some adjacent opacity.  Given the concern for possible aspiration will cover patient with Augmentin but at this time she is eating and drinking.  She is trying to crawl out of bed with.  Spoke with patient's son Molli Hazard and he reports that is fairly typical but she has been wheelchair-bound because she is not a candidate for surgery.  He does also agree that patient is a DNR at this time.  At this time stable for discharge back to the facility.          Final Clinical Impression(s) / ED Diagnoses Final diagnoses:  Choking, initial encounter    Rx / DC Orders ED Discharge Orders          Ordered    amoxicillin-clavulanate (AUGMENTIN) 875-125 MG tablet  Every 12 hours        09/07/23 1525              Gwyneth Sprout, MD 09/07/23 1527

## 2023-09-07 NOTE — Discharge Instructions (Addendum)
X-ray without broken ribs today.  Labs and oxygen normal here.  Did discuss with Molli Hazard pt's son and pt is DNR for the future.  Started on augmentin due to possible aspiration with choking.

## 2023-09-07 NOTE — ED Triage Notes (Addendum)
Pt Nancy [name] from [facility]. Pt starting choking on a piece of food. Nursing facility staff starting performing the heimlich maneuver and pt became unresponsive. Nursing staff did one round of compressions and after one round pt became alert and responsive. EMS states they never saw the food come back up. EMS placed on 15L [place] and pt arrived on 6L [place]. Pt is a hospice patient. Hx of dementia and hyperlipidemia. Pt is alert and able to answer questions. GCS 14  EMS VS 82 P 98% O2 6LNC  148/78 BP 140 CBG 20 LHand

## 2023-09-07 NOTE — ED Notes (Signed)
Pt tolerated a cup of juice.

## 2023-09-07 NOTE — ED Notes (Signed)
Pt constantly trying to climb out of bed. Fall alarm in place. Seizure pads in place as pt has been attempting to get out of bed. MD is aware and was at bedside. Pt has been redirected by staff multiple times with no success. Will continue to monitor.

## 2023-12-03 DEATH — deceased
